# Patient Record
Sex: Male | Born: 1940 | Race: Black or African American | Hispanic: No | Marital: Married | Smoking: Former smoker
Health system: Southern US, Community
[De-identification: ages and names within clinical notes are randomized; demographics above are authoritative.]

## PROBLEM LIST (undated history)

## (undated) DIAGNOSIS — I5022 Chronic systolic (congestive) heart failure: Secondary | ICD-10-CM

## (undated) DIAGNOSIS — M7030 Other bursitis of elbow, unspecified elbow: Secondary | ICD-10-CM

## (undated) DIAGNOSIS — C61 Malignant neoplasm of prostate: Secondary | ICD-10-CM

## (undated) DIAGNOSIS — E669 Obesity, unspecified: Secondary | ICD-10-CM

## (undated) DIAGNOSIS — E119 Type 2 diabetes mellitus without complications: Secondary | ICD-10-CM

## (undated) DIAGNOSIS — I428 Other cardiomyopathies: Secondary | ICD-10-CM

## (undated) DIAGNOSIS — E785 Hyperlipidemia, unspecified: Secondary | ICD-10-CM

## (undated) DIAGNOSIS — I472 Ventricular tachycardia: Secondary | ICD-10-CM

## (undated) DIAGNOSIS — M1990 Unspecified osteoarthritis, unspecified site: Secondary | ICD-10-CM

## (undated) DIAGNOSIS — J984 Other disorders of lung: Secondary | ICD-10-CM

## (undated) DIAGNOSIS — I340 Nonrheumatic mitral (valve) insufficiency: Secondary | ICD-10-CM

## (undated) DIAGNOSIS — Z86718 Personal history of other venous thrombosis and embolism: Secondary | ICD-10-CM

## (undated) DIAGNOSIS — I10 Essential (primary) hypertension: Secondary | ICD-10-CM

## (undated) HISTORY — DX: Malignant neoplasm of prostate: C61

## (undated) HISTORY — DX: Chronic systolic (congestive) heart failure: I50.22

## (undated) HISTORY — DX: Hyperlipidemia, unspecified: E78.5

## (undated) HISTORY — DX: Personal history of other venous thrombosis and embolism: Z86.718

## (undated) HISTORY — PX: TRANSURETHRAL RESECTION OF PROSTATE: SHX73

## (undated) HISTORY — PX: HERNIA REPAIR: SHX51

## (undated) HISTORY — DX: Other cardiomyopathies: I42.8

## (undated) HISTORY — DX: Essential (primary) hypertension: I10

## (undated) HISTORY — DX: Obesity, unspecified: E66.9

## (undated) HISTORY — DX: Nonrheumatic mitral (valve) insufficiency: I34.0

## (undated) HISTORY — DX: Unspecified osteoarthritis, unspecified site: M19.90

## (undated) HISTORY — DX: Other bursitis of elbow, unspecified elbow: M70.30

## (undated) HISTORY — DX: Other disorders of lung: J98.4

---

## 1999-07-19 ENCOUNTER — Encounter: Payer: Self-pay | Admitting: Family Medicine

## 1999-07-19 ENCOUNTER — Encounter: Admission: RE | Admit: 1999-07-19 | Discharge: 1999-07-19 | Payer: Self-pay | Admitting: Family Medicine

## 2006-10-30 ENCOUNTER — Ambulatory Visit: Admission: RE | Admit: 2006-10-30 | Discharge: 2007-01-03 | Payer: Self-pay | Admitting: Radiation Oncology

## 2006-11-18 HISTORY — PX: ROBOT ASSISTED LAPAROSCOPIC RADICAL PROSTATECTOMY: SHX5141

## 2006-12-03 ENCOUNTER — Encounter (INDEPENDENT_AMBULATORY_CARE_PROVIDER_SITE_OTHER): Payer: Self-pay | Admitting: Urology

## 2006-12-03 ENCOUNTER — Inpatient Hospital Stay (HOSPITAL_COMMUNITY): Admission: RE | Admit: 2006-12-03 | Discharge: 2006-12-04 | Payer: Self-pay | Admitting: Urology

## 2007-01-09 ENCOUNTER — Ambulatory Visit: Payer: Self-pay | Admitting: Cardiology

## 2007-01-22 ENCOUNTER — Ambulatory Visit: Payer: Self-pay

## 2007-01-22 ENCOUNTER — Encounter: Payer: Self-pay | Admitting: Cardiology

## 2007-01-25 ENCOUNTER — Ambulatory Visit: Payer: Self-pay | Admitting: Cardiology

## 2007-01-30 ENCOUNTER — Ambulatory Visit: Payer: Self-pay | Admitting: Cardiology

## 2007-01-30 ENCOUNTER — Ambulatory Visit: Payer: Self-pay

## 2007-01-30 LAB — CONVERTED CEMR LAB
Albumin: 3.6 g/dL (ref 3.5–5.2)
GFR calc non Af Amer: 54 mL/min
HDL: 51.7 mg/dL (ref >39.0)
LDL Cholesterol: 83 mg/dL (ref 0–99)
Potassium: 4.1 meq/L (ref 3.5–5.1)
Pro B Natriuretic peptide (BNP): 344 pg/mL — ABNORMAL HIGH (ref 0.0–100.0)
Sodium: 144 meq/L (ref 135–145)
TSH: 1.65 microintl units/mL (ref 0.35–5.50)
Total Bilirubin: 1.9 mg/dL — ABNORMAL HIGH (ref 0.3–1.2)
Triglycerides: 56 mg/dL (ref 0–149)
VLDL: 11 mg/dL (ref 0–40)

## 2007-02-20 ENCOUNTER — Ambulatory Visit: Payer: Self-pay | Admitting: Cardiology

## 2007-03-15 ENCOUNTER — Ambulatory Visit: Payer: Self-pay | Admitting: Cardiology

## 2007-03-15 LAB — CONVERTED CEMR LAB
Chloride: 109 meq/L (ref 96–112)
GFR calc Af Amer: 78 mL/min
GFR calc non Af Amer: 65 mL/min
Sodium: 142 meq/L (ref 135–145)

## 2007-05-29 ENCOUNTER — Ambulatory Visit: Payer: Self-pay | Admitting: Cardiology

## 2007-06-04 ENCOUNTER — Ambulatory Visit: Payer: Self-pay | Admitting: Cardiology

## 2007-06-04 LAB — CONVERTED CEMR LAB
BUN: 26 mg/dL — ABNORMAL HIGH (ref 6–23)
Creatinine, Ser: 1.3 mg/dL (ref 0.4–1.5)
GFR calc Af Amer: 71 mL/min
GFR calc non Af Amer: 59 mL/min
Potassium: 4.5 meq/L (ref 3.5–5.1)

## 2007-09-05 ENCOUNTER — Ambulatory Visit: Payer: Self-pay | Admitting: Cardiology

## 2007-09-05 LAB — CONVERTED CEMR LAB
BUN: 46 mg/dL — ABNORMAL HIGH
CO2: 26 meq/L
Calcium: 9.6 mg/dL
Chloride: 107 meq/L
Creatinine, Ser: 1.6 mg/dL — ABNORMAL HIGH
GFR calc Af Amer: 56 mL/min
GFR calc non Af Amer: 46 mL/min
Glucose, Bld: 117 mg/dL — ABNORMAL HIGH
Potassium: 4.7 meq/L
Sodium: 139 meq/L

## 2008-03-19 ENCOUNTER — Ambulatory Visit: Payer: Self-pay | Admitting: Cardiology

## 2008-03-19 HISTORY — PX: CARDIAC DEFIBRILLATOR PLACEMENT: SHX171

## 2008-03-30 ENCOUNTER — Encounter: Payer: Self-pay | Admitting: Cardiology

## 2008-03-30 ENCOUNTER — Ambulatory Visit: Payer: Self-pay

## 2008-04-10 ENCOUNTER — Ambulatory Visit: Payer: Self-pay | Admitting: Internal Medicine

## 2008-04-15 ENCOUNTER — Ambulatory Visit: Payer: Self-pay | Admitting: Internal Medicine

## 2008-04-15 LAB — CONVERTED CEMR LAB
BUN: 29 mg/dL — ABNORMAL HIGH (ref 6–23)
Chloride: 110 meq/L (ref 96–112)
Eosinophils Relative: 2.7 % (ref 0.0–5.0)
Glucose, Bld: 120 mg/dL — ABNORMAL HIGH (ref 70–99)
INR: 1 (ref 0.8–1.0)
Lymphocytes Relative: 38.9 % (ref 12.0–46.0)
Monocytes Relative: 11.6 % (ref 3.0–12.0)
Neutrophils Relative %: 46.3 % (ref 43.0–77.0)
Platelets: 183 10*3/uL (ref 150–400)
Potassium: 4.4 meq/L (ref 3.5–5.1)
Prothrombin Time: 11.9 s (ref 10.9–13.3)
RDW: 15.3 % — ABNORMAL HIGH (ref 11.5–14.6)
Sodium: 142 meq/L (ref 135–145)
WBC: 4.1 10*3/uL — ABNORMAL LOW (ref 4.5–10.5)

## 2008-04-21 ENCOUNTER — Inpatient Hospital Stay (HOSPITAL_COMMUNITY): Admission: RE | Admit: 2008-04-21 | Discharge: 2008-04-22 | Payer: Self-pay | Admitting: Internal Medicine

## 2008-04-21 ENCOUNTER — Ambulatory Visit: Payer: Self-pay | Admitting: Internal Medicine

## 2008-05-11 ENCOUNTER — Ambulatory Visit: Payer: Self-pay

## 2008-06-19 ENCOUNTER — Ambulatory Visit: Payer: Self-pay | Admitting: Vascular Surgery

## 2008-06-19 ENCOUNTER — Inpatient Hospital Stay (HOSPITAL_COMMUNITY): Admission: EM | Admit: 2008-06-19 | Discharge: 2008-06-22 | Payer: Self-pay | Admitting: Emergency Medicine

## 2008-06-19 ENCOUNTER — Ambulatory Visit: Payer: Self-pay | Admitting: Internal Medicine

## 2008-06-19 ENCOUNTER — Encounter (INDEPENDENT_AMBULATORY_CARE_PROVIDER_SITE_OTHER): Payer: Self-pay | Admitting: Emergency Medicine

## 2008-06-25 ENCOUNTER — Ambulatory Visit: Payer: Self-pay | Admitting: Cardiovascular Disease

## 2008-06-25 LAB — CONVERTED CEMR LAB
INR: 4.4 — ABNORMAL HIGH (ref 0.8–1.0)
Prothrombin Time: 43.7 s — ABNORMAL HIGH (ref 10.9–13.3)

## 2008-06-29 ENCOUNTER — Encounter: Payer: Self-pay | Admitting: Internal Medicine

## 2008-06-29 ENCOUNTER — Ambulatory Visit: Payer: Self-pay

## 2008-06-29 ENCOUNTER — Ambulatory Visit: Payer: Self-pay | Admitting: Cardiology

## 2008-07-03 ENCOUNTER — Ambulatory Visit: Payer: Self-pay | Admitting: Internal Medicine

## 2008-07-06 ENCOUNTER — Ambulatory Visit: Payer: Self-pay | Admitting: Cardiology

## 2008-07-16 ENCOUNTER — Ambulatory Visit: Payer: Self-pay | Admitting: Internal Medicine

## 2008-07-30 ENCOUNTER — Ambulatory Visit: Payer: Self-pay | Admitting: Cardiovascular Disease

## 2008-08-10 ENCOUNTER — Emergency Department (HOSPITAL_COMMUNITY): Admission: EM | Admit: 2008-08-10 | Discharge: 2008-08-10 | Payer: Self-pay | Admitting: Emergency Medicine

## 2008-08-27 ENCOUNTER — Ambulatory Visit: Payer: Self-pay | Admitting: Cardiovascular Disease

## 2008-08-31 ENCOUNTER — Ambulatory Visit: Payer: Self-pay | Admitting: Internal Medicine

## 2008-08-31 DIAGNOSIS — I428 Other cardiomyopathies: Secondary | ICD-10-CM

## 2008-08-31 DIAGNOSIS — Z86718 Personal history of other venous thrombosis and embolism: Secondary | ICD-10-CM | POA: Insufficient documentation

## 2008-08-31 DIAGNOSIS — I10 Essential (primary) hypertension: Secondary | ICD-10-CM

## 2008-08-31 DIAGNOSIS — E785 Hyperlipidemia, unspecified: Secondary | ICD-10-CM

## 2008-08-31 DIAGNOSIS — Z8546 Personal history of malignant neoplasm of prostate: Secondary | ICD-10-CM

## 2008-08-31 DIAGNOSIS — M1990 Unspecified osteoarthritis, unspecified site: Secondary | ICD-10-CM | POA: Insufficient documentation

## 2008-08-31 DIAGNOSIS — I509 Heart failure, unspecified: Secondary | ICD-10-CM | POA: Insufficient documentation

## 2008-09-10 ENCOUNTER — Ambulatory Visit: Payer: Self-pay | Admitting: Cardiology

## 2008-09-24 ENCOUNTER — Ambulatory Visit: Payer: Self-pay | Admitting: Internal Medicine

## 2008-10-01 ENCOUNTER — Ambulatory Visit: Payer: Self-pay | Admitting: Internal Medicine

## 2008-10-02 ENCOUNTER — Telehealth (INDEPENDENT_AMBULATORY_CARE_PROVIDER_SITE_OTHER): Payer: Self-pay | Admitting: *Deleted

## 2008-10-07 DIAGNOSIS — Z95810 Presence of automatic (implantable) cardiac defibrillator: Secondary | ICD-10-CM

## 2008-10-07 DIAGNOSIS — C61 Malignant neoplasm of prostate: Secondary | ICD-10-CM

## 2008-10-07 DIAGNOSIS — E669 Obesity, unspecified: Secondary | ICD-10-CM

## 2008-10-12 ENCOUNTER — Ambulatory Visit: Payer: Self-pay | Admitting: Cardiology

## 2008-10-16 ENCOUNTER — Ambulatory Visit: Payer: Self-pay | Admitting: Internal Medicine

## 2008-11-02 ENCOUNTER — Ambulatory Visit: Payer: Self-pay | Admitting: Internal Medicine

## 2008-11-06 ENCOUNTER — Ambulatory Visit: Payer: Self-pay | Admitting: Internal Medicine

## 2008-11-06 DIAGNOSIS — I472 Ventricular tachycardia: Secondary | ICD-10-CM

## 2008-11-09 ENCOUNTER — Ambulatory Visit: Payer: Self-pay | Admitting: Internal Medicine

## 2008-11-09 LAB — CONVERTED CEMR LAB
ALT: 20 units/L (ref 0–53)
AST: 17 units/L (ref 0–37)
Albumin: 3.6 g/dL (ref 3.5–5.2)
Alkaline Phosphatase: 46 units/L (ref 39–117)
Basophils Relative: 0.4 % (ref 0.0–3.0)
Bilirubin Urine: NEGATIVE
Bilirubin, Direct: 0.1 mg/dL (ref 0.0–0.3)
CO2: 27 meq/L (ref 19–32)
Calcium: 8.9 mg/dL (ref 8.4–10.5)
Chloride: 107 meq/L (ref 96–112)
Creatinine, Ser: 1.4 mg/dL (ref 0.4–1.5)
Creatinine,U: 62.1 mg/dL
Eosinophils Absolute: 0.1 10*3/uL (ref 0.0–0.7)
Eosinophils Relative: 2.2 % (ref 0.0–5.0)
Hemoglobin, Urine: NEGATIVE
Hemoglobin: 12.8 g/dL — ABNORMAL LOW (ref 13.0–17.0)
Hgb A1c MFr Bld: 6.8 % — ABNORMAL HIGH (ref 4.6–6.5)
Ketones, ur: NEGATIVE mg/dL
LDL Cholesterol: 98 mg/dL (ref 0–99)
Lymphocytes Relative: 33.6 % (ref 12.0–46.0)
MCHC: 33.8 g/dL (ref 30.0–36.0)
MCV: 78.2 fL (ref 78.0–100.0)
Microalb Creat Ratio: 1.6 mg/g (ref 0.0–30.0)
Microalb, Ur: 0.1 mg/dL (ref 0.0–1.9)
Neutro Abs: 2.6 10*3/uL (ref 1.4–7.7)
Neutrophils Relative %: 52.3 % (ref 43.0–77.0)
RBC: 4.83 M/uL (ref 4.22–5.81)
Sodium: 141 meq/L (ref 135–145)
Total CHOL/HDL Ratio: 4
Total Protein, Urine: NEGATIVE mg/dL
Total Protein: 6.7 g/dL (ref 6.0–8.3)
Triglycerides: 141 mg/dL (ref 0.0–149.0)
Urine Glucose: NEGATIVE mg/dL
Urobilinogen, UA: 0.2 (ref 0.0–1.0)
WBC: 4.9 10*3/uL (ref 4.5–10.5)

## 2008-11-17 ENCOUNTER — Ambulatory Visit: Payer: Self-pay | Admitting: Cardiology

## 2008-11-17 ENCOUNTER — Encounter: Payer: Self-pay | Admitting: *Deleted

## 2008-11-18 ENCOUNTER — Ambulatory Visit: Payer: Self-pay | Admitting: Internal Medicine

## 2008-11-18 LAB — CONVERTED CEMR LAB
Basophils Absolute: 0.1 10*3/uL (ref 0.0–0.1)
Eosinophils Relative: 2.3 % (ref 0.0–5.0)
HCT: 37.2 % — ABNORMAL LOW (ref 39.0–52.0)
Hemoglobin: 12.1 g/dL — ABNORMAL LOW (ref 13.0–17.0)
Lymphocytes Relative: 29 % (ref 12.0–46.0)
Monocytes Relative: 11.6 % (ref 3.0–12.0)
Neutro Abs: 3.2 10*3/uL (ref 1.4–7.7)
RDW: 17.1 % — ABNORMAL HIGH (ref 11.5–14.6)
Saturation Ratios: 11.3 % — ABNORMAL LOW (ref 20.0–50.0)
Transferrin: 221.3 mg/dL (ref 212.0–360.0)
Vitamin B-12: 468 pg/mL (ref 211–911)
WBC: 5.6 10*3/uL (ref 4.5–10.5)

## 2008-11-23 ENCOUNTER — Encounter: Payer: Self-pay | Admitting: Internal Medicine

## 2008-12-17 ENCOUNTER — Ambulatory Visit: Payer: Self-pay | Admitting: Internal Medicine

## 2008-12-17 DIAGNOSIS — D508 Other iron deficiency anemias: Secondary | ICD-10-CM | POA: Insufficient documentation

## 2008-12-23 ENCOUNTER — Encounter: Payer: Self-pay | Admitting: *Deleted

## 2009-01-26 ENCOUNTER — Telehealth (INDEPENDENT_AMBULATORY_CARE_PROVIDER_SITE_OTHER): Payer: Self-pay | Admitting: *Deleted

## 2009-02-01 ENCOUNTER — Ambulatory Visit: Payer: Self-pay | Admitting: Internal Medicine

## 2009-02-02 ENCOUNTER — Telehealth: Payer: Self-pay | Admitting: Cardiology

## 2009-02-08 ENCOUNTER — Encounter: Payer: Self-pay | Admitting: Internal Medicine

## 2009-03-01 ENCOUNTER — Ambulatory Visit: Payer: Self-pay | Admitting: Internal Medicine

## 2009-03-01 LAB — CONVERTED CEMR LAB
Eosinophils Relative: 3.2 % (ref 0.0–5.0)
HCT: 38.4 % — ABNORMAL LOW (ref 39.0–52.0)
Hemoglobin: 12.6 g/dL — ABNORMAL LOW (ref 13.0–17.0)
Lymphs Abs: 1.5 10*3/uL (ref 0.7–4.0)
MCV: 82.1 fL (ref 78.0–100.0)
Monocytes Absolute: 0.6 10*3/uL (ref 0.1–1.0)
Monocytes Relative: 12.6 % — ABNORMAL HIGH (ref 3.0–12.0)
Neutro Abs: 2.4 10*3/uL (ref 1.4–7.7)
RDW: 17.7 % — ABNORMAL HIGH (ref 11.5–14.6)
WBC: 4.7 10*3/uL (ref 4.5–10.5)

## 2009-03-10 ENCOUNTER — Encounter (INDEPENDENT_AMBULATORY_CARE_PROVIDER_SITE_OTHER): Payer: Self-pay | Admitting: *Deleted

## 2009-05-02 ENCOUNTER — Encounter: Payer: Self-pay | Admitting: Internal Medicine

## 2009-05-03 ENCOUNTER — Ambulatory Visit: Payer: Self-pay | Admitting: Internal Medicine

## 2009-05-28 ENCOUNTER — Ambulatory Visit: Payer: Self-pay | Admitting: Cardiology

## 2009-06-04 ENCOUNTER — Encounter: Payer: Self-pay | Admitting: Internal Medicine

## 2009-06-24 ENCOUNTER — Encounter: Payer: Self-pay | Admitting: Internal Medicine

## 2009-07-05 ENCOUNTER — Ambulatory Visit: Payer: Self-pay | Admitting: Internal Medicine

## 2009-07-05 DIAGNOSIS — M549 Dorsalgia, unspecified: Secondary | ICD-10-CM | POA: Insufficient documentation

## 2009-07-05 LAB — CONVERTED CEMR LAB
ALT: 26 units/L (ref 0–53)
AST: 21 units/L (ref 0–37)
BUN: 20 mg/dL (ref 6–23)
Basophils Relative: 0.8 % (ref 0.0–3.0)
CO2: 24 meq/L (ref 19–32)
Chloride: 110 meq/L (ref 96–112)
Eosinophils Absolute: 0.1 10*3/uL (ref 0.0–0.7)
Eosinophils Relative: 3.5 % (ref 0.0–5.0)
Glucose, Bld: 118 mg/dL — ABNORMAL HIGH (ref 70–99)
Iron: 109 ug/dL (ref 42–165)
Ketones, ur: NEGATIVE mg/dL
Leukocytes, UA: NEGATIVE
Lymphocytes Relative: 38 % (ref 12.0–46.0)
Monocytes Relative: 13.1 % — ABNORMAL HIGH (ref 3.0–12.0)
Neutrophils Relative %: 44.6 % (ref 43.0–77.0)
Nitrite: NEGATIVE
Potassium: 4.3 meq/L (ref 3.5–5.1)
RBC: 5.03 M/uL (ref 4.22–5.81)
Specific Gravity, Urine: 1.02 (ref 1.000–1.030)
TSH: 1.72 microintl units/mL (ref 0.35–5.50)
Total Bilirubin: 1.1 mg/dL (ref 0.3–1.2)
Transferrin: 232.4 mg/dL (ref 212.0–360.0)
Urine Glucose: NEGATIVE mg/dL
Urobilinogen, UA: 0.2 (ref 0.0–1.0)
WBC: 4.2 10*3/uL — ABNORMAL LOW (ref 4.5–10.5)

## 2009-07-07 ENCOUNTER — Encounter: Payer: Self-pay | Admitting: Internal Medicine

## 2009-07-13 LAB — HM DIABETES EYE EXAM: HM Diabetic Eye Exam: NORMAL

## 2009-08-22 ENCOUNTER — Encounter: Payer: Self-pay | Admitting: Internal Medicine

## 2009-08-23 ENCOUNTER — Ambulatory Visit: Payer: Self-pay | Admitting: Internal Medicine

## 2009-08-31 ENCOUNTER — Encounter: Payer: Self-pay | Admitting: Internal Medicine

## 2009-10-05 ENCOUNTER — Ambulatory Visit: Payer: Self-pay | Admitting: Internal Medicine

## 2009-11-01 ENCOUNTER — Ambulatory Visit: Payer: Self-pay | Admitting: Internal Medicine

## 2009-11-18 ENCOUNTER — Ambulatory Visit: Payer: Self-pay | Admitting: Cardiology

## 2010-01-07 ENCOUNTER — Encounter: Payer: Self-pay | Admitting: Internal Medicine

## 2010-02-08 ENCOUNTER — Ambulatory Visit: Payer: Self-pay | Admitting: Internal Medicine

## 2010-02-08 LAB — CONVERTED CEMR LAB
AST: 21 units/L (ref 0–37)
Albumin: 3.9 g/dL (ref 3.5–5.2)
Alkaline Phosphatase: 43 units/L (ref 39–117)
Basophils Relative: 0.5 % (ref 0.0–3.0)
Bilirubin, Direct: 0.1 mg/dL (ref 0.0–0.3)
Calcium: 9 mg/dL (ref 8.4–10.5)
Creatinine, Ser: 1.7 mg/dL — ABNORMAL HIGH (ref 0.4–1.5)
Eosinophils Absolute: 0.1 10*3/uL (ref 0.0–0.7)
Eosinophils Relative: 2.6 % (ref 0.0–5.0)
GFR calc non Af Amer: 50.71 mL/min (ref >60)
HDL: 38.7 mg/dL — ABNORMAL LOW (ref >39.00)
Hemoglobin, Urine: NEGATIVE
Hemoglobin: 12.1 g/dL — ABNORMAL LOW (ref 13.0–17.0)
LDL Cholesterol: 98 mg/dL (ref 0–99)
Lymphocytes Relative: 27.2 % (ref 12.0–46.0)
Microalb, Ur: 0.4 mg/dL (ref 0.0–1.9)
Monocytes Relative: 12.3 % — ABNORMAL HIGH (ref 3.0–12.0)
Neutro Abs: 3.2 10*3/uL (ref 1.4–7.7)
Neutrophils Relative %: 57.4 % (ref 43.0–77.0)
Nitrite: NEGATIVE
RBC: 4.49 M/uL (ref 4.22–5.81)
Sodium: 143 meq/L (ref 135–145)
Specific Gravity, Urine: 1.025 (ref 1.000–1.030)
Total CHOL/HDL Ratio: 4
Total Protein, Urine: NEGATIVE mg/dL
Total Protein: 6.5 g/dL (ref 6.0–8.3)
Triglycerides: 74 mg/dL (ref 0.0–149.0)
Urobilinogen, UA: 0.2 (ref 0.0–1.0)
WBC: 5.7 10*3/uL (ref 4.5–10.5)

## 2010-02-09 ENCOUNTER — Encounter: Payer: Self-pay | Admitting: Internal Medicine

## 2010-02-23 ENCOUNTER — Ambulatory Visit: Payer: Self-pay | Admitting: Internal Medicine

## 2010-02-23 DIAGNOSIS — N182 Chronic kidney disease, stage 2 (mild): Secondary | ICD-10-CM

## 2010-02-23 DIAGNOSIS — E1129 Type 2 diabetes mellitus with other diabetic kidney complication: Secondary | ICD-10-CM

## 2010-04-26 ENCOUNTER — Ambulatory Visit: Payer: Self-pay | Admitting: Internal Medicine

## 2010-04-26 LAB — CONVERTED CEMR LAB
ALT: 23 units/L (ref 0–53)
AST: 21 units/L (ref 0–37)
Alkaline Phosphatase: 44 units/L (ref 39–117)
BUN: 29 mg/dL — ABNORMAL HIGH (ref 6–23)
Bilirubin, Direct: 0.1 mg/dL (ref 0.0–0.3)
Creatinine, Ser: 1.3 mg/dL (ref 0.4–1.5)
Eosinophils Relative: 2.8 % (ref 0.0–5.0)
GFR calc non Af Amer: 69.86 mL/min (ref >60)
Monocytes Relative: 11.6 % (ref 3.0–12.0)
Neutrophils Relative %: 59.5 % (ref 43.0–77.0)
Platelets: 192 10*3/uL (ref 150.0–400.0)
Potassium: 4.5 meq/L (ref 3.5–5.1)
RBC: 4.5 M/uL (ref 4.22–5.81)
Total Bilirubin: 0.7 mg/dL (ref 0.3–1.2)
WBC: 5.8 10*3/uL (ref 4.5–10.5)

## 2010-05-04 ENCOUNTER — Encounter: Payer: Self-pay | Admitting: Internal Medicine

## 2010-05-04 ENCOUNTER — Ambulatory Visit: Payer: Self-pay | Admitting: Internal Medicine

## 2010-05-04 DIAGNOSIS — M25559 Pain in unspecified hip: Secondary | ICD-10-CM | POA: Insufficient documentation

## 2010-05-04 DIAGNOSIS — IMO0002 Reserved for concepts with insufficient information to code with codable children: Secondary | ICD-10-CM | POA: Insufficient documentation

## 2010-05-18 ENCOUNTER — Ambulatory Visit: Payer: Self-pay | Admitting: Internal Medicine

## 2010-06-07 ENCOUNTER — Ambulatory Visit: Payer: Self-pay | Admitting: Cardiology

## 2010-06-07 ENCOUNTER — Encounter: Payer: Self-pay | Admitting: Internal Medicine

## 2010-06-07 ENCOUNTER — Encounter: Payer: Self-pay | Admitting: Cardiology

## 2010-06-29 ENCOUNTER — Telehealth: Payer: Self-pay | Admitting: Cardiology

## 2010-07-09 ENCOUNTER — Encounter: Payer: Self-pay | Admitting: Internal Medicine

## 2010-07-13 ENCOUNTER — Encounter: Payer: Self-pay | Admitting: Internal Medicine

## 2010-07-21 NOTE — Procedures (Signed)
Summary: [facility]   Imported By: Lester Stowell 07/06/2009 08:05:45  _____________________________________________________________________  External Attachment:    Type:   Image     Comment:   External Document

## 2010-07-21 NOTE — Cardiovascular Report (Signed)
Summary: Office Visit   Office Visit   Imported By: Roderic Ovens 06/17/2010 16:17:37  _____________________________________________________________________  External Attachment:    Type:   Image     Comment:   External Document

## 2010-07-21 NOTE — Procedures (Signed)
Summary: [facility]   Allergies: 1)  ! * Tape   ICD Specifications Following MD:  Hillis Range, MD     ICD Vendor:  St Jude     ICD Model Number:  [id number]     ICD Serial Number:  [id number] ICD DOI:  04/21/2008     ICD Implanting MD:  Hillis Range, MD  Lead 1:    Location: RV     DOI: 04/21/2006     Model #: [id number]     Serial #: [id number]     Status: active  Indications::  NICM   ICD Follow Up Remote Check?  No Battery Voltage:  3.20 V     Charge Time:  10.6 seconds     Battery Est. Longevity:  7.3 years Underlying rhythm:  SR ICD Dependent:  No       ICD Device Measurements Right Ventricle:  Amplitude: 12 mV, Impedance: 400 ohms, Threshold: 1.0 V at 0.5 msec  Episodes Coumadin:  Yes Shock:  0     ATP:  0     Nonsustained:  0     Ventricular Pacing:  <1%  Brady Parameters Mode VVI     Lower Rate Limit:  40      Tachy Zones VF:  200     Next Cardiology Appt Due:  08/18/2010 Tech Comments:  No parameter changes.  Device function normal.  No merlin @ this time.  ROV  3 months with Dr. Johney Frame. Altha Harm, LPN  June 07, 2010 11:44 AM

## 2010-07-21 NOTE — Letter (Signed)
Summary: Remote Device Check  [facility], Main Office  [address]   [place], [place] [address]   Phone: [phone]  Fax: [phone]     August 31, 2009 MRN: [id number]   [facility] [address] [place], [place]  [address]   Dear Mr. MAGALLANES,   Your remote transmission was recieved and reviewed by your physician.  All diagnostics were within normal limits for you.    ___X___Your next office visit is scheduled for:   MAY 2011 WITH DR Johney Frame. Please call our office to schedule an appointment.    Sincerely,  Proofreader

## 2010-07-21 NOTE — Assessment & Plan Note (Signed)
Summary: 6 MO F/U ./CY   Visit Type:  6 mo f/u Primary Provider:  Gregor Hams  CC:  edema/ankles once in awhile...denies any other cardiac complaints today.  History of Present Illness: Mr Corron comes in today for his history of primary cardiomyopathy and chronic systolic heart failure.  He's having no symptoms of decompensated failure including orthopnea, PND or edema. He has continued to gain weight which is really a dietary issue. He is up 4 pounds today. His defibrillator has not fired. Functional capacity remains stable.  Current Medications (verified): 1)  Coreg 25 Mg Tabs (Carvedilol) .... Take 1 Tablet By Mouth Two Times A Day 2)  Lasix 20 Mg Tabs (Furosemide) .... Take 1 Tablet By Mouth Once A Day 3)  Digoxin 0.125 Mg Tabs (Digoxin) .... Take 1 Tablet By Mouth Once A Day 4)  Lisinopril 20 Mg Tabs (Lisinopril) .... Take 1 Tablet By Mouth Two Times A Day 5)  Simvastatin 20 Mg Tabs (Simvastatin) .... 1/2 Tab At Bedtime 6)  Aspirin 81 Mg Tbec (Aspirin) .... Take One Tablet By Mouth Daily 7)  Ibuprofen 800 Mg Tabs (Ibuprofen) .... As Needed 8)  Feosol 200 (65 Fe) Mg Tabs (Ferrous Sulfate Dried) .... One By Mouth Two Times A Day With Food 9)  Tradjenta 5 Mg Tabs (Linagliptin) .... One By Mouth Once Daily For Diabetes  Allergies: 1)  ! 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 current DR RF single       chamber implantable cardioverter-defibrillator.   03/21/2008 Hillis Range, MD    s/p hernia repair s/p laparoscopic robotic prostatectomy 6.2008  Family History: Last updated: 10/16/2008 Family History of Prostate CA 1st degree relative  - father Family History of Cardiovascular disorder The patient's father was diagnosed with prostate cancer   in his early 21s.  He died at age 33 and did die of prostate cancer.   The patient's mother died at age 25 due to cardiovascular disease.      Social History: Last updated: 10/07/2008 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  He did smoke for three years off and on, but quit over 30   years ago.  Drinks alcohol only occasionally.      Risk Factors: Alcohol Use: 0 (05/18/2010) Exercise: yes (08/31/2008)  Risk Factors: Smoking Status: quit > 6 months (05/18/2010)  Review of Systems       negative other than history of present illness  Vital Signs:  Patient profile:   70 year old male Height:      69 inches Weight:      224 pounds BMI:     33.20 Pulse rate:   80 / minute Pulse rhythm:   irregular BP sitting:   142 / 80  (left arm) Cuff size:   large  Vitals Entered By: Danielle Rankin, CMA (June 07, 2010 11:32 AM)  Physical Exam  General:  obese.   Head:  normocephalic and atraumatic Eyes:  muddy Neck:  Neck supple, no JVD. No masses, thyromegaly or abnormal cervical nodes. Chest Wall:  no deformities or breast masses noted Lungs:  Clear bilaterally to auscultation and percussion. Heart:  PMI poorly appreciated, normal S1-S2, no gallopcarotids full without bruits Abdomen:  protuberant, good bowel sounds Msk:  decreased ROM.   Pulses:  pulses normal in all 4  extremities Extremities:  No clubbing or cyanosis. Neurologic:  Alert and oriented x 3. Skin:  Intact without lesions or rashes. Psych:  Normal affect.    ICD Specifications Following MD:  Hillis Range, MD     ICD Vendor:  St Jude     ICD Model Number:  [id number]     ICD Serial Number:  [id number] ICD DOI:  04/21/2008     ICD Implanting MD:  Hillis Range, MD  Lead 1:    Location: RV     DOI: 04/21/2006     Model #: [id number]     Serial #: [id number]     Status: active  Indications::  NICM   ICD Follow Up ICD Dependent:  No      Episodes Coumadin:  Yes  Brady Parameters Mode VVI     Lower Rate Limit:  40      Tachy Zones VF:  200     Impression & Recommendations:  Problem # 1:  AUTOMATIC IMPLANTABLE CARDIAC DEFIBRILLATOR SITU (ICD-V45.02) Assessment Unchanged interrogated today and functionally normal  Problem # 2:  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 Take 1 tablet by mouth two times a day    Lisinopril 20 Mg Tabs (Lisinopril) .Marland Kitchen... Take 1 tablet by mouth two times a day    Aspirin 81 Mg Tbec (Aspirin) .Marland Kitchen... Take one tablet by mouth daily  Problem # 3:  OBESITY (ICD-278.00) Assessment: Deteriorated I strongly encouraged him to lose weight as before.  Problem # 4:  HYPERTENSION (ICD-401.9) Assessment: Improved  His updated medication list for this problem includes:    Coreg 25 Mg Tabs (Carvedilol) .Marland Kitchen... Take 1 tablet by mouth two times a day    Lasix 20 Mg Tabs (Furosemide) .Marland Kitchen... Take 1 tablet by mouth once a day    Lisinopril 20 Mg Tabs (Lisinopril) .Marland Kitchen... Take 1 tablet by mouth two times a day    Aspirin 81 Mg Tbec (Aspirin) .Marland Kitchen... Take one tablet by mouth daily  Problem # 5:  CONGESTIVE HEART FAILURE (ICD-428.0) Assessment: Unchanged  His updated medication list for this problem includes:    Coreg 25 Mg Tabs (Carvedilol) .Marland Kitchen... Take 1 tablet by  mouth two times a day    Lasix 20 Mg Tabs (Furosemide) .Marland Kitchen... Take 1 tablet by mouth once a day    Digoxin 0.125 Mg Tabs (Digoxin) .Marland Kitchen... Take 1 tablet by mouth once a day    Lisinopril 20 Mg Tabs (Lisinopril) .Marland Kitchen... Take 1 tablet by mouth two times a day    Aspirin 81 Mg Tbec (Aspirin) .Marland Kitchen... Take one tablet by mouth daily  Orders: EKG w/ Interpretation (93000)  Problem # 6:  OTHER PRIMARY CARDIOMYOPATHIES (ICD-425.4) Assessment: Unchanged  His updated medication list for this problem includes:    Coreg 25 Mg Tabs (Carvedilol) .Marland Kitchen... Take 1 tablet by mouth two times a day    Lasix 20 Mg Tabs (Furosemide) .Marland Kitchen... Take 1 tablet by mouth  once a day    Digoxin 0.125 Mg Tabs (Digoxin) .Marland Kitchen... Take 1 tablet by mouth once a day    Lisinopril 20 Mg Tabs (Lisinopril) .Marland Kitchen... Take 1 tablet by mouth two times a day    Aspirin 81 Mg Tbec (Aspirin) .Marland Kitchen... Take one tablet by mouth daily  Patient Instructions: 1)  Your physician recommends that you schedule a follow-up appointment in: 6 months with Dr. Daleen Squibb  AND  09-12-10 at 10:15 am with Dr. Johney Frame 2)  Your physician recommends that you continue on your current medications as directed. Please refer to the Current Medication list given to you today. 3)  Your physician encouraged you to lose weight for better health.

## 2010-07-21 NOTE — Letter (Signed)
Summary: Generic Letter  [facility]  [address] [place], [place] [address]   Phone: [phone]  Fax: [phone]        04/26/2010  [name] [address] [place], [place]  [address]  To Whom It May Concern:  The above patient is unable to do jury duty due to frequent urination.           Sincerely,   Sanda Linger MD

## 2010-07-21 NOTE — Assessment & Plan Note (Signed)
Summary: 1 mos f/u / #/ cd--rs'd/cd   Vital Signs:  Patient profile:   70 year old male Height:      69 inches Weight:      222 pounds BMI:     32.90 O2 Sat:      98 % on Room air Temp:     120 degrees F oral Pulse rate:   76 / minute Pulse rhythm:   regular Resp:     16 per minute BP sitting:   120 / 80  (left arm) Cuff size:   large  Vitals Entered By: Rock Nephew CMA (October 05, 2009 11:10 AM)  Nutrition Counseling: Patient's BMI is greater than 25 and therefore counseled on weight management options.  O2 Flow:  Room air  Primary Care Provider:  Dr.Tom Yetta Barre   History of Present Illness:  Hypertension Follow-Up      This is a 70 year old man who presents for Hypertension follow-up.  The patient denies lightheadedness, urinary frequency, headaches, and edema.  The patient denies the following associated symptoms: chest pain, chest pressure, exercise intolerance, dyspnea, palpitations, syncope, and leg edema.  Compliance with medications (by patient report) has been near 100%.  The patient reports that dietary compliance has been good.  The patient reports exercising 3-4X per week.    Preventive Screening-Counseling & Management  Alcohol-Tobacco     Alcohol drinks/day: 0     Alcohol Counseling: not indicated; patient does not drink     Smoking Status: never     Year Quit: 1976  Hep-HIV-STD-Contraception     Hepatitis Risk: no risk noted     HIV Risk: no risk noted     STD Risk: no risk noted      Sexual History:  currently monogamous.        Drug Use:  never and no.        Blood Transfusions:  no.    Clinical Review Panels:  Diabetes Management   HgBA1C:  6.9 (07/05/2009)   Creatinine:  1.2 (07/05/2009)  CBC   WBC:  4.2 (07/05/2009)   RBC:  5.03 (07/05/2009)   Hgb:  13.2 (07/05/2009)   Hct:  41.2 (07/05/2009)   Platelets:  162.0 (07/05/2009)   MCV  81.9 (07/05/2009)   MCHC  32.1 (07/05/2009)   RDW  16.2 (07/05/2009)   PMN:  44.6 (07/05/2009)  Lymphs:  38.0 (07/05/2009)   Monos:  13.1 (07/05/2009)   Eosinophils:  3.5 (07/05/2009)   Basophil:  0.8 (07/05/2009)  Complete Metabolic Panel   Glucose:  118 (07/05/2009)   Sodium:  144 (07/05/2009)   Potassium:  4.3 (07/05/2009)   Chloride:  110 (07/05/2009)   CO2:  24 (07/05/2009)   BUN:  20 (07/05/2009)   Creatinine:  1.2 (07/05/2009)   Albumin:  3.8 (07/05/2009)   Total Protein:  7.3 (07/05/2009)   Calcium:  9.0 (07/05/2009)   Total Bili:  1.1 (07/05/2009)   Alk Phos:  45 (07/05/2009)   SGPT (ALT):  26 (07/05/2009)   SGOT (AST):  21 (07/05/2009)   Medications Prior to Update: 1)  Coreg 25 Mg Tabs (Carvedilol) .... Take 1 Tablet By Mouth Two Times A Day 2)  Lasix 20 Mg Tabs (Furosemide) .... Take 1 Tablet By Mouth Once A Day 3)  Digoxin 0.125 Mg Tabs (Digoxin) .... Take 1 Tablet By Mouth Once A Day 4)  Lisinopril 20 Mg Tabs (Lisinopril) .... Take 1 Tablet By Mouth Two Times A Day 5)  Simvastatin 20 Mg Tabs (  Simvastatin) .... 1/2 Tab At Bedtime 6)  Aspirin 81 Mg Tbec (Aspirin) .... Take One Tablet By Mouth Daily 7)  Ibuprofen 800 Mg Tabs (Ibuprofen) .... As Needed 8)  Feosol 200 (65 Fe) Mg Tabs (Ferrous Sulfate Dried) .... One By Mouth Two Times A Day With Food  Current Medications (verified): 1)  Coreg 25 Mg Tabs (Carvedilol) .... Take 1 Tablet By Mouth Two Times A Day 2)  Lasix 20 Mg Tabs (Furosemide) .... Take 1 Tablet By Mouth Once A Day 3)  Digoxin 0.125 Mg Tabs (Digoxin) .... Take 1 Tablet By Mouth Once A Day 4)  Lisinopril 20 Mg Tabs (Lisinopril) .... Take 1 Tablet By Mouth Two Times A Day 5)  Simvastatin 20 Mg Tabs (Simvastatin) .... 1/2 Tab At Bedtime 6)  Aspirin 81 Mg Tbec (Aspirin) .... Take One Tablet By Mouth Daily 7)  Ibuprofen 800 Mg Tabs (Ibuprofen) .... As Needed 8)  Feosol 200 (65 Fe) Mg Tabs (Ferrous Sulfate Dried) .... One By Mouth Two Times A Day With Food  Allergies (verified): 1)  ! * Tape  Past History:  Past Medical History: Reviewed  history from 10/16/2008 and no changes required. 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 Transurethral resection of prostate  Successful implantation of a [employer] current DR RF single       chamber implantable cardioverter-defibrillator.   03/21/2008 Hillis Range, MD    s/p hernia repair s/p laparoscopic robotic prostatectomy 6.2008  Family History: Reviewed history from 10/16/2008 and no changes required. Family History of Prostate CA 1st degree relative  - father Family History of Cardiovascular disorder The patient's father was diagnosed with prostate cancer   in his early 52s.  He died at age 67 and did die of prostate cancer.   The patient's mother died at age 68 due to cardiovascular disease.      Social History: Reviewed history from 10/07/2008 and no changes required. 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  He did smoke for three years off and on, but quit over 30   years ago.  Drinks alcohol only occasionally.    Sexual History:  currently monogamous Drug Use:  never, no Blood Transfusions:  no  Review of Systems       The patient complains of weight gain.  The patient denies anorexia, weight loss, peripheral edema, prolonged cough, abdominal pain, and  depression.   Endo:  Complains of weight change; denies cold intolerance, excessive hunger, excessive thirst, excessive urination, heat intolerance, and polyuria.  Physical Exam  General:  alert, well-developed, well-nourished, well-hydrated, normal appearance, healthy-appearing, cooperative to examination, good hygiene, and overweight-appearing.   Mouth:  Oral mucosa and oropharynx without lesions or exudates.  Teeth in good repair. Neck:  supple, full ROM, no masses, no carotid bruits, no cervical lymphadenopathy, and no neck tenderness.   Lungs:  Normal respiratory effort, chest expands symmetrically. Lungs are clear to auscultation, no crackles or wheezes. Heart:  Normal rate and regular  rhythm. S1 and S2 normal without gallop, murmur, click, rub or other extra sounds. Abdomen:  Bowel sounds positive,abdomen soft and non-tender without masses, organomegaly or hernias noted. Msk:  deformity in his left foot. Pulses:  R and L carotid,radial,femoral,dorsalis pedis and posterior tibial pulses are full and equal bilaterally Extremities:  No clubbing, cyanosis, edema, or deformity noted with normal full range of motion of all joints.   Neurologic:  No cranial nerve deficits noted. Station and gait are normal. Plantar reflexes are down-going bilaterally. DTRs are symmetrical throughout. Sensory, motor and coordinative functions appear intact. Skin:  Intact without suspicious lesions or rashes Psych:  Cognition and judgment appear intact. Alert and cooperative with normal attention span and concentration. No apparent delusions, illusions, hallucinations   Impression & Recommendations:  Problem # 1:  GLUCOSE INTOLERANCE (ICD-271.3) Assessment Unchanged  Problem # 2:  HYPERTENSION (ICD-401.9) Assessment: Improved  His updated medication list for this problem includes:    Coreg 25 Mg Tabs (Carvedilol) .Marland Kitchen... Take 1 tablet by mouth two times a day    Lasix 20 Mg Tabs (Furosemide) .Marland Kitchen... Take 1  tablet by mouth once a day    Lisinopril 20 Mg Tabs (Lisinopril) .Marland Kitchen... Take 1 tablet by mouth two times a day  BP today: 120/80 Prior BP: 118/78 (07/05/2009)  Prior 10 Yr Risk Heart Disease: 7 % (11/18/2008)  Labs Reviewed: K+: 4.3 (07/05/2009) Creat: : 1.2 (07/05/2009)   Chol: 171 (11/09/2008)   HDL: 45.10 (11/09/2008)   LDL: 98 (11/09/2008)   TG: 141.0 (11/09/2008)  Complete Medication List: 1)  Coreg 25 Mg Tabs (Carvedilol) .... Take 1 tablet by mouth two times a day 2)  Lasix 20 Mg Tabs (Furosemide) .... Take 1 tablet by mouth once a day 3)  Digoxin 0.125 Mg Tabs (Digoxin) .... Take 1 tablet by mouth once a day 4)  Lisinopril 20 Mg Tabs (Lisinopril) .... Take 1 tablet by mouth two times a day 5)  Simvastatin 20 Mg Tabs (Simvastatin) .... 1/2 tab at bedtime 6)  Aspirin 81 Mg Tbec (Aspirin) .... Take one tablet by mouth daily 7)  Ibuprofen 800 Mg Tabs (Ibuprofen) .... As needed 8)  Feosol 200 (65 Fe) Mg Tabs (Ferrous sulfate dried) .... One by mouth two times a day with food  Patient Instructions: 1)  Please schedule a follow-up appointment in 4 months. 2)  Check your Blood Pressure regularly. If it is above 130/80: you should make an appointment. 3)  It is important that you exercise regularly at least 20 minutes 5 times a week. If you develop chest pain, have severe difficulty breathing, or feel very tired , stop exercising immediately and seek medical attention. 4)  You need to lose weight. Consider a lower calorie diet and regular exercise.

## 2010-07-21 NOTE — Assessment & Plan Note (Signed)
Summary: HIP PAIN/NWS  #   Vital Signs:  Patient profile:   70 year old male Height:      69 inches Weight:      225 pounds BMI:     33.35 O2 Sat:      98 % on Room air Temp:     98.2 degrees F oral Pulse rate:   91 / minute Pulse rhythm:   regular Resp:     16 per minute BP sitting:   130 / 82  (left arm) Cuff size:   large  Vitals Entered By: Rock Nephew CMA (May 04, 2010 2:54 PM)  Nutrition Counseling: Patient's BMI is greater than 25 and therefore counseled on weight management options.  O2 Flow:  Room air CC: Patient c/o R side hip pain  Does patient need assistance? Functional Status Self care Ambulation Normal   Primary Care Provider:  Dr.Tom Yetta Barre  CC:  Patient c/o R side hip pain.  History of Present Illness: He returns c/o 1 week hx of low back and right hip pain that radiates into his right leg. He gets long preiods of relief with Ibuprofen. He has no hx. of trauma or injury. He has no N/W/T and no B/B incontinence or retention.  Preventive Screening-Counseling & Management  Alcohol-Tobacco     Alcohol drinks/day: 0     Alcohol Counseling: not indicated; patient does not drink     Smoking Status: quit > 6 months     Year Quit: 1976     Tobacco Counseling: to remain off tobacco products  Hep-HIV-STD-Contraception     Hepatitis Risk: no risk noted     HIV Risk: no risk noted     STD Risk: no risk noted      Sexual History:  currently monogamous.        Drug Use:  never and no.        Blood Transfusions:  no.    Clinical Review Panels:  Prevention   Last Colonoscopy:  Hyperplastic Polyp (06/04/2009)   Last PSA:  <0.01 ng/mL (11/09/2008)  Immunizations   Last Tetanus Booster:  Tdap (09/24/2008)   Last Pneumovax:  given (06/20/2007)  Lipid Management   Cholesterol:  151 (02/08/2010)   LDL (bad choesterol):  98 (02/08/2010)   HDL (good cholesterol):  38.70 (02/08/2010)  Diabetes Management   HgBA1C:  6.6 (04/26/2010)   Creatinine:   1.3 (04/26/2010)   Last Dilated Eye Exam:  normal (07/13/2009)   Last Foot Exam:  yes (05/04/2010)   Last Pneumovax:  given (06/20/2007)  CBC   WBC:  5.8 (04/26/2010)   RBC:  4.50 (04/26/2010)   Hgb:  12.5 (04/26/2010)   Hct:  37.6 (04/26/2010)   Platelets:  192.0 (04/26/2010)   MCV  83.5 (04/26/2010)   MCHC  33.3 (04/26/2010)   RDW  15.6 (04/26/2010)   PMN:  59.5 (04/26/2010)   Lymphs:  25.5 (04/26/2010)   Monos:  11.6 (04/26/2010)   Eosinophils:  2.8 (04/26/2010)   Basophil:  0.6 (04/26/2010)  Complete Metabolic Panel   Glucose:  103 (04/26/2010)   Sodium:  143 (04/26/2010)   Potassium:  4.5 (04/26/2010)   Chloride:  108 (04/26/2010)   CO2:  28 (04/26/2010)   BUN:  29 (04/26/2010)   Creatinine:  1.3 (04/26/2010)   Albumin:  3.7 (04/26/2010)   Total Protein:  6.3 (04/26/2010)   Calcium:  9.0 (04/26/2010)   Total Bili:  0.7 (04/26/2010)   Alk Phos:  44 (04/26/2010)   SGPT (  ALT):  23 (04/26/2010)   SGOT (AST):  21 (04/26/2010)   Medications Prior to Update: 1)  Coreg 25 Mg Tabs (Carvedilol) .... Take 1 Tablet By Mouth Two Times A Day 2)  Lasix 20 Mg Tabs (Furosemide) .... Take 1 Tablet By Mouth Once A Day 3)  Digoxin 0.125 Mg Tabs (Digoxin) .... Take 1 Tablet By Mouth Once A Day 4)  Lisinopril 20 Mg Tabs (Lisinopril) .... Take 1 Tablet By Mouth Two Times A Day 5)  Simvastatin 20 Mg Tabs (Simvastatin) .... 1/2 Tab At Bedtime 6)  Aspirin 81 Mg Tbec (Aspirin) .... Take One Tablet By Mouth Daily 7)  Ibuprofen 800 Mg Tabs (Ibuprofen) .... As Needed 8)  Feosol 200 (65 Fe) Mg Tabs (Ferrous Sulfate Dried) .... One By Mouth Two Times A Day With Food 9)  Tradjenta 5 Mg Tabs (Linagliptin) .... One By Mouth Once Daily For Diabetes  Current Medications (verified): 1)  Coreg 25 Mg Tabs (Carvedilol) .... Take 1 Tablet By Mouth Two Times A Day 2)  Lasix 20 Mg Tabs (Furosemide) .... Take 1 Tablet By Mouth Once A Day 3)  Digoxin 0.125 Mg Tabs (Digoxin) .... Take 1 Tablet By Mouth Once  A Day 4)  Lisinopril 20 Mg Tabs (Lisinopril) .... Take 1 Tablet By Mouth Two Times A Day 5)  Simvastatin 20 Mg Tabs (Simvastatin) .... 1/2 Tab At Bedtime 6)  Aspirin 81 Mg Tbec (Aspirin) .... Take One Tablet By Mouth Daily 7)  Ibuprofen 800 Mg Tabs (Ibuprofen) .... As Needed 8)  Feosol 200 (65 Fe) Mg Tabs (Ferrous Sulfate Dried) .... One By Mouth Two Times A Day With Food 9)  Tradjenta 5 Mg Tabs (Linagliptin) .... One By Mouth Once Daily For Diabetes  Allergies (verified): 1)  ! 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 current DR RF single       chamber implantable cardioverter-defibrillator.   03/21/2008 Hillis Range, MD    s/p hernia repair s/p laparoscopic robotic prostatectomy 6.2008  Family History: Last updated: 10/16/2008 Family History of Prostate CA 1st degree relative  - father Family History of Cardiovascular disorder The patient's father was diagnosed with prostate cancer   in his early 31s.  He died at age 24 and did die of prostate cancer.   The patient's mother died at age 42 due to cardiovascular disease.      Social History: Last updated: 10/07/2008 Retired Married Alcohol use-no Drug use-no Regular exercise-yes The patient works  as a Paramedic.  He is currently   married.  He did smoke for three years off and on, but quit over 30   years ago.  Drinks alcohol only occasionally.      Risk Factors: Alcohol Use: 0 (05/04/2010) Exercise: yes (08/31/2008)  Risk Factors: Smoking Status: quit > 6 months (05/04/2010)  Family History: Reviewed history from 10/16/2008 and no changes required. Family History of Prostate CA 1st degree relative  - father Family History of  Cardiovascular disorder The patient's father was diagnosed with prostate cancer   in his early 68s.  He died at age 84 and did die of prostate cancer.   The patient's mother died at age 32 due to cardiovascular disease.      Social History: Reviewed history from 10/07/2008 and no changes required. 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  He did smoke for three years off and on, but quit over 30   years ago.  Drinks alcohol only occasionally.      Review of Systems  The patient denies anorexia, fever, weight loss, chest pain, syncope, dyspnea on exertion, peripheral edema, prolonged cough, headaches, hemoptysis, abdominal pain, hematuria, muscle weakness, suspicious skin lesions, and enlarged lymph nodes.   MS:  Complains of joint pain and low back pain; denies joint redness, joint swelling, loss of strength, cramps, muscle weakness, stiffness, and thoracic pain.  Physical Exam  General:  alert, well-developed, well-nourished, well-hydrated, normal appearance, healthy-appearing, cooperative to examination, good hygiene, and overweight-appearing.   Mouth:  Oral mucosa and oropharynx without lesions or exudates.  Teeth in good repair. Neck:  supple, full ROM, no masses, no carotid bruits, no cervical lymphadenopathy, and no neck tenderness.   Lungs:  Normal respiratory effort, chest expands symmetrically. Lungs are clear to auscultation, no crackles or  wheezes. Heart:  Normal rate and regular rhythm. S1 and S2 normal without gallop, murmur, click, rub or other extra sounds. Abdomen:  Bowel sounds positive,abdomen soft and non-tender without masses, organomegaly or hernias noted. Msk:  normal ROM, no joint tenderness, no joint swelling, no joint warmth, no redness over joints, no joint deformities, no joint instability, no crepitation, and no muscle atrophy.   Pulses:  R and L carotid,radial,femoral,dorsalis pedis and posterior tibial pulses are full and equal bilaterally Extremities:  No clubbing, cyanosis, edema, or deformity noted with normal full range of motion of all joints.   Neurologic:  No cranial nerve deficits noted. Station and gait are normal. Plantar reflexes are down-going bilaterally. DTRs are symmetrical throughout. Sensory, motor and coordinative functions appear intact. Skin:  Intact without suspicious lesions or rashes Cervical Nodes:  no anterior cervical adenopathy and no posterior cervical adenopathy.   Psych:  Cognition and judgment appear intact. Alert and cooperative with normal attention span and concentration. No apparent delusions, illusions, hallucinations  Diabetes Management Exam:    Foot Exam (with socks and/or shoes not present):       Sensory-Pinprick/Light touch:          Left medial foot (L-4): normal          Left dorsal foot (L-5): normal          Left lateral foot (S-1): normal          Right medial foot (L-4): normal          Right dorsal foot (L-5): normal          Right lateral foot (S-1): normal       Sensory-Monofilament:          Left foot: normal          Right foot: normal       Inspection:          Left foot: normal          Right foot: normal       Nails:          Left  foot: normal          Right foot: normal   Detailed Back/Spine Exam  Lumbosacral Exam:  Inspection-deformity:    Normal Palpation-spinal tenderness:  Normal Range of Motion:    Forward Flexion:   85 degrees     Hyperextension:   30 degrees    Right Lateral Bend:   30 degrees    Left Lateral Bend:   30 degrees Squatting:  normal Lying Straight Leg Raise:    Right:  positive    Left:  negative Sitting Straight Leg Raise:    Right:  positive    Left:  negative Reverse Straight Leg Raise:    Right:  negative    Left:  negative Contralateral Straight Leg Raise:    Right:  negative Sciatic Notch:    There is no sciatic notch tenderness. Toe Walking:    Right:  normal    Left:  normal Heel Walking:    Right:  normal    Left:  normal   Impression & Recommendations:  Problem # 1:  LUMBAR RADICULOPATHY (ICD-724.4) Assessment New  His updated medication list for this problem includes:    Aspirin 81 Mg Tbec (Aspirin) .Marland Kitchen... Take one tablet by mouth daily    Ibuprofen 800 Mg Tabs (Ibuprofen) .Marland Kitchen... As needed  Orders: Radiology Referral (Radiology)  Problem # 2:  HIP PAIN, RIGHT (ICD-719.45) Assessment: New xray shows djd His updated medication list for this problem includes:    Aspirin 81 Mg Tbec (Aspirin) .Marland Kitchen... Take one tablet by mouth daily    Ibuprofen 800 Mg Tabs (Ibuprofen) .Marland Kitchen... As needed  Orders: T-Hip Comp Right Min 2 views (73510TC)  Problem # 3:  BACK PAIN (ICD-724.5) Assessment: Deteriorated xray shows djd and spondylosis= will check an MRI His updated medication list for this problem includes:    Aspirin 81 Mg Tbec (Aspirin) .Marland Kitchen... Take one tablet by mouth daily    Ibuprofen 800 Mg Tabs (Ibuprofen) .Marland Kitchen... As needed  Orders: T-Lumbar Spine Complete, 5 Views [phone]) Radiology Referral (Radiology)  Discussed use of moist heat or ice, modified activities, medications, and stretching/strengthening exercises. Back care instructions given. To be seen in 2 weeks if no improvement; sooner if worsening of symptoms.   Complete Medication List: 1)  Coreg 25 Mg Tabs (Carvedilol) .... Take 1 tablet by mouth two times a day 2)  Lasix 20 Mg Tabs (Furosemide) .... Take 1 tablet by  mouth once a day 3)  Digoxin 0.125 Mg Tabs (Digoxin) .... Take 1 tablet by mouth once a day 4)  Lisinopril 20 Mg Tabs (Lisinopril) .... Take 1 tablet by mouth two times a day 5)  Simvastatin 20 Mg Tabs (Simvastatin) .... 1/2 tab at bedtime 6)  Aspirin 81 Mg Tbec (Aspirin) .... Take one tablet by mouth daily 7)  Ibuprofen 800 Mg Tabs (Ibuprofen) .... As needed 8)  Feosol 200 (65 Fe) Mg Tabs (Ferrous sulfate dried) .... One by mouth two times a day with food 9)  Tradjenta 5 Mg Tabs (Linagliptin) .... One by mouth once daily for diabetes  Patient Instructions: 1)  Please schedule a follow-up appointment in 2 weeks. 2)  Take 650-1000mg  of Tylenol every 4-6 hours as needed for relief of pain or comfort of fever AVOID taking more than 4000mg   in a 24 hour period (can cause liver damage in higher doses).   Orders Added: 1)  T-Hip Comp Right Min 2 views [73510TC] 2)  T-Lumbar Spine Complete, 5 Views [71110TC] 3)  Radiology Referral [  Radiology] 4)  Est. Patient Level IV [96045]

## 2010-07-21 NOTE — Cardiovascular Report (Signed)
Summary: Office Visit Remote   Office Visit Remote   Imported By: Roderic Ovens 09/01/2009 11:03:28  _____________________________________________________________________  External Attachment:    Type:   Image     Comment:   External Document

## 2010-07-21 NOTE — Letter (Signed)
Summary: Results Follow-up Letter  [facility]  [address] [place], [place] [address]   Phone: [phone]  Fax: [phone]    07/07/2009  [address] [place], [place]  [address]  Dear Mr. MAYEDA,   The following are the results of your recent test(s):  Test     Result     CBC       normal Iron       normal Liver/kidney   normal Blood sugars   slightly elevated Urine       normal _________________________________________________________  Please call for an appointment in 2-3 months _________________________________________________________ _________________________________________________________ _________________________________________________________  Sincerely,  Sanda Linger MD [facility]

## 2010-07-21 NOTE — Progress Notes (Signed)
Summary: device making noise  Phone Note Call from Patient Call back at Home Phone [phone]   Caller: Patient Reason for Call: Talk to Nurse Summary of Call: pt states his device is going off about now. pt wants to talk to a nurse. Initial call taken by: Roe Coombs,  June 29, 2010 9:51 AM  Follow-up for Phone Call        spoke w/pt---pt unplugged transmitter and plugged back up. per pt working just fine now. Vella Kohler  June 29, 2010 1:09 PM

## 2010-07-21 NOTE — Assessment & Plan Note (Signed)
Summary: 4 MO ROV/NWS  #   Vital Signs:  Patient profile:   70 year old male Height:      69 inches Weight:      223.25 pounds BMI:     33.09 O2 Sat:      93 % on Room air Temp:     97.5 degrees F oral Pulse rate:   71 / minute Pulse rhythm:   regular Resp:     16 per minute BP sitting:   98 / 60  (left arm) Cuff size:   large  Vitals Entered By: Rock Nephew CMA (February 08, 2010 10:25 AM)  Nutrition Counseling: Patient's BMI is greater than 25 and therefore counseled on weight management options.  O2 Flow:  Room air CC: follow-up visit Is Patient Diabetic? No Pain Assessment Patient in pain? no       Does patient need assistance? Functional Status Self care Ambulation Normal   Primary Care Provider:  Dr.Tom Yetta Barre  CC:  follow-up visit.  History of Present Illness:  Follow-Up Visit      This is a 70 year old man who presents for Follow-up visit.  The patient denies chest pain, palpitations, dizziness, syncope, low blood sugar symptoms, high blood sugar symptoms, edema, SOB, DOE, PND, and orthopnea.  Since the last visit the patient notes no new problems or concerns.  The patient reports taking meds as prescribed, monitoring BP, and dietary noncompliance.  When questioned about possible medication side effects, the patient notes none.    Preventive Screening-Counseling & Management  Alcohol-Tobacco     Alcohol drinks/day: 0     Alcohol Counseling: not indicated; patient does not drink     Smoking Status: quit > 6 months     Year Quit: 1976     Tobacco Counseling: to remain off tobacco products  Hep-HIV-STD-Contraception     Hepatitis Risk: no risk noted     HIV Risk: no risk noted     STD Risk: no risk noted      Sexual History:  currently monogamous.        Drug Use:  never and no.        Blood Transfusions:  no.    Allergies: 1)  ! 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 current DR RF single       chamber implantable cardioverter-defibrillator.   03/21/2008 Hillis Range, MD    s/p hernia repair s/p laparoscopic robotic prostatectomy 6.2008  Family History: Last updated: 10/16/2008 Family History of Prostate CA 1st degree relative  - father Family History of Cardiovascular disorder The patient's father was diagnosed with prostate cancer   in his early 69s.  He died at age 69 and did die of prostate cancer.   The patient's mother died at age 69 due to cardiovascular disease.      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  He did smoke for three years off and on, but quit over 30   years ago.  Drinks alcohol only occasionally.      Risk Factors: Alcohol Use: 0 (02/08/2010) Exercise: yes (08/31/2008)  Risk Factors: Smoking Status: quit > 6 months (02/08/2010)  Family History: Reviewed history from 10/16/2008 and no changes required. Family History of Prostate CA 1st degree relative  - father Family History of Cardiovascular disorder The patient's father was diagnosed with prostate cancer   in his early 69s.  He died at age 69 and did die of prostate cancer.    The patient's mother died at age 69 due to cardiovascular disease.      Social History: Reviewed history from 10/07/2008 and no changes required. 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  He did smoke for three years off and on, but quit over 30   years ago.  Drinks alcohol only occasionally.    Smoking Status:  quit > 6 months  Review of Systems       The patient complains of weight gain.  The patient denies anorexia, fever, weight loss, chest pain, syncope, dyspnea on exertion, peripheral edema, prolonged cough, headaches, hemoptysis, abdominal pain, hematuria, suspicious skin lesions, difficulty walking, abnormal bleeding, and enlarged lymph nodes.   Heme:  Denies abnormal bruising, bleeding, enlarge lymph nodes, fevers, pallor, and skin discoloration.  Physical Exam  General:  alert, well-developed, well-nourished, well-hydrated, normal appearance, healthy-appearing, cooperative to examination, good hygiene, and overweight-appearing.   Head:  normocephalic and atraumatic.   Mouth:  Oral mucosa and oropharynx without lesions or exudates.  Teeth in good repair. Neck:  supple, full ROM, no masses, no carotid bruits, no cervical lymphadenopathy, and no neck tenderness.   Lungs:  Normal respiratory effort, chest expands symmetrically. Lungs are clear to auscultation, no crackles or wheezes. Heart:  Normal rate and regular rhythm. S1 and S2 normal without gallop, murmur, click, rub or other extra sounds. Abdomen:  Bowel sounds positive,abdomen soft and non-tender without masses, organomegaly or hernias noted. Msk:  deformity in his left foot. Pulses:  R and L carotid,radial,femoral,dorsalis pedis and posterior tibial pulses are full and equal bilaterally Extremities:  No clubbing, cyanosis, edema, or deformity noted with normal full range of motion of all joints.   Neurologic:  No cranial nerve  deficits noted. Station and gait are normal. Plantar reflexes are down-going bilaterally. DTRs are symmetrical throughout. Sensory, motor and coordinative functions appear intact. Skin:  Intact without suspicious lesions or rashes Cervical Nodes:  no anterior cervical adenopathy and no posterior cervical adenopathy.   Axillary Nodes:  no R axillary adenopathy and no L axillary adenopathy.   Inguinal Nodes:  no R inguinal adenopathy and no L inguinal adenopathy.   Psych:  Cognition and judgment appear intact. Alert and cooperative with normal attention span and concentration. No apparent delusions, illusions, hallucinations  Diabetes Management Exam:    Eye Exam:       Eye Exam done elsewhere          Date: 07/13/2009          Results: normal          [facility] by[facility]   Impression & Recommendations:  Problem # 1:  OTHER SPECIFIED IRON DEFICIENCY ANEMIAS (ICD-280.8)  His updated medication list for this problem includes:    Feosol 200 (  65 Fe) Mg Tabs (Ferrous sulfate dried) ..... One by mouth two times a day with food  Orders: Venipuncture (98119) TLB-Lipid Panel (80061-LIPID) TLB-BMP (Basic Metabolic Panel-BMET) (80048-METABOL) TLB-CBC Platelet - w/Differential (85025-CBCD) TLB-Hepatic/Liver Function Pnl (80076-HEPATIC) TLB-TSH (Thyroid Stimulating Hormone) (84443-TSH) TLB-A1C / Hgb A1C (Glycohemoglobin) (83036-A1C) TLB-Microalbumin/Creat Ratio, Urine (82043-MALB) TLB-Udip w/ Micro (81001-URINE)  Problem # 2:  GLUCOSE INTOLERANCE (ICD-271.3) Assessment: Unchanged  Orders: Venipuncture (14782) TLB-Lipid Panel (80061-LIPID) TLB-BMP (Basic Metabolic Panel-BMET) (80048-METABOL) TLB-CBC Platelet - w/Differential (85025-CBCD) TLB-Hepatic/Liver Function Pnl (80076-HEPATIC) TLB-TSH (Thyroid Stimulating Hormone) (84443-TSH) TLB-A1C / Hgb A1C (Glycohemoglobin) (83036-A1C) TLB-Microalbumin/Creat Ratio, Urine (82043-MALB) TLB-Udip w/ Micro (81001-URINE)  Complete Medication List: 1)   Coreg 25 Mg Tabs (Carvedilol) .... Take 1 tablet by mouth two times a day 2)  Lasix 20 Mg Tabs (Furosemide) .... Take 1 tablet by mouth once a day 3)  Digoxin 0.125 Mg Tabs (Digoxin) .... Take 1 tablet by mouth once a day 4)  Lisinopril 20 Mg Tabs (Lisinopril) .... Take 1 tablet by mouth two times a day 5)  Simvastatin 20 Mg Tabs (Simvastatin) .... 1/2 tab at bedtime 6)  Aspirin 81 Mg Tbec (Aspirin) .... Take one tablet by mouth daily 7)  Ibuprofen 800 Mg Tabs (Ibuprofen) .... As needed 8)  Feosol 200 (65 Fe) Mg Tabs (Ferrous sulfate dried) .... One by mouth two times a day with food 9)  Spironolactone 25 Mg Tabs (Spironolactone) .Marland Kitchen.. 1 once daily  PSA Screening:    PSA: <0.01 ng/mL  (11/09/2008)    Reviewed PSA screening recommendations: patient refuses PSA understanding risk of delayed diagnosis-dr borden does  Immunization & Chemoprophylaxis:    Tetanus vaccine: Tdap  (09/24/2008)    Pneumovax: given  (06/20/2007)  Patient Instructions: 1)  Please schedule a follow-up appointment in 3 months. 2)  It is important that you exercise regularly at least 20 minutes 5 times a week. If you develop chest pain, have severe difficulty breathing, or feel very tired , stop exercising immediately and seek medical attention. 3)  You need to lose weight. Consider a lower calorie diet and regular exercise.  4)  Check your blood sugars regularly. If your readings are usually above 200 or below 70 you should contact our office. 5)  It is important that your Diabetic A1c level is checked every 3 months. 6)  See your eye doctor yearly to check for diabetic eye damage. 7)  Check your feet each night for sore areas, calluses or signs of infection. 8)  Check your Blood Pressure regularly. If it is above 130/80: you should make an appointment.  Preventive Care Screening  Last Pneumovax:    Date:  06/20/2007    Results:  given     Not Administered:    Influenza Vaccine not given due to: declined

## 2010-07-21 NOTE — Letter (Signed)
Summary: Results Follow-up Letter  [facility]  [address] [place], [place] [address]   Phone: [phone]  Fax: [phone]    05/04/2010  [address] [place], [place]  [address]  Dear Mr. TIPPETT,   The following are the results of your recent test(s):  Test       Result     hip and back xrays   arthritis   _________________________________________________________  Please call for an appointment soon _________________________________________________________ _________________________________________________________ _________________________________________________________  Sincerely,  Sanda Linger MD [facility]

## 2010-07-21 NOTE — Letter (Signed)
Summary: Results Follow-up Letter  [facility]  [address] [place], [place] [address]   Phone: [phone]  Fax: [phone]    02/09/2010  [address] [place], [place]  [address]  Dear Mr. SCHNITZLER,   The following are the results of your recent test(s):  Test     Result     Kidney     mild dysfunction, mildly dehydrated Potassium     high CBC       anemia Liver       normal Blood sugars   acceptable  _________________________________________________________  Please call for an appointment in 1-2 weeks, you may have to stop taking Spironolactone  _________________________________________________________ _________________________________________________________ _________________________________________________________  Sincerely,  Sanda Linger MD [facility]

## 2010-07-21 NOTE — Assessment & Plan Note (Signed)
Summary: FU Natale Milch  #--Rm 9   Vital Signs:  Patient profile:   70 year old male Height:      69 inches Weight:      224.05 pounds BMI:     33.21 O2 Sat:      98 % on Room air Temp:     96.7 degrees F oral Pulse rate:   84 / minute Pulse rhythm:   regular Resp:     16 per minute BP sitting:   100 / 70  (left arm) Cuff size:   large  Vitals Entered By: Mervin Kung CMA Duncan Dull) (February 23, 2010 9:51 AM)  Nutrition Counseling: Patient's BMI is greater than 25 and therefore counseled on weight management options.  O2 Flow:  Room air CC: Rm 9  Follow up of labs. Is Patient Diabetic? No   Primary Care Provider:  Gregor Hams  CC:  Rm 9  Follow up of labs..  History of Present Illness:  Follow-Up Visit      This is a 70 year old man who presents for Follow-up visit.  The patient denies chest pain, palpitations, dizziness, syncope, edema, SOB, DOE, PND, and orthopnea.  Since the last visit the patient notes no new problems or concerns.  The patient reports taking meds as prescribed, monitoring BP, and dietary compliance.  When questioned about possible medication side effects, the patient notes none.    Preventive Screening-Counseling & Management  Alcohol-Tobacco     Alcohol drinks/day: 0     Alcohol Counseling: not indicated; patient does not drink     Smoking Status: quit > 6 months     Year Quit: 1976     Tobacco Counseling: to remain off tobacco products  Hep-HIV-STD-Contraception     Hepatitis Risk: no risk noted     HIV Risk: no risk noted     STD Risk: no risk noted      Sexual History:  currently monogamous.        Drug Use:  never and no.        Blood Transfusions:  no.    Clinical Review Panels:  Lipid Management   Cholesterol:  151 (02/08/2010)   LDL (bad choesterol):  98 (02/08/2010)   HDL (good cholesterol):  38.70 (02/08/2010)  Diabetes Management   HgBA1C:  7.3 (02/08/2010)   Creatinine:  1.7 (02/08/2010)   Last Dilated Eye Exam:  normal  (07/13/2009)   Last Foot Exam:  yes (02/23/2010)   Last Pneumovax:  given (06/20/2007)  CBC   WBC:  5.7 (02/08/2010)   RBC:  4.49 (02/08/2010)   Hgb:  12.1 (02/08/2010)   Hct:  36.2 (02/08/2010)   Platelets:  172.0 (02/08/2010)   MCV  80.7 (02/08/2010)   MCHC  33.5 (02/08/2010)   RDW  16.8 (02/08/2010)   PMN:  57.4 (02/08/2010)   Lymphs:  27.2 (02/08/2010)   Monos:  12.3 (02/08/2010)   Eosinophils:  2.6 (02/08/2010)   Basophil:  0.5 (02/08/2010)  Complete Metabolic Panel   Glucose:  116 (02/08/2010)   Sodium:  143 (02/08/2010)   Potassium:  5.4 (02/08/2010)   Chloride:  111 (02/08/2010)   CO2:  25 (02/08/2010)   BUN:  39 (02/08/2010)   Creatinine:  1.7 (02/08/2010)   Albumin:  3.9 (02/08/2010)   Total Protein:  6.5 (02/08/2010)   Calcium:  9.0 (02/08/2010)   Total Bili:  0.5 (02/08/2010)   Alk Phos:  43 (02/08/2010)   SGPT (ALT):  23 (02/08/2010)   SGOT (  AST):  21 (02/08/2010)   Medications Prior to Update: 1)  Coreg 25 Mg Tabs (Carvedilol) .... Take 1 Tablet By Mouth Two Times A Day 2)  Lasix 20 Mg Tabs (Furosemide) .... Take 1 Tablet By Mouth Once A Day 3)  Digoxin 0.125 Mg Tabs (Digoxin) .... Take 1 Tablet By Mouth Once A Day 4)  Lisinopril 20 Mg Tabs (Lisinopril) .... Take 1 Tablet By Mouth Two Times A Day 5)  Simvastatin 20 Mg Tabs (Simvastatin) .... 1/2 Tab At Bedtime 6)  Aspirin 81 Mg Tbec (Aspirin) .... Take One Tablet By Mouth Daily 7)  Ibuprofen 800 Mg Tabs (Ibuprofen) .... As Needed 8)  Feosol 200 (65 Fe) Mg Tabs (Ferrous Sulfate Dried) .... One By Mouth Two Times A Day With Food 9)  Spironolactone 25 Mg Tabs (Spironolactone) .Marland Kitchen.. 1 Once Daily  Current Medications (verified): 1)  Coreg 25 Mg Tabs (Carvedilol) .... Take 1 Tablet By Mouth Two Times A Day 2)  Lasix 20 Mg Tabs (Furosemide) .... Take 1 Tablet By Mouth Once A Day 3)  Digoxin 0.125 Mg Tabs (Digoxin) .... Take 1 Tablet By Mouth Once A Day 4)  Lisinopril 20 Mg Tabs (Lisinopril) .... Take 1 Tablet  By Mouth Two Times A Day 5)  Simvastatin 20 Mg Tabs (Simvastatin) .... 1/2 Tab At Bedtime 6)  Aspirin 81 Mg Tbec (Aspirin) .... Take One Tablet By Mouth Daily 7)  Ibuprofen 800 Mg Tabs (Ibuprofen) .... As Needed 8)  Feosol 200 (65 Fe) Mg Tabs (Ferrous Sulfate Dried) .... One By Mouth Two Times A Day With Food  Allergies: 1)  ! 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 current DR RF single       chamber implantable cardioverter-defibrillator.   03/21/2008 Hillis Range, MD    s/p hernia repair s/p laparoscopic robotic prostatectomy 6.2008  Family History: Last updated: 10/16/2008 Family History of Prostate CA 1st degree relative  - father Family History of Cardiovascular disorder The patient's father was diagnosed with prostate cancer   in his early 40s.  He died at age 59 and did die of prostate cancer.   The patient's mother died at age 52 due to cardiovascular disease.      Social History: Last updated: 10/07/2008 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  He did smoke for three years off and on, but quit over 30   years ago.  Drinks alcohol only occasionally.        Risk Factors: Alcohol Use: 0 (02/23/2010) Exercise: yes (08/31/2008)  Risk Factors: Smoking Status: quit > 6 months (02/23/2010)  Family History: Reviewed history from 10/16/2008 and no changes required. Family History of Prostate CA 1st degree relative  - father Family History of Cardiovascular disorder The patient's father was diagnosed with prostate cancer   in his early 45s.  He died at age 16 and did die of  prostate cancer.   The patient's mother died at age 86 due to cardiovascular disease.      Social History: Reviewed history from 10/07/2008 and no changes required. 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  He did smoke for three years off and on, but quit over 30   years ago.  Drinks alcohol only occasionally.      Review of Systems  The patient denies anorexia, fever, weight loss, weight gain, chest pain, syncope, dyspnea on exertion, peripheral edema, prolonged cough, headaches, hemoptysis, abdominal pain, hematuria, suspicious skin lesions, and enlarged lymph nodes.   Endo:  Denies cold intolerance, excessive hunger, excessive thirst, excessive urination, heat intolerance, polyuria, and weight change.  Physical Exam  General:  alert, well-developed, well-nourished, well-hydrated, normal appearance, healthy-appearing, cooperative to examination, good hygiene, and overweight-appearing.   Head:  normocephalic, atraumatic, no abnormalities observed, and no abnormalities palpated.   Mouth:  Oral mucosa and oropharynx without lesions or exudates.  Teeth in good repair. Neck:  supple, full ROM, no masses, no carotid bruits, no cervical lymphadenopathy, and no neck tenderness.   Lungs:  Normal respiratory effort, chest expands symmetrically. Lungs are clear to auscultation, no crackles or wheezes. Heart:  Normal rate and regular rhythm. S1 and S2 normal without gallop, murmur,  click, rub or other extra sounds. Abdomen:  Bowel sounds positive,abdomen soft and non-tender without masses, organomegaly or hernias noted. Msk:  deformity in his left foot. Pulses:  R and L carotid,radial,femoral,dorsalis pedis and posterior tibial pulses are full and equal bilaterally Extremities:  No clubbing, cyanosis, edema, or deformity noted with normal full range of motion of all joints.   Neurologic:  No cranial nerve deficits noted. Station and gait are normal. Plantar reflexes are down-going bilaterally. DTRs are symmetrical throughout. Sensory, motor and coordinative functions appear intact. Skin:  Intact without suspicious lesions or rashes Psych:  Cognition and judgment appear intact. Alert and cooperative with normal attention span and concentration. No apparent delusions, illusions, hallucinations  Diabetes Management Exam:    Foot Exam (with socks and/or shoes not present):       Sensory-Pinprick/Light touch:          Left medial foot (L-4): normal          Left dorsal foot (L-5): normal          Left lateral foot (S-1): normal          Right medial foot (L-4): normal          Right dorsal foot (L-5): normal          Right lateral foot (S-1): normal       Sensory-Monofilament:          Left foot: normal          Right foot: normal       Inspection:          Left foot: normal          Right foot: normal       Nails:          Left foot: normal          Right foot: normal   Impression & Recommendations:  Problem # 1:  CONGESTIVE HEART FAILURE (ICD-428.0) Assessment Unchanged will stop spironolactone due to high K+/BUN/Creatinine The following medications were removed from the medication list:    Spironolactone 25 Mg Tabs (Spironolactone) .Marland Kitchen... 1 once daily His  updated medication list for this problem includes:    Coreg 25 Mg Tabs (Carvedilol) .Marland Kitchen... Take 1 tablet by mouth two times a day    Lasix 20 Mg Tabs (Furosemide) .Marland Kitchen... Take 1 tablet by mouth once a day     Digoxin 0.125 Mg Tabs (Digoxin) .Marland Kitchen... Take 1 tablet by mouth once a day    Lisinopril 20 Mg Tabs (Lisinopril) .Marland Kitchen... Take 1 tablet by mouth two times a day    Aspirin 81 Mg Tbec (Aspirin) .Marland Kitchen... Take one tablet by mouth daily  Problem # 2:  OTHER SPECIFIED IRON DEFICIENCY ANEMIAS (ICD-280.8) Assessment: Unchanged  His updated medication list for this problem includes:    Feosol 200 (65 Fe) Mg Tabs (Ferrous sulfate dried) ..... One by mouth two times a day with food  Hgb: 12.1 (02/08/2010)   Hct: 36.2 (02/08/2010)   Platelets: 172.0 (02/08/2010) RBC: 4.49 (02/08/2010)   RDW: 16.8 (02/08/2010)   WBC: 5.7 (02/08/2010) MCV: 80.7 (02/08/2010)   MCHC: 33.5 (02/08/2010) Iron: 109 (07/05/2009)   % Sat: 33.5 (07/05/2009) B12: 468 (11/18/2008)   Folate: 8.0 (11/18/2008)   TSH: 1.20 (02/08/2010)  Problem # 3:  HYPERTENSION (ICD-401.9) Assessment: Improved  The following medications were removed from the medication list:    Spironolactone 25 Mg Tabs (Spironolactone) .Marland Kitchen... 1 once daily His updated medication list for this problem includes:    Coreg 25 Mg Tabs (Carvedilol) .Marland Kitchen... Take 1 tablet by mouth two times a day    Lasix 20 Mg Tabs (Furosemide) .Marland Kitchen... Take 1 tablet by mouth once a day    Lisinopril 20 Mg Tabs (Lisinopril) .Marland Kitchen... Take 1 tablet by mouth two times a day  BP today: 100/70 Prior BP: 98/60 (02/08/2010)  Prior 10 Yr Risk Heart Disease: 7 % (11/18/2008)  Labs Reviewed: K+: 5.4 (02/08/2010) Creat: : 1.7 (02/08/2010)   Chol: 151 (02/08/2010)   HDL: 38.70 (02/08/2010)   LDL: 98 (02/08/2010)   TG: 74.0 (02/08/2010)  Problem # 4:  DIABETES-TYPE 2 (ICD-250.00) Assessment: Deteriorated  His updated medication list for this problem includes:    Lisinopril 20 Mg Tabs (Lisinopril) .Marland Kitchen... Take 1 tablet by mouth two times a day    Aspirin 81 Mg Tbec (Aspirin) .Marland Kitchen... Take one tablet by mouth daily    Tradjenta 5 Mg Tabs (Linagliptin) ..... One by mouth once daily for diabetes  Labs  Reviewed: Creat: 1.7 (02/08/2010)     Last Eye Exam: normal (07/13/2009) Reviewed HgBA1c results: 7.3 (02/08/2010)  6.9 (07/05/2009)  Orders: Ophthalmology Referral (Ophthalmology)  Problem # 5:  KIDNEY DISEASE, CHRONIC, STAGE II (ICD-585.2) Assessment: New  Labs Reviewed: BUN: 39 (02/08/2010)   Cr: 1.7 (02/08/2010)    Hgb: 12.1 (02/08/2010)   Hct: 36.2 (02/08/2010)   Ca++: 9.0 (02/08/2010)    TP: 6.5 (02/08/2010)   Alb: 3.9 (02/08/2010)  Complete Medication List: 1)  Coreg 25 Mg Tabs (Carvedilol) .... Take 1 tablet by mouth two times a day 2)  Lasix 20 Mg Tabs (Furosemide) .... Take 1 tablet by mouth once a day 3)  Digoxin 0.125 Mg Tabs (Digoxin) .... Take 1 tablet by mouth once a day 4)  Lisinopril 20 Mg Tabs (Lisinopril) .... Take 1 tablet by mouth two times a day 5)  Simvastatin 20 Mg Tabs (Simvastatin) .... 1/2 tab at bedtime 6)  Aspirin 81 Mg Tbec (Aspirin) .... Take one tablet by mouth daily 7)  Ibuprofen 800 Mg Tabs (Ibuprofen) .... As needed 8)  Feosol 200 (65 Fe) Mg Tabs (Ferrous sulfate dried) .Marland KitchenMarland KitchenMarland Kitchen  One by mouth two times a day with food 9)  Tradjenta 5 Mg Tabs (Linagliptin) .... One by mouth once daily for diabetes  Patient Instructions: 1)  Please schedule a follow-up appointment in 2 months. 2)  It is important that you exercise regularly at least 20 minutes 5 times a week. If you develop chest pain, have severe difficulty breathing, or feel very tired , stop exercising immediately and seek medical attention. 3)  You need to lose weight. Consider a lower calorie diet and regular exercise.  4)  Check your Blood Pressure regularly. If it is above 130/80: you should make an appointment. Prescriptions: TRADJENTA 5 MG TABS (LINAGLIPTIN) One by mouth once daily for diabetes  #30 x 11   Entered and Authorized by:   Etta Grandchild MD   Signed by:   Etta Grandchild MD on 02/23/2010   Method used:   Electronically to        [facility]  [address] [phone]* (retail)        [address]       [place], [place]  [address]       Ph: [phone] or [phone]       Fax: [phone]   RxID:   [id number]   Current Allergies (reviewed today): ! * TAPE

## 2010-07-21 NOTE — Cardiovascular Report (Signed)
Summary: Office Visit   Office Note   Imported By: Roderic Ovens 11/09/2009 15:53:24  _____________________________________________________________________  External Attachment:    Type:   Image     Comment:   External Document

## 2010-07-21 NOTE — Assessment & Plan Note (Signed)
Summary: 6 MONTH F/U ./CY   Visit Type:  rov Primary Provider:  Gregor Hams  CC:  sob when mowing the lawn...no other complaints today.  History of Present Illness: [name] comes in today for his non-ischemic cardiac myopathy.  Unfortunately, he's gained a significant amount of weight. He has a history of glucose intolerance and his last butcher was 118 in January of this year. He says he only eats one full male a day at supper. He did not eat breakfast or lunch. He has a lot more at night. We talked about the negative consequences of this in terms of his metabolism not to mention the risk of developing full-blown diabetes.  He denies any angina, orthopnea, PND or peripheral edema. His defibrillator has not fired. It checked out approximately 2 weeks ago in the device clinic. His last echocardiogram was 25-30% with moderate mitral regurgitation. Echocardiogram was obtained in January of 2010.  Current Medications (verified): 1)  Coreg 25 Mg Tabs (Carvedilol) .... Take 1 Tablet By Mouth Two Times A Day 2)  Lasix 20 Mg Tabs (Furosemide) .... Take 1 Tablet By Mouth Once A Day 3)  Digoxin 0.125 Mg Tabs (Digoxin) .... Take 1 Tablet By Mouth Once A Day 4)  Lisinopril 20 Mg Tabs (Lisinopril) .... Take 1 Tablet By Mouth Two Times A Day 5)  Simvastatin 20 Mg Tabs (Simvastatin) .... 1/2 Tab At Bedtime 6)  Aspirin 81 Mg Tbec (Aspirin) .... Take One Tablet By Mouth Daily 7)  Ibuprofen 800 Mg Tabs (Ibuprofen) .... As Needed 8)  Feosol 200 (65 Fe) Mg Tabs (Ferrous Sulfate Dried) .... One By Mouth Two Times A Day With Food  Allergies: 1)  ! 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 current DR RF single       chamber implantable cardioverter-defibrillator.   03/21/2008 Hillis Range, MD    s/p hernia repair s/p laparoscopic robotic prostatectomy 6.2008  Family History: Last updated: 10/16/2008 Family History of Prostate CA 1st degree relative  - father Family History of Cardiovascular disorder The patient's father was diagnosed with prostate cancer   in his early 31s.  He died at age 56 and did die of prostate cancer.   The patient's mother died at age 21 due to cardiovascular disease.      Social History: Last updated: 10/07/2008 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  He did smoke for three years off and on, but quit over 30   years ago.  Drinks alcohol only occasionally.      Risk Factors: Alcohol Use: 0 (10/05/2009) Exercise: yes (08/31/2008)  Risk Factors: Smoking Status: never (10/05/2009)  Review of Systems       negative other history of present illness  Vital Signs:  Patient profile:   70 year old male Height:      69 inches Weight:  223 pounds BMI:     33.05 Pulse rate:   72 / minute Pulse rhythm:   regular BP sitting:   140 / 80  (left arm) Cuff size:   large  Vitals Entered By: Danielle Rankin, CMA (November 18, 2009 1:50 PM)  Physical Exam  General:  obese.  obese.   Head:  normocephalic and atraumatic Eyes:  PERRLA/EOM intact; conjunctiva and lids normal. Neck:  Neck supple, no JVD. No masses, thyromegaly or abnormal  cervical nodes. Chest Caison Hearn:  no deformities or breast masses noted Lungs:  Clear bilaterally to auscultation and percussion. Heart:  BI poorly appreciated, normal S1-S2, no gallop Abdomen:   difficult to assess with his weight gain., positive bowel Msk:  decreased ROM.  decreased ROM.   Pulses:  pulses normal in all 4 extremities Extremities:  trace left pedal edema and trace right pedal edema.  trace left pedal edema.   Neurologic:  Alert and oriented x 3. Skin:  Intact without lesions or rashes. Psych:  Normal affect.    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 Will check electrolytes and renal function in about 10 days. His updated medication list for this problem includes:    Coreg 25 Mg Tabs (Carvedilol) .Marland Kitchen... Take 1 tablet by mouth two times a day    Lasix 20 Mg Tabs (Furosemide) .Marland Kitchen... Take 1 tablet by mouth once a day    Digoxin 0.125 Mg Tabs (Digoxin) .Marland Kitchen... Take 1 tablet by mouth once a day    Lisinopril 20 Mg Tabs (Lisinopril) .Marland Kitchen... Take 1 tablet by mouth two times a day    Aspirin 81 Mg Tbec (Aspirin) .Marland Kitchen... Take one tablet by mouth daily    Spironolactone 25 Mg Tabs (Spironolactone) .Marland Kitchen... 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 Take 1 tablet by  mouth two times a day    Lisinopril 20 Mg Tabs (Lisinopril) .Marland Kitchen... Take 1 tablet by mouth two times a day    Aspirin 81 Mg Tbec (Aspirin) .Marland Kitchen... Take one tablet by mouth daily  Problem # 3:  AUTOMATIC IMPLANTABLE CARDIAC DEFIBRILLATOR SITU (ICD-V45.02) Assessment: Unchanged  Problem # 4:  OBESITY (ICD-278.00) Assessment: Deteriorated  Problem # 5:  GLUCOSE INTOLERANCE (ICD-271.3) Assessment: Unchanged With this history, he needs to really lose weight which I have emphasized today. His last blood sugar was 118 in January.  Problem # 6:  HYPERTENSION (ICD-401.9) Assessment: Unchanged  His updated medication list for this problem includes:    Coreg 25 Mg Tabs (Carvedilol) .Marland KitchenMarland KitchenMarland KitchenMarland Kitchen  Take 1 tablet by mouth two times a day    Lasix 20 Mg Tabs (Furosemide) .Marland Kitchen... Take 1 tablet by mouth once a day    Lisinopril 20 Mg Tabs (Lisinopril) .Marland Kitchen... Take 1 tablet by mouth two times a day    Aspirin 81 Mg Tbec (Aspirin) .Marland Kitchen... Take one tablet by mouth daily    Spironolactone 25 Mg Tabs (Spironolactone) .Marland Kitchen... 1 once daily  Problem # 7:  CONGESTIVE HEART FAILURE (ICD-428.0) Assessment: Improved  His updated medication list for this problem includes:    Coreg 25 Mg Tabs (Carvedilol) .Marland Kitchen... Take 1 tablet by mouth two times a day    Lasix 20 Mg Tabs (Furosemide) .Marland Kitchen... Take 1 tablet by mouth once a day    Digoxin 0.125 Mg Tabs (Digoxin) .Marland Kitchen... Take 1 tablet by mouth once a day    Lisinopril 20 Mg Tabs (Lisinopril) .Marland Kitchen... Take 1 tablet by mouth two times a day    Aspirin 81 Mg Tbec (Aspirin) .Marland Kitchen... Take one tablet by mouth daily    Spironolactone 25 Mg Tabs (Spironolactone) .Marland Kitchen... 1 once daily  Problem # 8:  HYPERLIPIDEMIA (ICD-272.4) Assessment: Unchanged  His updated medication list for this problem includes:    Simvastatin 20 Mg Tabs (Simvastatin) .Marland Kitchen... 1/2 tab at bedtime  Patient Instructions: 1)  Your physician recommends that you schedule a follow-up appointment in: 6 MONTHS  WITH DR Cindra Austad 2)  Your  physician recommends that you return for lab work [id number] 7-10 DAYS  278.00 401.9 271.3 425.4 3)  Your physician encouraged you to lose weight for better health. 4)  Your physician has recommended you make the following change in your medication:  ADD SPIRONOLACTONE 25 MG 1 once daily Prescriptions: SPIRONOLACTONE 25 MG TABS (SPIRONOLACTONE) 1 once daily  #30 x 11   Entered by:   Scherrie Bateman, LPN   Authorized by:   Gaylord Shih, MD, [facility]   Signed by:   Scherrie Bateman, LPN on 47/82/9562   Method used:   Electronically to        [facility] [address] [id number][facility] (retail)       [address]       [place], [place]  [address]       Ph: [phone] or [phone]       Fax: [phone]   RxID:   [id number]

## 2010-07-21 NOTE — Assessment & Plan Note (Signed)
Summary: [facility]   Visit Type:  Follow-up Primary Provider:  Dr.Tom Yetta Barre   History of Present Illness: The patient presents today for routine electrophysiology followup. He reports doing very well since last being seen in our clinic. The patient denies symptoms of palpitations, chest pain, shortness of breath, orthopnea, PND, lower extremity edema, dizziness, presyncope, syncope, or neurologic sequela. The patient is tolerating medications without difficulties and is otherwise without complaint today.   Current Medications (verified): 1)  Coreg 25 Mg Tabs (Carvedilol) .... Take 1 Tablet By Mouth Two Times A Day 2)  Lasix 20 Mg Tabs (Furosemide) .... Take 1 Tablet By Mouth Once A Day 3)  Digoxin 0.125 Mg Tabs (Digoxin) .... Take 1 Tablet By Mouth Once A Day 4)  Lisinopril 20 Mg Tabs (Lisinopril) .... Take 1 Tablet By Mouth Two Times A Day 5)  Simvastatin 20 Mg Tabs (Simvastatin) .... 1/2 Tab At Bedtime 6)  Aspirin 81 Mg Tbec (Aspirin) .... Take One Tablet By Mouth Daily 7)  Ibuprofen 800 Mg Tabs (Ibuprofen) .... As Needed 8)  Feosol 200 (65 Fe) Mg Tabs (Ferrous Sulfate Dried) .... One By Mouth Two Times A Day With Food  Allergies: 1)  ! * Tape  Past History:  Past Medical History: Reviewed history from 10/16/2008 and no changes required. 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 Transurethral resection of prostate  Successful implantation of a [employer] current DR RF single       chamber implantable cardioverter-defibrillator.   03/21/2008 Hillis Range, MD    s/p hernia repair s/p laparoscopic robotic prostatectomy 6.2008  Social History: Reviewed history from 10/07/2008 and no changes required. 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  He did smoke for three years off and on, but quit over 30   years ago.  Drinks alcohol only occasionally.      Review of Systems       All systems are reviewed and negative except as listed in the HPI.   Vital Signs:  Patient profile:   70 year old male Height:      69 inches Weight:      222 pounds BMI:     32.90 Pulse rate:   78 / minute BP sitting:   130 / 80  (left arm)  Vitals Entered By: Laurance Flatten CMA (Nov 01, 2009 9:47 AM)  Physical Exam  General:  alert, well-developed, well-nourished, well-hydrated, normal appearance, healthy-appearing, cooperative to examination, good hygiene, and overweight-appearing.   Head:  normocephalic and atraumatic.   Eyes:  vision grossly intact and pupils equal.   Mouth:  Oral mucosa and oropharynx without lesions or exudates.  Teeth in good repair. Neck:  supple, full ROM, no masses, no carotid bruits, no cervical lymphadenopathy, and no neck tenderness.   Chest Wall:  ICD pocket is well healed Lungs:  Clear bilaterally to auscultation and percussion. Heart:  RRR, 2/6 SEM LLSB Abdomen:  Bowel sounds positive; abdomen soft and non-tender without  masses, organomegaly, or hernias noted. No hepatosplenomegaly. Msk:  Back normal, normal gait. Muscle strength and tone normal. Pulses:  pulses normal in all 4 extremities Extremities:  No clubbing or cyanosis. Neurologic:  Alert and oriented x 3. Skin:  Intact without lesions or rashes. Cervical Nodes:  no significant  adenopathy Psych:  Normal affect.    ICD Specifications Following MD:  Hillis Range, MD     ICD Vendor:  [employer] Jude     ICD Model Number:  [id number]     ICD Serial Number:  [id number] ICD DOI:  04/21/2008     ICD Implanting MD:  Hillis Range, MD  Lead 1:    Location: RV     DOI: 04/21/2006     Model #: [id number]     Serial #: [id number]     Status: active  Indications::  NICM   ICD Follow Up Battery Voltage:  >3.20 V     Charge Time:  10.6 seconds     Battery Est. Longevity:  7.3-7.5 yrs Underlying rhythm:  sr ICD Dependent:  No       ICD Device Measurements Right Ventricle:  Amplitude: 12.0 mV, Impedance: 410 ohms, Threshold: 1.0 V at 0.5 msec Shock Impedance: 49 ohms   Episodes MS Episodes:  0     Percent Mode Switch:  0     Coumadin:  Yes Shock:  0     ATP:  0     Nonsustained:  0     Atrial Therapies:  0 Ventricular Pacing:  <1%  Brady Parameters Mode VVI     Lower Rate Limit:  40      Tachy Zones VF:  200     Next Remote Date:  02/03/2010     Tech Comments:  Normal device function.  No changes made.  Merlin check 02-03-10.  Vella Kohler  Nov 01, 2009 10:49 AM MD Comments:  normal ICD function  no arrhythmias no changes  Impression & Recommendations:  Problem # 1:  AUTOMATIC IMPLANTABLE CARDIAC DEFIBRILLATOR SITU (ICD-V45.02) Normal ICD function no changes  Problem # 2:  CONGESTIVE HEART FAILURE (ICD-428.0) Chronic ischemic systolic dysfunciton doing well currently optivolemic no changes today  Problem # 3:  HYPERTENSION (ICD-401.9) stable  no changes today  Problem # 4:  HYPERLIPIDEMIA (ICD-272.4) stable  Patient Instructions: 1)  Your physician recommends that you schedule a follow-up appointment in: 12 months with Dr Johney Frame 2)  follow-up with Dr Daleen Squibb in 6 months

## 2010-07-21 NOTE — Letter (Signed)
Summary: [facility]   Imported By: Sherian Rein 01/12/2010 15:00:26  _____________________________________________________________________  External Attachment:    Type:   Image     Comment:   External Document

## 2010-07-21 NOTE — Letter (Signed)
Summary: Lipid Letter  [facility]  [address] [place], [place] [address]   Phone: [phone]  Fax: [phone]    02/09/2010  [name] [address] [place], [place]  [address]  Dear [name]:  We have carefully reviewed your last lipid profile from 02/08/2010 and the results are noted below with a summary of recommendations for lipid management.    Cholesterol:       151     Goal: <200   HDL "good" Cholesterol:   57.84     Goal: >40   LDL "bad" Cholesterol:   98     Goal: <100   Triglycerides:       74.0     Goal: <150        TLC Diet (Therapeutic Lifestyle Change): Saturated Fats & Transfatty acids should be kept < 7% of total calories ***Reduce Saturated Fats Polyunstaurated Fat can be up to 10% of total calories Monounsaturated Fat Fat can be up to 20% of total calories Total Fat should be no greater than 25-35% of total calories Carbohydrates should be 50-60% of total calories Protein should be approximately 15% of total calories Fiber should be at least 20-30 grams a day ***Increased fiber may help lower LDL Total Cholesterol should be < 200mg /day Consider adding plant stanol/sterols to diet (example: Benacol spread) ***A higher intake of unsaturated fat may reduce Triglycerides and Increase HDL    Adjunctive Measures (may lower LIPIDS and reduce risk of Heart Attack) include: Aerobic Exercise (20-30 minutes 3-4 times a week) Limit Alcohol Consumption Weight Reduction Aspirin 75-81 mg a day by mouth (if not allergic or contraindicated) Dietary Fiber 20-30 grams a day by mouth     Current Medications: 1)    Coreg 25 Mg Tabs (Carvedilol) .... Take 1 tablet by mouth two times a day 2)    Lasix 20 Mg Tabs (Furosemide) .... Take 1 tablet by mouth once a day 3)    Digoxin 0.125 Mg Tabs (Digoxin) .... Take 1 tablet by mouth once a day 4)    Lisinopril 20 Mg Tabs (Lisinopril) .... Take 1 tablet by mouth two times a day 5)    Simvastatin 20 Mg Tabs  (Simvastatin) .... 1/2 tab at bedtime 6)    Aspirin 81 Mg Tbec (Aspirin) .... Take one tablet by mouth daily 7)    Ibuprofen 800 Mg Tabs (Ibuprofen) .... As needed 8)    Feosol 200 (65 Fe) Mg Tabs (Ferrous sulfate dried) .... One by mouth two times a day with food 9)    Spironolactone 25 Mg Tabs (Spironolactone) .Marland Kitchen.. 1 once daily  If you have any questions, please call. We appreciate being able to work with you.   Sincerely,    [facility] Gary Grandchild MD

## 2010-07-21 NOTE — Letter (Signed)
Summary: Results Follow-up Letter  [facility]  [address] [place], [place] [address]   Phone: [phone]  Fax: [phone]    05/04/2010  [address] [place], [place]  [address]  Dear Mr. BOLLARD,   The following are the results of your recent test(s):  Test     Result     Pap Smear    Normal_______  Not Normal_____       Comments: _________________________________________________________ Cholesterol LDL(Bad cholesterol):          Your goal is less than:         HDL (Good cholesterol):        Your goal is more than: _________________________________________________________ Other Tests:   _________________________________________________________  Please call for an appointment Or _________________________________________________________ _________________________________________________________ _________________________________________________________  Sincerely,  Sanda Linger MD [facility]

## 2010-07-21 NOTE — Assessment & Plan Note (Signed)
Summary: 2 MO ROV /NWS  #   Vital Signs:  Patient profile:   70 year old male Height:      69 inches Weight:      225 pounds BMI:     33.35 O2 Sat:      96 % on Room air Temp:     97.9 degrees F oral Pulse rate:   77 / minute Pulse rhythm:   regular Resp:     16 per minute BP sitting:   112 / 68  (left arm) Cuff size:   large  Vitals Entered By: Rock Nephew CMA (April 26, 2010 10:31 AM)  Nutrition Counseling: Patient's BMI is greater than 25 and therefore counseled on weight management options.  O2 Flow:  Room air CC: follow-up visit   Primary Care Provider:  Dr.Tom Yetta Barre  CC:  follow-up visit.  History of Present Illness:  Follow-Up Visit      This is a 70 year old man who presents for Follow-up visit.  The patient denies chest pain, palpitations, dizziness, syncope, low blood sugar symptoms, high blood sugar symptoms, edema, SOB, DOE, PND, and orthopnea.  Since the last visit the patient notes no new problems or concerns.  The patient reports taking meds as prescribed, monitoring BP, monitoring blood sugars, and dietary compliance.  When questioned about possible medication side effects, the patient notes none.    Preventive Screening-Counseling & Management  Alcohol-Tobacco     Alcohol drinks/day: 0     Alcohol Counseling: not indicated; patient does not drink     Smoking Status: quit > 6 months     Year Quit: 1976     Tobacco Counseling: to remain off tobacco products  Hep-HIV-STD-Contraception     Hepatitis Risk: no risk noted     HIV Risk: no risk noted     STD Risk: no risk noted      Sexual History:  currently monogamous.        Drug Use:  never and no.        Blood Transfusions:  no.    Clinical Review Panels:  Prevention   Last Colonoscopy:  Hyperplastic Polyp (06/04/2009)   Last PSA:  <0.01 ng/mL (11/09/2008)  Immunizations   Last Tetanus Booster:  Tdap (09/24/2008)   Last Pneumovax:  given (06/20/2007)  Lipid Management   Cholesterol:   151 (02/08/2010)   LDL (bad choesterol):  98 (02/08/2010)   HDL (good cholesterol):  38.70 (02/08/2010)  Diabetes Management   HgBA1C:  7.3 (02/08/2010)   Creatinine:  1.7 (02/08/2010)   Last Dilated Eye Exam:  normal (07/13/2009)   Last Foot Exam:  yes (04/26/2010)   Last Pneumovax:  given (06/20/2007)  CBC   WBC:  5.7 (02/08/2010)   RBC:  4.49 (02/08/2010)   Hgb:  12.1 (02/08/2010)   Hct:  36.2 (02/08/2010)   Platelets:  172.0 (02/08/2010)   MCV  80.7 (02/08/2010)   MCHC  33.5 (02/08/2010)   RDW  16.8 (02/08/2010)   PMN:  57.4 (02/08/2010)   Lymphs:  27.2 (02/08/2010)   Monos:  12.3 (02/08/2010)   Eosinophils:  2.6 (02/08/2010)   Basophil:  0.5 (02/08/2010)  Complete Metabolic Panel   Glucose:  116 (02/08/2010)   Sodium:  143 (02/08/2010)   Potassium:  5.4 (02/08/2010)   Chloride:  111 (02/08/2010)   CO2:  25 (02/08/2010)   BUN:  39 (02/08/2010)   Creatinine:  1.7 (02/08/2010)   Albumin:  3.9 (02/08/2010)   Total Protein:  6.5 (02/08/2010)   Calcium:  9.0 (02/08/2010)   Total Bili:  0.5 (02/08/2010)   Alk Phos:  43 (02/08/2010)   SGPT (ALT):  23 (02/08/2010)   SGOT (AST):  21 (02/08/2010)   Medications Prior to Update: 1)  Coreg 25 Mg Tabs (Carvedilol) .... Take 1 Tablet By Mouth Two Times A Day 2)  Lasix 20 Mg Tabs (Furosemide) .... Take 1 Tablet By Mouth Once A Day 3)  Digoxin 0.125 Mg Tabs (Digoxin) .... Take 1 Tablet By Mouth Once A Day 4)  Lisinopril 20 Mg Tabs (Lisinopril) .... Take 1 Tablet By Mouth Two Times A Day 5)  Simvastatin 20 Mg Tabs (Simvastatin) .... 1/2 Tab At Bedtime 6)  Aspirin 81 Mg Tbec (Aspirin) .... Take One Tablet By Mouth Daily 7)  Ibuprofen 800 Mg Tabs (Ibuprofen) .... As Needed 8)  Feosol 200 (65 Fe) Mg Tabs (Ferrous Sulfate Dried) .... One By Mouth Two Times A Day With Food 9)  Tradjenta 5 Mg Tabs (Linagliptin) .... One By Mouth Once Daily For Diabetes  Current Medications (verified): 1)  Coreg 25 Mg Tabs (Carvedilol) .... Take 1  Tablet By Mouth Two Times A Day 2)  Lasix 20 Mg Tabs (Furosemide) .... Take 1 Tablet By Mouth Once A Day 3)  Digoxin 0.125 Mg Tabs (Digoxin) .... Take 1 Tablet By Mouth Once A Day 4)  Lisinopril 20 Mg Tabs (Lisinopril) .... Take 1 Tablet By Mouth Two Times A Day 5)  Simvastatin 20 Mg Tabs (Simvastatin) .... 1/2 Tab At Bedtime 6)  Aspirin 81 Mg Tbec (Aspirin) .... Take One Tablet By Mouth Daily 7)  Ibuprofen 800 Mg Tabs (Ibuprofen) .... As Needed 8)  Feosol 200 (65 Fe) Mg Tabs (Ferrous Sulfate Dried) .... One By Mouth Two Times A Day With Food 9)  Tradjenta 5 Mg Tabs (Linagliptin) .... One By Mouth Once Daily For Diabetes  Allergies (verified): 1)  ! 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 current DR RF single       chamber implantable cardioverter-defibrillator.   03/21/2008 Hillis Range, MD    s/p hernia repair s/p laparoscopic robotic prostatectomy 6.2008  Family History: Last updated: 10/16/2008 Family History of Prostate CA 1st degree relative  - father Family History of Cardiovascular disorder The patient's father was diagnosed with prostate cancer   in his early 56s.  He died at age 45 and did die of prostate cancer.   The patient's mother died at  age 44 due to cardiovascular disease.      Social History: Last updated: 10/07/2008 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  He did smoke for three years off and on, but quit over 30   years ago.  Drinks alcohol only occasionally.      Risk Factors: Alcohol Use: 0 (04/26/2010) Exercise: yes (08/31/2008)  Risk Factors: Smoking Status: quit > 6 months (04/26/2010)  Family History: Reviewed history from 10/16/2008 and no changes required. Family History of Prostate CA 1st degree relative  - father Family History of Cardiovascular disorder The patient's father was diagnosed with prostate cancer   in his early 40s.  He died at age 53 and did die of prostate cancer.   The patient's mother died at age 16 due to cardiovascular disease.      Social History: Reviewed history from 10/07/2008 and no changes required. 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  He did smoke for three years off and on, but quit over 30   years ago.  Drinks alcohol only occasionally.      Review of Systems  The patient denies anorexia, fever, weight loss, weight gain, chest pain, syncope, dyspnea on exertion, peripheral edema, prolonged cough, headaches, hemoptysis, abdominal pain, hematuria, suspicious skin lesions, transient blindness, enlarged lymph nodes, and angioedema.   GU:  Denies dysuria, hematuria, incontinence, nocturia, urinary frequency, and urinary hesitancy. Endo:  Complains of polyuria; denies cold intolerance, excessive hunger, excessive thirst, excessive urination, heat intolerance, and weight change.  Physical Exam  General:  alert, well-developed, well-nourished, well-hydrated, normal appearance, healthy-appearing, cooperative to examination, good hygiene, and overweight-appearing.   Head:   normocephalic, atraumatic, no abnormalities observed, and no abnormalities palpated.   Mouth:  Oral mucosa and oropharynx without lesions or exudates.  Teeth in good repair. Neck:  supple, full ROM, no masses, no carotid bruits, no cervical lymphadenopathy, and no neck tenderness.   Lungs:  Normal respiratory effort, chest expands symmetrically. Lungs are clear to auscultation, no crackles or wheezes. Heart:  Normal rate and regular rhythm. S1 and S2 normal without gallop, murmur, click, rub or other extra sounds. Abdomen:  Bowel sounds positive,abdomen soft and non-tender without masses, organomegaly or hernias noted. Msk:  deformity in his left foot. Pulses:  R and L carotid,radial,femoral,dorsalis pedis and posterior tibial pulses are full and equal bilaterally Extremities:  No clubbing, cyanosis, edema, or deformity noted with normal full range of motion of all joints.   Neurologic:  No cranial nerve deficits noted. Station and gait are normal. Plantar reflexes are down-going bilaterally. DTRs are symmetrical throughout. Sensory, motor and coordinative functions appear intact. Skin:  Intact without suspicious lesions or rashes Cervical Nodes:  no anterior cervical adenopathy and no posterior cervical adenopathy.   Axillary Nodes:  no R axillary adenopathy and no L axillary adenopathy.   Psych:  Cognition and judgment appear intact. Alert and cooperative with normal attention span and concentration. No apparent delusions, illusions, hallucinations  Diabetes Management Exam:    Foot Exam (with socks and/or shoes not present):       Sensory-Pinprick/Light touch:          Left medial foot (L-4): normal          Left dorsal foot (L-5): normal          Left lateral foot (S-1): normal          Right medial foot (L-4): normal          Right dorsal foot (L-5): normal          Right lateral foot (S-1): normal       Sensory-Monofilament:          Left foot: normal          Right foot: normal        Inspection:  Left foot: normal          Right foot: normal       Nails:          Left foot: normal          Right foot: normal   Impression & Recommendations:  Problem # 1:  KIDNEY DISEASE, CHRONIC, STAGE II (ICD-585.2) Assessment Unchanged  Orders: Venipuncture (53664) TLB-BMP (Basic Metabolic Panel-BMET) (80048-METABOL) TLB-CBC Platelet - w/Differential (85025-CBCD) TLB-Hepatic/Liver Function Pnl (80076-HEPATIC) TLB-A1C / Hgb A1C (Glycohemoglobin) (83036-A1C)  Labs Reviewed: BUN: 39 (02/08/2010)   Cr: 1.7 (02/08/2010)    Hgb: 12.1 (02/08/2010)   Hct: 36.2 (02/08/2010)   Ca++: 9.0 (02/08/2010)    TP: 6.5 (02/08/2010)   Alb: 3.9 (02/08/2010)  Problem # 2:  DIABETES-TYPE 2 (ICD-250.00) Assessment: Unchanged  His updated medication list for this problem includes:    Lisinopril 20 Mg Tabs (Lisinopril) .Marland Kitchen... Take 1 tablet by mouth two times a day    Aspirin 81 Mg Tbec (Aspirin) .Marland Kitchen... Take one tablet by mouth daily    Tradjenta 5 Mg Tabs (Linagliptin) ..... One by mouth once daily for diabetes  Orders: Venipuncture (40347) TLB-BMP (Basic Metabolic Panel-BMET) (80048-METABOL) TLB-CBC Platelet - w/Differential (85025-CBCD) TLB-Hepatic/Liver Function Pnl (80076-HEPATIC) TLB-A1C / Hgb A1C (Glycohemoglobin) (83036-A1C)  Labs Reviewed: Creat: 1.7 (02/08/2010)     Last Eye Exam: normal (07/13/2009) Reviewed HgBA1c results: 7.3 (02/08/2010)  6.9 (07/05/2009)  Complete Medication List: 1)  Coreg 25 Mg Tabs (Carvedilol) .... Take 1 tablet by mouth two times a day 2)  Lasix 20 Mg Tabs (Furosemide) .... Take 1 tablet by mouth once a day 3)  Digoxin 0.125 Mg Tabs (Digoxin) .... Take 1 tablet by mouth once a day 4)  Lisinopril 20 Mg Tabs (Lisinopril) .... Take 1 tablet by mouth two times a day 5)  Simvastatin 20 Mg Tabs (Simvastatin) .... 1/2 tab at bedtime 6)  Aspirin 81 Mg Tbec (Aspirin) .... Take one tablet by mouth daily 7)  Ibuprofen 800 Mg Tabs (Ibuprofen) .... As  needed 8)  Feosol 200 (65 Fe) Mg Tabs (Ferrous sulfate dried) .... One by mouth two times a day with food 9)  Tradjenta 5 Mg Tabs (Linagliptin) .... One by mouth once daily for diabetes  Patient Instructions: 1)  Please schedule a follow-up appointment in 3 months. 2)  It is important that you exercise regularly at least 20 minutes 5 times a week. If you develop chest pain, have severe difficulty breathing, or feel very tired , stop exercising immediately and seek medical attention. 3)  You need to lose weight. Consider a lower calorie diet and regular exercise.  4)  Check your blood sugars regularly. If your readings are usually above 200 or below 70 you should contact our office. 5)  It is important that your Diabetic A1c level is checked every 3 months. 6)  See your eye doctor yearly to check for diabetic eye damage. 7)  Check your feet each night for sore areas, calluses or signs of infection. 8)  Check your Blood Pressure regularly. If it is above 130/80: you should make an appointment. Prescriptions: TRADJENTA 5 MG TABS (LINAGLIPTIN) One by mouth once daily for diabetes  #70 x 0   Entered and Authorized by:   Etta Grandchild MD   Signed by:   Etta Grandchild MD on 04/26/2010   Method used:   Samples Given   RxID:   [id number]    Orders Added: 1)  Venipuncture [36415] 2)  TLB-BMP (  Basic Metabolic Panel-BMET) [80048-METABOL] 3)  TLB-CBC Platelet - w/Differential [85025-CBCD] 4)  TLB-Hepatic/Liver Function Pnl [80076-HEPATIC] 5)  TLB-A1C / Hgb A1C (Glycohemoglobin) [83036-A1C] 6)  Est. Patient Level III [21308]

## 2010-07-21 NOTE — Letter (Signed)
Summary: [facility] results/[facility]  [facility] results/[facility]   Imported By: Lester [place] 07/06/2009 08:20:47  _____________________________________________________________________  External Attachment:    Type:   Image     Comment:   External Document

## 2010-07-21 NOTE — Assessment & Plan Note (Signed)
Summary: 2 wk f/u #/cd   Vital Signs:  Patient profile:   70 year old male Height:      69 inches Weight:      225 pounds BMI:     33.35 O2 Sat:      98 % on Room air Temp:     98.2 degrees F oral Pulse rate:   83 / minute Pulse rhythm:   regular Resp:     16 per minute BP sitting:   138 / 70  (left arm) Cuff size:   large  Vitals Entered By: Rock Nephew CMA (May 18, 2010 10:56 AM)  Nutrition Counseling: Patient's BMI is greater than 25 and therefore counseled on weight management options.  O2 Flow:  Room air  Primary Care Provider:  Dr.Tom Yetta Barre   History of Present Illness:  Follow-Up Visit      This is a 70 year old man who presents for Follow-up visit.  The patient denies chest pain, palpitations, dizziness, syncope, low blood sugar symptoms, high blood sugar symptoms, edema, SOB, DOE, PND, and orthopnea.  Since the last visit the patient notes no new problems or concerns.  The patient reports taking meds as prescribed, monitoring BP, monitoring blood sugars, and dietary noncompliance.  When questioned about possible medication side effects, the patient notes none.    Preventive Screening-Counseling & Management  Alcohol-Tobacco     Alcohol drinks/day: 0     Alcohol Counseling: not indicated; patient does not drink     Smoking Status: quit > 6 months     Year Quit: 1976     Tobacco Counseling: to remain off tobacco products  Hep-HIV-STD-Contraception     Hepatitis Risk: no risk noted     HIV Risk: no risk noted     STD Risk: no risk noted  Clinical Review Panels:  Prevention   Last Colonoscopy:  Hyperplastic Polyp (06/04/2009)   Last PSA:  <0.01 ng/mL (11/09/2008)  Immunizations   Last Tetanus Booster:  Tdap (09/24/2008)   Last Pneumovax:  given (06/20/2007)  Lipid Management   Cholesterol:  151 (02/08/2010)   LDL (bad choesterol):  98 (02/08/2010)   HDL (good cholesterol):  38.70 (02/08/2010)  Diabetes Management   HgBA1C:  6.6 (04/26/2010)  Creatinine:  1.3 (04/26/2010)   Last Dilated Eye Exam:  normal (07/13/2009)   Last Foot Exam:  yes (05/18/2010)   Last Pneumovax:  given (06/20/2007)  CBC   WBC:  5.8 (04/26/2010)   RBC:  4.50 (04/26/2010)   Hgb:  12.5 (04/26/2010)   Hct:  37.6 (04/26/2010)   Platelets:  192.0 (04/26/2010)   MCV  83.5 (04/26/2010)   MCHC  33.3 (04/26/2010)   RDW  15.6 (04/26/2010)   PMN:  59.5 (04/26/2010)   Lymphs:  25.5 (04/26/2010)   Monos:  11.6 (04/26/2010)   Eosinophils:  2.8 (04/26/2010)   Basophil:  0.6 (04/26/2010)  Complete Metabolic Panel   Glucose:  103 (04/26/2010)   Sodium:  143 (04/26/2010)   Potassium:  4.5 (04/26/2010)   Chloride:  108 (04/26/2010)   CO2:  28 (04/26/2010)   BUN:  29 (04/26/2010)   Creatinine:  1.3 (04/26/2010)   Albumin:  3.7 (04/26/2010)   Total Protein:  6.3 (04/26/2010)   Calcium:  9.0 (04/26/2010)   Total Bili:  0.7 (04/26/2010)   Alk Phos:  44 (04/26/2010)   SGPT (ALT):  23 (04/26/2010)   SGOT (AST):  21 (04/26/2010)   Medications Prior to Update: 1)  Coreg 25 Mg Tabs (Carvedilol) .Marland KitchenMarland KitchenMarland Kitchen  Take 1 Tablet By Mouth Two Times A Day 2)  Lasix 20 Mg Tabs (Furosemide) .... Take 1 Tablet By Mouth Once A Day 3)  Digoxin 0.125 Mg Tabs (Digoxin) .... Take 1 Tablet By Mouth Once A Day 4)  Lisinopril 20 Mg Tabs (Lisinopril) .... Take 1 Tablet By Mouth Two Times A Day 5)  Simvastatin 20 Mg Tabs (Simvastatin) .... 1/2 Tab At Bedtime 6)  Aspirin 81 Mg Tbec (Aspirin) .... Take One Tablet By Mouth Daily 7)  Ibuprofen 800 Mg Tabs (Ibuprofen) .... As Needed 8)  Feosol 200 (65 Fe) Mg Tabs (Ferrous Sulfate Dried) .... One By Mouth Two Times A Day With Food 9)  Tradjenta 5 Mg Tabs (Linagliptin) .... One By Mouth Once Daily For Diabetes  Current Medications (verified): 1)  Coreg 25 Mg Tabs (Carvedilol) .... Take 1 Tablet By Mouth Two Times A Day 2)  Lasix 20 Mg Tabs (Furosemide) .... Take 1 Tablet By Mouth Once A Day 3)  Digoxin 0.125 Mg Tabs (Digoxin) .... Take 1 Tablet  By Mouth Once A Day 4)  Lisinopril 20 Mg Tabs (Lisinopril) .... Take 1 Tablet By Mouth Two Times A Day 5)  Simvastatin 20 Mg Tabs (Simvastatin) .... 1/2 Tab At Bedtime 6)  Aspirin 81 Mg Tbec (Aspirin) .... Take One Tablet By Mouth Daily 7)  Ibuprofen 800 Mg Tabs (Ibuprofen) .... As Needed 8)  Feosol 200 (65 Fe) Mg Tabs (Ferrous Sulfate Dried) .... One By Mouth Two Times A Day With Food 9)  Tradjenta 5 Mg Tabs (Linagliptin) .... One By Mouth Once Daily For Diabetes  Allergies (verified): 1)  ! 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 current DR RF single       chamber implantable cardioverter-defibrillator.   03/21/2008 Hillis Range, MD    s/p hernia repair s/p laparoscopic robotic prostatectomy 6.2008  Family History: Last updated: 10/16/2008 Family History of Prostate CA 1st degree relative  - father Family History of Cardiovascular disorder The patient's father was diagnosed with prostate cancer   in his early 39s.  He died at age 17 and did die of prostate cancer.   The patient's mother died at age 80 due to cardiovascular disease.      Social History: Last updated: 10/07/2008 Retired Married Alcohol use-no Drug use-no Regular exercise-yes The  patient works as a Paramedic.  He is currently   married.  He did smoke for three years off and on, but quit over 30   years ago.  Drinks alcohol only occasionally.      Risk Factors: Alcohol Use: 0 (05/18/2010) Exercise: yes (08/31/2008)  Risk Factors: Smoking Status: quit > 6 months (05/18/2010)  Family History: Reviewed history from 10/16/2008 and no changes required. Family History of Prostate CA 1st degree relative  - father Family History of Cardiovascular disorder The patient's father was diagnosed with prostate cancer   in his early 34s.  He died at age 13 and did die  of prostate cancer.   The patient's mother died at age 36 due to cardiovascular disease.      Social History: Reviewed history from 10/07/2008 and no changes required. 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  He did smoke for three years off and on, but quit over 30   years ago.  Drinks alcohol only occasionally.      Review of Systems       The patient complains of weight gain.  The patient denies weight loss, chest pain, syncope, dyspnea on exertion, peripheral edema, prolonged cough, headaches, hemoptysis, abdominal pain, suspicious skin lesions, difficulty walking, and depression.   MS:  Denies joint pain, joint redness, joint swelling, loss of strength, low back pain, muscle aches, and muscle weakness. Neuro:  Denies disturbances in coordination, falling down, numbness, tingling, and weakness. Endo:  Denies cold intolerance, excessive hunger, excessive thirst, excessive urination, heat intolerance, and polyuria. Heme:  Denies abnormal bruising, bleeding, enlarge lymph nodes, fevers, pallor, and skin discoloration.  Physical Exam  General:  alert, well-developed, well-nourished, well-hydrated, normal appearance, healthy-appearing, cooperative to examination, good hygiene, and overweight-appearing.   Head:   normocephalic, atraumatic, no abnormalities observed, and no abnormalities palpated.   Mouth:  Oral mucosa and oropharynx without lesions or exudates.  Teeth in good repair. Neck:  supple, full ROM, no masses, no carotid bruits, no cervical lymphadenopathy, and no neck tenderness.   Lungs:  Normal respiratory effort, chest expands symmetrically. Lungs are clear to auscultation, no crackles or wheezes. Heart:  Normal rate and regular rhythm. S1 and S2 normal without gallop, murmur, click, rub or other extra sounds. Abdomen:  Bowel sounds positive,abdomen soft and non-tender without masses, organomegaly or hernias noted. Msk:  normal ROM, no joint tenderness, no joint swelling, no joint warmth, no redness over joints, no joint deformities, no joint instability, no crepitation, and no muscle atrophy.   Pulses:  R and L carotid,radial,femoral,dorsalis pedis and posterior tibial pulses are full and equal bilaterally Extremities:  No clubbing, cyanosis, edema, or deformity noted with normal full range of motion of all joints.   Neurologic:  No cranial nerve deficits noted. Station and gait are normal. Plantar reflexes are down-going bilaterally. DTRs are symmetrical throughout. Sensory, motor and coordinative functions appear intact. Skin:  Intact without suspicious lesions or rashes Cervical Nodes:  no anterior cervical adenopathy and no posterior cervical adenopathy.   Inguinal Nodes:  no R inguinal adenopathy and no L inguinal adenopathy.   Psych:  Cognition and judgment appear intact. Alert and cooperative with normal attention span and concentration. No apparent delusions, illusions, hallucinations  Diabetes Management Exam:    Foot Exam (with socks and/or shoes not present):       Sensory-Pinprick/Light touch:          Left medial foot (L-4): normal          Left dorsal foot (L-5): normal          Left lateral foot (S-1): normal          Right medial foot (L-4): normal          Right dorsal  foot (L-5): normal          Right lateral foot (S-1): normal       Sensory-Monofilament:          Left foot: normal          Right foot: normal  Inspection:          Left foot: normal          Right foot: normal       Nails:          Left foot: normal          Right foot: normal   Impression & Recommendations:  Problem # 1:  HIP PAIN, RIGHT (ICD-719.45) Assessment Improved  His updated medication list for this problem includes:    Aspirin 81 Mg Tbec (Aspirin) .Marland Kitchen... Take one tablet by mouth daily    Ibuprofen 800 Mg Tabs (Ibuprofen) .Marland Kitchen... As needed  Problem # 2:  LUMBAR RADICULOPATHY (ICD-724.4) Assessment: Improved  His updated medication list for this problem includes:    Aspirin 81 Mg Tbec (Aspirin) .Marland Kitchen... Take one tablet by mouth daily    Ibuprofen 800 Mg Tabs (Ibuprofen) .Marland Kitchen... As needed  Problem # 3:  DIABETES-TYPE 2 (ICD-250.00) Assessment: Improved  His updated medication list for this problem includes:    Lisinopril 20 Mg Tabs (Lisinopril) .Marland Kitchen... Take 1 tablet by mouth two times a day    Aspirin 81 Mg Tbec (Aspirin) .Marland Kitchen... Take one tablet by mouth daily    Tradjenta 5 Mg Tabs (Linagliptin) ..... One by mouth once daily for diabetes  Labs Reviewed: Creat: 1.3 (04/26/2010)     Last Eye Exam: normal (07/13/2009) Reviewed HgBA1c results: 6.6 (04/26/2010)  7.3 (02/08/2010)  Problem # 4:  OTHER SPECIFIED IRON DEFICIENCY ANEMIAS (ICD-280.8) Assessment: Unchanged  His updated medication list for this problem includes:    Feosol 200 (65 Fe) Mg Tabs (Ferrous sulfate dried) ..... One by mouth two times a day with food  Hgb: 12.5 (04/26/2010)   Hct: 37.6 (04/26/2010)   Platelets: 192.0 (04/26/2010) RBC: 4.50 (04/26/2010)   RDW: 15.6 (04/26/2010)   WBC: 5.8 (04/26/2010) MCV: 83.5 (04/26/2010)   MCHC: 33.3 (04/26/2010) Iron: 109 (07/05/2009)   % Sat: 33.5 (07/05/2009) B12: 468 (11/18/2008)   Folate: 8.0 (11/18/2008)   TSH: 1.20 (02/08/2010)  Problem # 5:   HYPERTENSION (ICD-401.9) Assessment: Unchanged  His updated medication list for this problem includes:    Coreg 25 Mg Tabs (Carvedilol) .Marland Kitchen... Take 1 tablet by mouth two times a day    Lasix 20 Mg Tabs (Furosemide) .Marland Kitchen... Take 1 tablet by mouth once a day    Lisinopril 20 Mg Tabs (Lisinopril) .Marland Kitchen... Take 1 tablet by mouth two times a day  BP today: 138/70 Prior BP: 130/82 (05/04/2010)  Prior 10 Yr Risk Heart Disease: 7 % (11/18/2008)  Labs Reviewed: K+: 4.5 (04/26/2010) Creat: : 1.3 (04/26/2010)   Chol: 151 (02/08/2010)   HDL: 38.70 (02/08/2010)   LDL: 98 (02/08/2010)   TG: 74.0 (02/08/2010)  Complete Medication List: 1)  Coreg 25 Mg Tabs (Carvedilol) .... Take 1 tablet by mouth two times a day 2)  Lasix 20 Mg Tabs (Furosemide) .... Take 1 tablet by mouth once a day 3)  Digoxin 0.125 Mg Tabs (Digoxin) .... Take 1 tablet by mouth once a day 4)  Lisinopril 20 Mg Tabs (Lisinopril) .... Take 1 tablet by mouth two times a day 5)  Simvastatin 20 Mg Tabs (Simvastatin) .... 1/2 tab at bedtime 6)  Aspirin 81 Mg Tbec (Aspirin) .... Take one tablet by mouth daily 7)  Ibuprofen 800 Mg Tabs (Ibuprofen) .... As needed 8)  Feosol 200 (65 Fe) Mg Tabs (Ferrous sulfate dried) .... One by mouth two times a day with food 9)  Tradjenta 5 Mg Tabs (Linagliptin) .... One  by mouth once daily for diabetes  Patient Instructions: 1)  Please schedule a follow-up appointment in 3 months. 2)  It is important that you exercise regularly at least 20 minutes 5 times a week. If you develop chest pain, have severe difficulty breathing, or feel very tired , stop exercising immediately and seek medical attention. 3)  You need to lose weight. Consider a lower calorie diet and regular exercise.  4)  Check your blood sugars regularly. If your readings are usually above 200 or below 70 you should contact our office. 5)  It is important that your Diabetic A1c level is checked every 3 months. 6)  See your eye doctor yearly to  check for diabetic eye damage. 7)  Check your feet each night for sore areas, calluses or signs of infection. 8)  Check your Blood Pressure regularly. If it is above 130/80: you should make an appointment.   Orders Added: 1)  Est. Patient Level IV [66063]

## 2010-07-21 NOTE — Assessment & Plan Note (Signed)
Summary: 4 MTH FU #  STC     Vital Signs:  Patient profile:   70 year old male Height:      69 inches Weight:      228 pounds BMI:     33.79 O2 Sat:      98 % on Room air Temp:     97.1 degrees F oral Pulse rate:   77 / minute Pulse rhythm:   regular Resp:     16 per minute BP sitting:   118 / 78  (left arm) Cuff size:   large  Vitals Entered By: Rock Nephew CMA (July 05, 2009 11:17 AM)  Nutrition Counseling: Patient's BMI is greater than 25 and therefore counseled on weight management options.  O2 Flow:  Room air CC: follow-up visit, Preventive Care, Back Pain, Hypertension Management Is Patient Diabetic? No Pain Assessment Patient in pain? no        Primary Care Provider:  Gregor Hams  CC:  follow-up visit, Preventive Care, Back Pain, and Hypertension Management.  History of Present Illness: He returns for f/up and complains of right low back and hip pain for 2 months.  Back Pain History:      The patient's back pain started approximately 04/26/2009.  The pain is located in the lower back region and does radiate below the knees.  He states this is not work related.  On a scale of 1-10, he describes the pain as a 3.  He states that he has no prior history of back pain.  The patient has not had any recent physical therapy for his back pain.  The following makes the back pain better: rest.  The following makes the back pain worse: bending.        Description of injury in patient's own words:  none.    Critical Exclusionary Diagnosis Criteria (CEDC) for Back Pain:      The patient denies a history of previous trauma.  He has no prior history of spinal surgery.  There are no symptoms to suggest infection, cauda equina, or psychosocial factors for back pain.  Cancer risk factors include age >50 yrs with new back pain.    Hypertension History:      He denies headache, chest pain, palpitations, dyspnea with exertion, orthopnea, peripheral edema, neurologic problems,  syncope, and side effects from treatment.  He notes no problems with any antihypertensive medication side effects.        Positive major cardiovascular risk factors include male age 69 years old or older, hyperlipidemia, and hypertension.  Negative major cardiovascular risk factors include no history of diabetes, negative family history for ischemic heart disease, and non-tobacco-user status.        Positive history for target organ damage include cardiac end organ damage (either CHF or LVH).  Further assessment for target organ damage reveals no history of ASHD, stroke/TIA, peripheral vascular disease, renal insufficiency, or hypertensive retinopathy.      Current Medications (verified): 1)  Coreg 25 Mg Tabs (Carvedilol) .... Take 1 Tablet By Mouth Two Times A Day 2)  Lasix 20 Mg Tabs (Furosemide) .... Take 1 Tablet By Mouth Once A Day 3)  Digoxin 0.125 Mg Tabs (Digoxin) .... Take 1 Tablet By Mouth Once A Day 4)  Lisinopril 20 Mg Tabs (Lisinopril) .... Take 1 Tablet By Mouth Two Times A Day 5)  Simvastatin 20 Mg Tabs (Simvastatin) .... 1/2 Tab At Bedtime 6)  Aspirin 81 Mg Tbec (Aspirin) .... Take One Tablet By  Mouth Daily 7)  Ibuprofen 800 Mg Tabs (Ibuprofen) .... As Needed 8)  Feosol 200 (65 Fe) Mg Tabs (Ferrous Sulfate Dried) .... One By Mouth Two Times A Day With Food  Allergies (verified): 1)  ! * Tape  Past History:  Past Medical History: Reviewed history from 10/16/2008 and no changes required. 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 Transurethral resection of prostate  Successful implantation of a [employer] current DR RF single       chamber implantable cardioverter-defibrillator.   03/21/2008 Hillis Range, MD    s/p hernia repair s/p laparoscopic robotic prostatectomy 6.2008  Family History: Reviewed history from 10/16/2008 and no changes required. Family History of Prostate CA 1st degree relative  - father Family History of Cardiovascular disorder The patient's father was diagnosed with prostate cancer   in his early 48s.  He died at age 23 and did die of prostate cancer.   The patient's mother died at age 59 due to cardiovascular disease.      Social History: Reviewed history from 10/07/2008 and no changes required. 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  He did smoke for three years off and on, but quit over 30   years ago.  Drinks alcohol only occasionally.      Review of Systems       The patient complains of weight gain and severe indigestion/heartburn.  The patient denies anorexia, fever, weight loss, chest pain, prolonged cough, hemoptysis, abdominal pain, melena, hematochezia, hematuria, difficulty walking, depression, enlarged lymph nodes, and angioedema.   Endo:  Denies cold intolerance, excessive hunger, excessive thirst, excessive urination, polyuria, and weight change. Heme:  Denies abnormal bruising, bleeding, enlarge lymph nodes, fevers, pallor, and skin discoloration.  Physical Exam  General:  alert, well-developed, well-nourished, well-hydrated, normal appearance, healthy-appearing, cooperative to examination, good hygiene, and overweight-appearing.   Head:  normocephalic and atraumatic.   Eyes:  vision grossly intact and pupils  equal.   Mouth:  Oral mucosa and oropharynx without lesions or exudates.  Teeth in good repair. Neck:  supple, full ROM, no masses, no carotid bruits, no cervical lymphadenopathy, and no neck tenderness.   Lungs:  Normal respiratory effort, chest expands symmetrically. Lungs are clear to auscultation, no crackles or wheezes. Heart:  Normal rate and regular rhythm. S1 and S2 normal without gallop, murmur, click, rub or other extra sounds. Abdomen:  Bowel sounds positive,abdomen soft and non-tender without masses, organomegaly or hernias noted. Msk:  normal ROM, no joint tenderness, no joint swelling, no joint warmth, no redness over joints, no joint deformities, no joint instability, no crepitation, and no muscle atrophy.   Pulses:  R and L carotid,radial,femoral,dorsalis pedis and posterior tibial pulses are full and equal bilaterally Extremities:  No clubbing, cyanosis, edema, or deformity noted with normal full range of motion of all joints.   Neurologic:  No cranial nerve deficits noted. Station and gait are normal. Plantar reflexes are down-going bilaterally. DTRs are symmetrical throughout. Sensory,  motor and coordinative functions appear intact. Skin:  Intact without suspicious lesions or rashes Cervical Nodes:  no anterior cervical adenopathy and no posterior cervical adenopathy.   Axillary Nodes:  no R axillary adenopathy and no L axillary adenopathy.   Psych:  Cognition and judgment appear intact. Alert and cooperative with normal attention span and concentration. No apparent delusions, illusions, hallucinations  Low Back Pain Physical Exam:    Inspection-deformity:     No    Palpation-spinal tenderness:   No    Motor Exam/Strength:         Left Ankle Dorsiflexion (L5,L4):     normal       Left Great Toe Dorsiflexion (L5,L4):     normal       Left Heel Walk (L5,some L4):     normal       Left Single Squat & Rise-Quads (L4):   normal       Left Toe Walk-calf (S1):       normal        Right Ankle Dorsiflexion (L5,L4):     normal       Right Great Toe Dorsiflexion (L5,L4):       normal       Right Heel Walk (L5,some L4):     normal       Right Single Squat & Rise Quads (L4):   normal       Right Toe Walk-calf (S1):       normal    Sensory Exam/Pinprick:        Left Medial Foot (L4):   normal       Left Dorsal Foot (L5):   normal       Left Lateral Foot (S1):   normal       Right Medial Foot (L4):   normal       Right Dorsal Foot (L5):   normal       Right Lateral Foot (S1):   normal    Reflexes:        Left Knee Jerk (L4):     decreased       Left Ankle Reflex (S1):   decreased       Right Knee Jerk:     decreased       Right Ankle Reflex (S1):   decreased    Straight Leg Raise (SLR):       Left Straight Leg Raise (SLR):   negative       Right Straight Leg Raise (SLR):   negative   Impression & Recommendations:  Problem # 1:  BACK PAIN (ICD-724.5)  His updated medication list for this problem includes:    Aspirin 81 Mg Tbec (Aspirin) .Marland Kitchen... Take one tablet by mouth daily    Ibuprofen 800 Mg Tabs (Ibuprofen) .Marland Kitchen... 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 One by mouth two times a day with food  Orders: Venipuncture (16109) TLB-IBC Pnl (Iron/FE;Transferrin) (83550-IBC) TLB-CBC Platelet - w/Differential (85025-CBCD) TLB-BMP (Basic Metabolic Panel-BMET) (80048-METABOL) TLB-Hepatic/Liver Function Pnl (80076-HEPATIC) TLB-TSH (Thyroid Stimulating Hormone) (84443-TSH) TLB-A1C / Hgb A1C (Glycohemoglobin) (83036-A1C) TLB-Udip w/ Micro (81001-URINE)  Problem # 3:  GLUCOSE INTOLERANCE (ICD-271.3) Assessment: Unchanged  Problem # 4:  OSTEOARTHRITIS (ICD-715.90) Assessment: Deteriorated  His  updated medication list for this problem includes:    Aspirin 81 Mg Tbec (Aspirin) .Marland Kitchen... Take one tablet by mouth daily    Ibuprofen 800 Mg Tabs (Ibuprofen) .Marland Kitchen... As needed  Orders: T-Hip Comp Right Min 2 views (73510TC) T-Lumbar Spine Complete, 5 Views (71110TC)  Problem # 5:  HYPERTENSION (ICD-401.9) Assessment: Improved  His updated medication list for this problem includes:    Coreg 25 Mg Tabs (Carvedilol) .Marland Kitchen... Take 1 tablet by mouth two times a day    Lasix 20 Mg Tabs (Furosemide) .Marland Kitchen... Take 1 tablet by mouth once a day    Lisinopril 20 Mg Tabs (Lisinopril) .Marland Kitchen... Take 1 tablet by mouth two times a day  Orders: Venipuncture (16109) TLB-IBC Pnl (Iron/FE;Transferrin) (83550-IBC) TLB-CBC Platelet - w/Differential (85025-CBCD) TLB-BMP (Basic Metabolic Panel-BMET) (80048-METABOL) TLB-Hepatic/Liver Function Pnl (80076-HEPATIC) TLB-TSH (Thyroid Stimulating Hormone) (84443-TSH) TLB-A1C / Hgb A1C (Glycohemoglobin) (83036-A1C) TLB-Udip w/ Micro (81001-URINE)  BP today: 118/78 Prior BP: 132/80 (05/28/2009)  Prior 10 Yr Risk Heart Disease: 7 % (11/18/2008)  Labs Reviewed: K+: 4.5 (11/09/2008) Creat: : 1.4 (11/09/2008)   Chol: 171 (11/09/2008)   HDL: 45.10 (11/09/2008)   LDL: 98 (11/09/2008)   TG: 141.0 (11/09/2008)  Complete Medication List: 1)  Coreg 25 Mg Tabs (Carvedilol) .... Take 1 tablet by mouth two times a day 2)  Lasix 20 Mg Tabs (Furosemide) .... Take 1 tablet by mouth once a day 3)  Digoxin 0.125 Mg Tabs (Digoxin) .... Take 1 tablet by mouth once a day 4)  Lisinopril 20 Mg Tabs (Lisinopril) .... Take 1 tablet by mouth two times a day 5)  Simvastatin 20 Mg Tabs (Simvastatin) .... 1/2 tab at bedtime 6)  Aspirin 81 Mg Tbec (Aspirin) .... Take one tablet by mouth daily 7)  Ibuprofen 800 Mg Tabs (Ibuprofen) .... As needed 8)  Feosol 200 (65 Fe) Mg Tabs (Ferrous sulfate dried) .... One by mouth two times a day with food  Hypertension Assessment/Plan:      The patient's  hypertensive risk group is category C: Target organ damage and/or diabetes.  His calculated 10 year risk of coronary heart disease is 7 %.  Today's blood pressure is 118/78.  His blood pressure goal is < 140/90.  Colorectal Screening:  Current Recommendations:    Colonoscopy recommended: patient defers today but will consider in the future  Colonoscopy Results:    Date of Exam: 06/04/2009    Results: Hyperplastic Polyp  PSA Screening:    PSA: <0.01 ng/mL  (11/09/2008)  Immunization & Chemoprophylaxis:    Tetanus vaccine: Tdap  (09/24/2008)  Patient Instructions: 1)  Please schedule a follow-up appointment in 1 month. 2)  It is important that you exercise regularly at least 20 minutes 5 times a week. If you develop chest pain, have severe difficulty breathing, or feel very tired , stop exercising immediately and seek medical attention. 3)  You need to lose weight. Consider a lower calorie diet and regular exercise.  4)  Check your Blood Pressure regularly. If it is above: you should make an appointment. 5)  Take 650-1000mg  of Tylenol every 4-6 hours as needed for relief of pain or comfort of fever AVOID taking more than 4000mg   in a 24 hour period (can cause liver damage in higher doses). 6)  Most patients (90%) with low back pain will improve with time (2-6 weeks). Keep active but avoid activities that are painful. Apply moist heat and/or ice to lower back several times a day.

## 2010-07-27 ENCOUNTER — Encounter: Payer: Self-pay | Admitting: Internal Medicine

## 2010-07-27 ENCOUNTER — Ambulatory Visit (INDEPENDENT_AMBULATORY_CARE_PROVIDER_SITE_OTHER): Payer: Medicare Other | Admitting: Internal Medicine

## 2010-07-27 ENCOUNTER — Other Ambulatory Visit: Payer: Medicare Other

## 2010-07-27 ENCOUNTER — Other Ambulatory Visit: Payer: Self-pay | Admitting: Internal Medicine

## 2010-07-27 ENCOUNTER — Encounter (INDEPENDENT_AMBULATORY_CARE_PROVIDER_SITE_OTHER): Payer: Self-pay | Admitting: *Deleted

## 2010-07-27 DIAGNOSIS — I10 Essential (primary) hypertension: Secondary | ICD-10-CM

## 2010-07-27 DIAGNOSIS — I509 Heart failure, unspecified: Secondary | ICD-10-CM

## 2010-07-27 DIAGNOSIS — N182 Chronic kidney disease, stage 2 (mild): Secondary | ICD-10-CM

## 2010-07-27 DIAGNOSIS — E785 Hyperlipidemia, unspecified: Secondary | ICD-10-CM

## 2010-07-27 DIAGNOSIS — E119 Type 2 diabetes mellitus without complications: Secondary | ICD-10-CM

## 2010-07-27 DIAGNOSIS — D508 Other iron deficiency anemias: Secondary | ICD-10-CM

## 2010-07-27 LAB — BASIC METABOLIC PANEL
Chloride: 109 mEq/L (ref 96–112)
Creatinine, Ser: 1.2 mg/dL (ref 0.4–1.5)
Potassium: 4.4 mEq/L (ref 3.5–5.1)
Sodium: 143 mEq/L (ref 135–145)

## 2010-07-27 LAB — HEPATIC FUNCTION PANEL
ALT: 26 U/L (ref 0–53)
Alkaline Phosphatase: 45 U/L (ref 39–117)
Bilirubin, Direct: 0.1 mg/dL (ref 0.0–0.3)
Total Protein: 6.6 g/dL (ref 6.0–8.3)

## 2010-07-27 LAB — CBC WITH DIFFERENTIAL/PLATELET
Basophils Relative: 0.7 % (ref 0.0–3.0)
Eosinophils Relative: 2.9 % (ref 0.0–5.0)
HCT: 39 % (ref 39.0–52.0)
MCV: 79.9 fl (ref 78.0–100.0)
Monocytes Absolute: 0.7 10*3/uL (ref 0.1–1.0)
Neutrophils Relative %: 57.2 % (ref 43.0–77.0)
RBC: 4.87 Mil/uL (ref 4.22–5.81)
WBC: 5.8 10*3/uL (ref 4.5–10.5)

## 2010-07-27 LAB — IBC PANEL
Saturation Ratios: 14.5 % — ABNORMAL LOW (ref 20.0–50.0)
Transferrin: 221.1 mg/dL (ref 212.0–360.0)

## 2010-07-27 LAB — HM DIABETES FOOT EXAM

## 2010-07-27 NOTE — Letter (Signed)
Summary: [facility]  [facility]   Imported By: Lester C[place] 07/21/2010 08:00:11  _____________________________________________________________________  External Attachment:    Type:   Image     Comment:   External Document

## 2010-08-03 ENCOUNTER — Encounter: Payer: Self-pay | Admitting: Internal Medicine

## 2010-08-04 NOTE — Assessment & Plan Note (Signed)
Summary: 3 MOTH FU-LB   Vital Signs:  Patient profile:   70 year old male Height:      69 inches Weight:      222 pounds BMI:     32.90 O2 Sat:      97 % on Room air Temp:     98.1 degrees F oral Pulse rate:   81 / minute Pulse rhythm:   regular Resp:     16 per minute BP sitting:   114 / 70  (left arm) Cuff size:   large  Vitals Entered By: Rock Nephew CMA (July 27, 2010 9:52 AM)  Nutrition Counseling: Patient's BMI is greater than 25 and therefore counseled on weight management options.  O2 Flow:  Room air CC: follow-up visit  Does patient need assistance? Functional Status Self care Ambulation Normal   Primary Care Provider:  Dr.Tom Yetta Barre  CC:  follow-up visit.  History of Present Illness:  Follow-Up Visit      This is a 70 year old man who presents for Follow-up visit.  The patient denies chest pain, palpitations, dizziness, syncope, low blood sugar symptoms, high blood sugar symptoms, edema, SOB, DOE, PND, and orthopnea.  Since the last visit the patient notes no new problems or concerns.  The patient reports taking meds as prescribed, monitoring BP, monitoring blood sugars, and dietary noncompliance.  When questioned about possible medication side effects, the patient notes none.    Preventive Screening-Counseling & Management  Alcohol-Tobacco     Alcohol drinks/day: 0     Alcohol Counseling: not indicated; patient does not drink     Smoking Status: quit > 6 months     Year Quit: 1976     Tobacco Counseling: to remain off tobacco products  Hep-HIV-STD-Contraception     Hepatitis Risk: no risk noted     HIV Risk: no risk noted     STD Risk: no risk noted      Sexual History:  currently monogamous.        Drug Use:  never and no.        Blood Transfusions:  no.    Clinical Review Panels:  Prevention   Last Colonoscopy:  Hyperplastic Polyp (06/04/2009)   Last PSA:  <0.01 ng/mL (11/09/2008)  Immunizations   Last Tetanus Booster:  Tdap  (09/24/2008)   Last Pneumovax:  given (06/20/2007)  Lipid Management   Cholesterol:  151 (02/08/2010)   LDL (bad choesterol):  98 (02/08/2010)   HDL (good cholesterol):  38.70 (02/08/2010)  Diabetes Management   HgBA1C:  6.6 (04/26/2010)   Creatinine:  1.3 (04/26/2010)   Last Dilated Eye Exam:  normal (07/13/2009)   Last Foot Exam:  yes (07/27/2010)   Last Pneumovax:  given (06/20/2007)  CBC   WBC:  5.8 (04/26/2010)   RBC:  4.50 (04/26/2010)   Hgb:  12.5 (04/26/2010)   Hct:  37.6 (04/26/2010)   Platelets:  192.0 (04/26/2010)   MCV  83.5 (04/26/2010)   MCHC  33.3 (04/26/2010)   RDW  15.6 (04/26/2010)   PMN:  59.5 (04/26/2010)   Lymphs:  25.5 (04/26/2010)   Monos:  11.6 (04/26/2010)   Eosinophils:  2.8 (04/26/2010)   Basophil:  0.6 (04/26/2010)  Complete Metabolic Panel   Glucose:  103 (04/26/2010)   Sodium:  143 (04/26/2010)   Potassium:  4.5 (04/26/2010)   Chloride:  108 (04/26/2010)   CO2:  28 (04/26/2010)   BUN:  29 (04/26/2010)   Creatinine:  1.3 (04/26/2010)   Albumin:  3.7 (04/26/2010)   Total Protein:  6.3 (04/26/2010)   Calcium:  9.0 (04/26/2010)   Total Bili:  0.7 (04/26/2010)   Alk Phos:  44 (04/26/2010)   SGPT (ALT):  23 (04/26/2010)   SGOT (AST):  21 (04/26/2010)   Medications Prior to Update: 1)  Coreg 25 Mg Tabs (Carvedilol) .... Take 1 Tablet By Mouth Two Times A Day 2)  Lasix 20 Mg Tabs (Furosemide) .... Take 1 Tablet By Mouth Once A Day 3)  Digoxin 0.125 Mg Tabs (Digoxin) .... Take 1 Tablet By Mouth Once A Day 4)  Lisinopril 20 Mg Tabs (Lisinopril) .... Take 1 Tablet By Mouth Two Times A Day 5)  Simvastatin 20 Mg Tabs (Simvastatin) .... 1/2 Tab At Bedtime 6)  Aspirin 81 Mg Tbec (Aspirin) .... Take One Tablet By Mouth Daily 7)  Ibuprofen 800 Mg Tabs (Ibuprofen) .... As Needed 8)  Feosol 200 (65 Fe) Mg Tabs (Ferrous Sulfate Dried) .... One By Mouth Two Times A Day With Food 9)  Tradjenta 5 Mg Tabs (Linagliptin) .... One By Mouth Once Daily For  Diabetes  Current Medications (verified): 1)  Coreg 25 Mg Tabs (Carvedilol) .... Take 1 Tablet By Mouth Two Times A Day 2)  Lasix 20 Mg Tabs (Furosemide) .... Take 1 Tablet By Mouth Once A Day 3)  Digoxin 0.125 Mg Tabs (Digoxin) .... Take 1 Tablet By Mouth Once A Day 4)  Lisinopril 20 Mg Tabs (Lisinopril) .... Take 1 Tablet By Mouth Two Times A Day 5)  Simvastatin 20 Mg Tabs (Simvastatin) .... 1/2 Tab At Bedtime 6)  Aspirin 81 Mg Tbec (Aspirin) .... Take One Tablet By Mouth Daily 7)  Ibuprofen 800 Mg Tabs (Ibuprofen) .... As Needed 8)  Feosol 200 (65 Fe) Mg Tabs (Ferrous Sulfate Dried) .... One By Mouth Two Times A Day With Food 9)  Tradjenta 5 Mg Tabs (Linagliptin) .... One By Mouth Once Daily For Diabetes  Allergies (verified): 1)  ! 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 current DR RF single       chamber implantable cardioverter-defibrillator.   03/21/2008 Hillis Range, MD    s/p hernia repair s/p laparoscopic robotic prostatectomy 6.2008  Family History: Last updated: 10/16/2008 Family History of Prostate CA 1st degree relative  - father Family History of Cardiovascular disorder The patient's father was diagnosed with prostate cancer   in his  early 57s.  He died at age 20 and did die of prostate cancer.   The patient's mother died at age 88 due to cardiovascular disease.      Social History: Last updated: 10/07/2008 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  He did smoke for three years off and on, but quit over 30   years ago.  Drinks alcohol only occasionally.      Risk Factors: Alcohol Use: 0 (07/27/2010) Exercise: yes (08/31/2008)  Risk Factors:  Smoking Status: quit > 6 months (07/27/2010)  Family History: Reviewed history from 10/16/2008 and no changes required. Family History of Prostate CA 1st degree relative  - father Family History of Cardiovascular disorder The patient's father was diagnosed with prostate cancer   in his early 39s.  He died at age 54 and did die of prostate cancer.   The patient's mother died at age 35 due to cardiovascular disease.      Social History: Reviewed history from 10/07/2008 and no changes required. 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  He did smoke for three years off and on, but quit over 30   years ago.  Drinks alcohol only occasionally.      Review of Systems       The patient complains of weight gain.  The patient denies anorexia, fever, weight loss, chest pain, syncope, dyspnea on exertion, peripheral edema, prolonged cough, headaches, hemoptysis, abdominal pain, hematuria, muscle weakness, suspicious skin lesions, difficulty walking, depression, and unusual weight change.   CV:  Denies chest pain or discomfort, fainting, fatigue, leg cramps with exertion, lightheadness, near fainting, palpitations, shortness of breath with exertion, swelling of feet, and weight gain. Endo:  Complains of weight change; denies cold intolerance, excessive hunger, excessive thirst, excessive urination,  heat intolerance, and polyuria. Heme:  Denies abnormal bruising, bleeding, enlarge lymph nodes, fevers, pallor, and skin discoloration.  Physical Exam  General:  alert, well-developed, well-nourished, well-hydrated, normal appearance, healthy-appearing, cooperative to examination, good hygiene, and overweight-appearing.   Head:  normocephalic, atraumatic, no abnormalities observed, and no abnormalities palpated.   Mouth:  Oral mucosa and oropharynx without lesions or exudates.  Teeth in good repair. Neck:  supple, full ROM, no masses, no carotid bruits, no cervical lymphadenopathy, and no neck tenderness.   Lungs:  Normal respiratory effort, chest expands symmetrically. Lungs are clear to auscultation, no crackles or wheezes. Heart:  Normal rate and regular rhythm. S1 and S2 normal without gallop, murmur, click, rub or other extra sounds. Abdomen:  Bowel sounds positive,abdomen soft and non-tender without masses, organomegaly or hernias noted. Msk:  normal ROM, no joint tenderness, no joint swelling, no joint warmth, no redness over joints, no joint deformities, no joint instability, no crepitation, and no muscle atrophy.   Pulses:  R and L carotid,radial,femoral,dorsalis pedis and posterior tibial pulses are full and equal bilaterally Extremities:  No clubbing, cyanosis, edema, or deformity noted with normal full range of motion of all joints.   Neurologic:  No cranial nerve deficits noted. Station and gait are normal. Plantar reflexes are down-going bilaterally. DTRs are symmetrical throughout. Sensory, motor and coordinative functions appear intact. Skin:  Intact without suspicious lesions or rashes Cervical Nodes:  no anterior cervical adenopathy and no posterior cervical adenopathy.   Psych:  Cognition and judgment appear intact. Alert and cooperative with normal attention span and concentration. No apparent delusions, illusions, hallucinations  Diabetes Management Exam:    Foot Exam (with  socks and/or shoes not present):       Sensory-Pinprick/Light touch:          Left medial foot (L-4): normal          Left dorsal foot (L-5): normal          Left lateral foot (S-1): normal          Right medial foot (L-4): normal          Right  dorsal foot (L-5): normal          Right lateral foot (S-1): normal       Sensory-Monofilament:          Left foot: normal          Right foot: normal       Inspection:          Left foot: normal          Right foot: normal       Nails:          Left foot: normal          Right foot: normal   Impression & Recommendations:  Problem # 1:  KIDNEY DISEASE, CHRONIC, STAGE II (ICD-585.2) Assessment Unchanged  Orders: Venipuncture (04540) TLB-B12 + Folate Pnl (98119_14782-N56/OZH) TLB-IBC Pnl (Iron/FE;Transferrin) (83550-IBC) TLB-BMP (Basic Metabolic Panel-BMET) (80048-METABOL) TLB-CBC Platelet - w/Differential (85025-CBCD) TLB-Hepatic/Liver Function Pnl (80076-HEPATIC) TLB-A1C / Hgb A1C (Glycohemoglobin) (83036-A1C)  Labs Reviewed: BUN: 29 (04/26/2010)   Cr: 1.3 (04/26/2010)    Hgb: 12.5 (04/26/2010)   Hct: 37.6 (04/26/2010)   Ca++: 9.0 (04/26/2010)    TP: 6.3 (04/26/2010)   Alb: 3.7 (04/26/2010)  Problem # 2:  DIABETES-TYPE 2 (ICD-250.00) Assessment: Improved  His updated medication list for this problem includes:    Lisinopril 20 Mg Tabs (Lisinopril) .Marland Kitchen... Take 1 tablet by mouth two times a day    Aspirin 81 Mg Tbec (Aspirin) .Marland Kitchen... Take one tablet by mouth daily    Tradjenta 5 Mg Tabs (Linagliptin) ..... One by mouth once daily for diabetes  Orders: Venipuncture (08657) TLB-B12 + Folate Pnl (84696_29528-U13/KGM) TLB-IBC Pnl (Iron/FE;Transferrin) (83550-IBC) TLB-BMP (Basic Metabolic Panel-BMET) (80048-METABOL) TLB-CBC Platelet - w/Differential (85025-CBCD) TLB-Hepatic/Liver Function Pnl (80076-HEPATIC) TLB-A1C / Hgb A1C (Glycohemoglobin) (83036-A1C)  Labs Reviewed: Creat: 1.3 (04/26/2010)     Last Eye Exam: normal  (07/13/2009) Reviewed HgBA1c results: 6.6 (04/26/2010)  7.3 (02/08/2010)  Problem # 3:  OTHER SPECIFIED IRON DEFICIENCY ANEMIAS (ICD-280.8) Assessment: Unchanged  His updated medication list for this problem includes:    Feosol 200 (65 Fe) Mg Tabs (Ferrous sulfate dried) ..... One by mouth two times a day with food  Orders: Venipuncture (01027) TLB-B12 + Folate Pnl (25366_44034-V42/VZD) TLB-IBC Pnl (Iron/FE;Transferrin) (83550-IBC) TLB-BMP (Basic Metabolic Panel-BMET) (80048-METABOL) TLB-CBC Platelet - w/Differential (85025-CBCD) TLB-Hepatic/Liver Function Pnl (80076-HEPATIC) TLB-A1C / Hgb A1C (Glycohemoglobin) (83036-A1C)  Hgb: 12.5 (04/26/2010)   Hct: 37.6 (04/26/2010)   Platelets: 192.0 (04/26/2010) RBC: 4.50 (04/26/2010)   RDW: 15.6 (04/26/2010)   WBC: 5.8 (04/26/2010) MCV: 83.5 (04/26/2010)   MCHC: 33.3 (04/26/2010) Iron: 109 (07/05/2009)   % Sat: 33.5 (07/05/2009) B12: 468 (11/18/2008)   Folate: 8.0 (11/18/2008)   TSH: 1.20 (02/08/2010)  Problem # 4:  CONGESTIVE HEART FAILURE (ICD-428.0) Assessment: Unchanged  His updated medication list for this problem includes:    Coreg 25 Mg Tabs (Carvedilol) .Marland Kitchen... Take 1 tablet by mouth two times a day    Lasix 20 Mg Tabs (Furosemide) .Marland Kitchen... Take 1 tablet by mouth once a day    Digoxin 0.125 Mg Tabs (Digoxin) .Marland Kitchen... Take 1 tablet by mouth once a day    Lisinopril 20 Mg Tabs (Lisinopril) .Marland Kitchen... Take 1 tablet by mouth two times a day    Aspirin 81 Mg Tbec (Aspirin) .Marland Kitchen... Take one tablet by mouth daily  Echocardiogram:  SUMMARY   -  Difficult apical windows. OVerall LVEF is approximately 25 to 30%         with diffuse hypokinesis.   -  There was mild  mitral valvular regurgitation. The volume of         mitral regurgitation by proximal isovelocity surface area was         15 cc.     ---------------------------------------------------------------    Prepared and Electronically Authenticated by    Olga Millers M.D.   Amended  03-Jul-2008 10:20:04  (06/29/2008)  Complete Medication List: 1)  Coreg 25 Mg Tabs (Carvedilol) .... Take 1 tablet by mouth two times a day 2)  Lasix 20 Mg Tabs (Furosemide) .... Take 1 tablet by mouth once a day 3)  Digoxin 0.125 Mg Tabs (Digoxin) .... Take 1 tablet by mouth once a day 4)  Lisinopril 20 Mg Tabs (Lisinopril) .... Take 1 tablet by mouth two times a day 5)  Simvastatin 20 Mg Tabs (Simvastatin) .... 1/2 tab at bedtime 6)  Aspirin 81 Mg Tbec (Aspirin) .... Take one tablet by mouth daily 7)  Ibuprofen 800 Mg Tabs (Ibuprofen) .... As needed 8)  Feosol 200 (65 Fe) Mg Tabs (Ferrous sulfate dried) .... One by mouth two times a day with food 9)  Tradjenta 5 Mg Tabs (Linagliptin) .... One by mouth once daily for diabetes  Patient Instructions: 1)  Please schedule a follow-up appointment in 4 months. 2)  It is important that you exercise regularly at least 20 minutes 5 times a week. If you develop chest pain, have severe difficulty breathing, or feel very tired , stop exercising immediately and seek medical attention. 3)  You need to lose weight. Consider a lower calorie diet and regular exercise.  4)  Check your blood sugars regularly. If your readings are usually above 200 or below 70 you should contact our office. 5)  It is important that your Diabetic A1c level is checked every 3 months. 6)  See your eye doctor yearly to check for diabetic eye damage. 7)  Check your feet each night for sore areas, calluses or signs of infection. 8)  Check your Blood Pressure regularly. If it is above 130/80: you should make an appointment.   Orders Added: 1)  Venipuncture [36415] 2)  TLB-B12 + Folate Pnl [82746_82607-B12/FOL] 3)  TLB-IBC Pnl (Iron/FE;Transferrin) [83550-IBC] 4)  TLB-BMP (Basic Metabolic Panel-BMET) [80048-METABOL] 5)  TLB-CBC Platelet - w/Differential [85025-CBCD] 6)  TLB-Hepatic/Liver Function Pnl [80076-HEPATIC] 7)  TLB-A1C / Hgb A1C (Glycohemoglobin) [83036-A1C] 8)  Est.  Patient Level IV [19147]

## 2010-08-10 NOTE — Letter (Signed)
Summary: Physician's Statement for Diabetes/Det of [facility]  Physician's Statement for Diabetes/Det of [facility]   Imported By: Sherian Rein 08/05/2010 09:46:35  _____________________________________________________________________  External Attachment:    Type:   Image     Comment:   External Document

## 2010-08-29 ENCOUNTER — Encounter: Payer: Self-pay | Admitting: Internal Medicine

## 2010-09-12 ENCOUNTER — Ambulatory Visit (INDEPENDENT_AMBULATORY_CARE_PROVIDER_SITE_OTHER): Payer: Medicare Other | Admitting: Internal Medicine

## 2010-09-12 ENCOUNTER — Encounter: Payer: Self-pay | Admitting: Internal Medicine

## 2010-09-12 DIAGNOSIS — Z95810 Presence of automatic (implantable) cardiac defibrillator: Secondary | ICD-10-CM

## 2010-09-12 DIAGNOSIS — I10 Essential (primary) hypertension: Secondary | ICD-10-CM

## 2010-09-12 DIAGNOSIS — I509 Heart failure, unspecified: Secondary | ICD-10-CM

## 2010-09-12 DIAGNOSIS — I428 Other cardiomyopathies: Secondary | ICD-10-CM

## 2010-09-12 DIAGNOSIS — E785 Hyperlipidemia, unspecified: Secondary | ICD-10-CM

## 2010-09-12 NOTE — Assessment & Plan Note (Signed)
He has stable chronic systolic dysfunction Salt restriction advised No changes today ICD function is normal (See paceart)

## 2010-09-12 NOTE — Assessment & Plan Note (Signed)
Stable No changes 

## 2010-09-12 NOTE — Assessment & Plan Note (Signed)
Above goal Salt restriction is advised He feels BP is controlled at home.  He is encouraged to journal BP and bring to next visit with Dr Daleen Squibb

## 2010-09-12 NOTE — Assessment & Plan Note (Signed)
Normal function No changes

## 2010-09-12 NOTE — Progress Notes (Signed)
The patient presents today for routine electrophysiology followup.  Since last being seen in our cliniche reports doing very well.  Today, he denies symptoms of palpitations, chest pain, shortness of breath, orthopnea, PND,  dizziness, presyncope, syncope, or neurologic sequela. He has stable mild edema. The patient feels that he is tolerating medications without difficulties and is otherwise without complaint today.   Past Medical History  Diagnosis Date  . Cardiac defibrillator in place   . Obesity   . Prostate cancer   . Glucose intolerance (impaired glucose tolerance)   . Bursitis of elbow     left  . Osteoarthritis   . HTN (hypertension)   . HLD (hyperlipidemia)   . History of DVT (deep vein thrombosis)   . CHF (congestive heart failure)   . Other primary cardiomyopathies    Past Surgical History  Procedure Date  . Transurethral resection of prostate   . Cardiac defibrillator placement 10/09  . Hernia repair   . Robot assisted laparoscopic radical prostatectomy 6/08    Current outpatient prescriptions:aspirin 81 MG tablet, Take 81 mg by mouth daily.  , Disp: , Rfl: ;  carvedilol (COREG) 25 MG tablet, Take 25 mg by mouth 2 (two) times daily with a meal.  , Disp: , Rfl: ;  digoxin (LANOXIN) 0.125 MG tablet, Take 125 mcg by mouth daily.  , Disp: , Rfl: ;  Ferrous Sulfate Dried (FEOSOL) 200 (65 FE) MG TABS, Take by mouth 2 (two) times daily with a meal.  , Disp: , Rfl:  furosemide (LASIX) 20 MG tablet, Take 20 mg by mouth daily.  , Disp: , Rfl: ;  ibuprofen (ADVIL,MOTRIN) 800 MG tablet, Take 800 mg by mouth every 8 (eight) hours as needed.  , Disp: , Rfl: ;  Linagliptin (TRADJENTA) 5 MG TABS, Take 5 mg by mouth daily.  , Disp: , Rfl: ;  lisinopril (PRINIVIL,ZESTRIL) 20 MG tablet, Take 20 mg by mouth 2 (two) times daily.  , Disp: , Rfl:  simvastatin (ZOCOR) 20 MG tablet, Take 10 mg by mouth at bedtime.  , Disp: , Rfl:   No Known Allergies  History   Social History  . Marital Status:  Married    Spouse Name: N/A    Number of Children: N/A  . Years of Education: N/A   Occupational History  . Paramedic    Social History Main Topics  . Smoking status: Former Games developer  . Smokeless tobacco: Not on file   Comment: quit over 30 years ago   . Alcohol Use: Yes     occasionally  . Drug Use: No  . Sexually Active: Not on file   Other Topics Concern  . Not on file   Social History Narrative  . No narrative on file    Family History  Problem Relation Age of Onset  . Cancer Father     prostate  . Heart disease Mother   . Asthma Father     ROS-  All systems are reviewed and are negative except as outlined in the HPI above   Physical Exam: Filed Vitals:   09/12/10 1110 09/12/10 1126  BP: 150/70 132/78  Pulse: 84   Resp: 18   Height: 5\' 9"  (1.753 m)   Weight: 221 lb (100.245 kg)     GEN- The patient is well appearing, alert and oriented x 3 today.   Head- normocephalic, atraumatic Eyes-  Sclera clear, conjunctiva pink Ears- hearing intact Oropharynx- clear Neck- supple, no JVP Lymph- no  cervical lymphadenopathy Lungs- Clear to ausculation bilaterally, normal work of breathing Chest- ICD pocket is well healed Heart- Regular rate and rhythm, no murmurs, rubs or gallops, PMI not laterally displaced GI- soft, NT, ND, + BS Extremities- no clubbing, cyanosis, + trace edema MS- no significant deformity or atrophy Skin- no rash or lesion Psych- euthymic mood, full affect Neuro- strength and sensation are intact

## 2010-10-03 LAB — DIGOXIN LEVEL: Digoxin Level: 0.5 ng/mL — ABNORMAL LOW (ref 0.8–2.0)

## 2010-10-03 LAB — CBC
HCT: 29.5 % — ABNORMAL LOW (ref 39.0–52.0)
HCT: 30.1 % — ABNORMAL LOW (ref 39.0–52.0)
HCT: 31 % — ABNORMAL LOW (ref 39.0–52.0)
HCT: 31.8 % — ABNORMAL LOW (ref 39.0–52.0)
MCHC: 32.9 g/dL (ref 30.0–36.0)
MCV: 81 fL (ref 78.0–100.0)
MCV: 81.3 fL (ref 78.0–100.0)
MCV: 82 fL (ref 78.0–100.0)
Platelets: 127 10*3/uL — ABNORMAL LOW (ref 150–400)
Platelets: 131 10*3/uL — ABNORMAL LOW (ref 150–400)
Platelets: 135 10*3/uL — ABNORMAL LOW (ref 150–400)
Platelets: 137 10*3/uL — ABNORMAL LOW (ref 150–400)
RBC: 3.82 MIL/uL — ABNORMAL LOW (ref 4.22–5.81)
RBC: 3.95 MIL/uL — ABNORMAL LOW (ref 4.22–5.81)
RDW: 15.7 % — ABNORMAL HIGH (ref 11.5–15.5)
WBC: 6.1 10*3/uL (ref 4.0–10.5)
WBC: 6.2 10*3/uL (ref 4.0–10.5)
WBC: 6.4 10*3/uL (ref 4.0–10.5)

## 2010-10-03 LAB — BASIC METABOLIC PANEL
BUN: 23 mg/dL (ref 6–23)
BUN: 23 mg/dL (ref 6–23)
BUN: 26 mg/dL — ABNORMAL HIGH (ref 6–23)
BUN: 46 mg/dL — ABNORMAL HIGH (ref 6–23)
CO2: 22 mEq/L (ref 19–32)
Chloride: 113 mEq/L — ABNORMAL HIGH (ref 96–112)
Chloride: 114 mEq/L — ABNORMAL HIGH (ref 96–112)
Chloride: 115 mEq/L — ABNORMAL HIGH (ref 96–112)
Creatinine, Ser: 1.77 mg/dL — ABNORMAL HIGH (ref 0.4–1.5)
GFR calc Af Amer: 47 mL/min — ABNORMAL LOW (ref >60)
GFR calc non Af Amer: 39 mL/min — ABNORMAL LOW (ref >60)
Glucose, Bld: 102 mg/dL — ABNORMAL HIGH (ref 70–99)
Glucose, Bld: 98 mg/dL (ref 70–99)
Potassium: 4.4 mEq/L (ref 3.5–5.1)
Potassium: 4.7 mEq/L (ref 3.5–5.1)
Potassium: 4.9 mEq/L (ref 3.5–5.1)
Potassium: 4.9 mEq/L (ref 3.5–5.1)

## 2010-10-03 LAB — PROTIME-INR: INR: 1.2 (ref 0.00–1.49)

## 2010-10-03 LAB — HEPARIN LEVEL (UNFRACTIONATED)
Heparin Unfractionated: 0.54 IU/mL (ref 0.30–0.70)
Heparin Unfractionated: 0.93 IU/mL — ABNORMAL HIGH (ref 0.30–0.70)
Heparin Unfractionated: 1.05 IU/mL — ABNORMAL HIGH (ref 0.30–0.70)

## 2010-10-03 LAB — TROPONIN I: Troponin I: 0.03 ng/mL (ref 0.00–0.06)

## 2010-11-01 NOTE — Op Note (Signed)
NAMEADRIEL, KESSEN                  ACCOUNT NO.:  [id number]   MEDICAL RECORD NO.:  [id number]          PATIENT TYPE:  INP   LOCATION:  3731                         FACILITY:  [facility]   PHYSICIAN:  Hillis Range, MD       DATE OF BIRTH:  1941-01-31   DATE OF PROCEDURE:  03/21/2008  DATE OF DISCHARGE:                               OPERATIVE REPORT   SURGEON:  Hillis Range, MD   PREPROCEDURE DIAGNOSES:  1. Nonischemic cardiomyopathy (ejection fraction 25-30%).  2. New York Heart Association class II heart failure.  3. Hypertension.   POSTPROCEDURE DIAGNOSES:  1. Nonischemic cardiomyopathy (ejection fraction 25-30%).  2. New York Heart Association class II heart failure.  3. Hypertension.   PROCEDURES:  1. Implantable cardioverter-defibrillator implantation.  2. Left upper extremity venography.  3. Defibrillation threshold testing.  4. Programmed extrastimulus testing from the right ventricular apex.   DESCRIPTION OF PROCEDURE:  Informed written consent was obtained and the  patient was brought to the electrophysiology lab in the fasting state.  He was adequately sedated with intravenous Versed and fentanyl as  outlined in the nursing report.  The patient's left chest was prepped  and draped in the usual sterile fashion by the EP lab staff.  The skin  overlying the left deltopectoral region was infiltrated with local  lidocaine for anesthetic.  A 5-cm incision was then made over the left  deltopectoral region.  A subcutaneous defibrillator pocket was fashioned  using a combination of sharp and blunt dissection.  Electrocautery was  used to assure hemostasis.  A cephalic vein could not be visualized.  A  venogram of the left upper extremity was therefore performed using hand  injection of nonionic contrast.  This demonstrated a patent left  axillary and left subclavian venous system.  Using a modified  percutaneous Seldinger technique, the left axillary vein was accessed  and  through this vein, a [employer]. [employer] Durata, model 7121 - 65  (serial #[id number]) right ventricular defibrillator lead was advanced  into the right ventricular apex position under fluoroscopic  visualization.  The lead was actively fixed to the myocardium in this  location.  The lead was then secured to the pectoralis fascia using #2  silk suture over the suture sleeve.  Initial lead measurements revealed  an R-wave of 13.9 mV with an impedance of 1031 ohms and a threshold of  0.6 volts at 0.5 milliseconds.  The defibrillator pocket was then  irrigated with copious gentamicin solution and hemostasis was again  assured.  The lead was then connected to a [employer]. Jude Medical current DR  RF, model 1207 - 36 (serial [id number]) single chamber defibrillator.  The  defibrillator was placed into the left subcutaneous pocket.  Initial  lead measurements through the device revealed an R-wave of 12 mV with an  impedance of 690 ohms and a threshold of 0.5 volts at 0.5 milliseconds.  The pocket was then closed in 2 layers with 2.0 Vicryl suture for the  subcutaneous and subcuticular layers.  Ventricular fibrillation was then  induced with a T  shock.  Adequate sensing of ventricular fibrillation  was observed with a sensitivity programmed at 1 mV.  The patient was  successfully defibrillated with a single 15-joules shock with an  impedance of 45 ohms and a charge time of 6 seconds.  Programmed  extrastimulus testing was then performed through the device from the  right ventricular apex down to a cycle length of 500/260/250/200  milliseconds with no inducible ventricular tachycardia or ventricular  fibrillation.  Steri-Strips and a sterile bandage were then applied.  The procedure was then considered completed.  There were no early  apparent complications.   CONCLUSIONS:  1. Successful implantation of a [employer]. [employer] Medical current DR RF single      chamber implantable cardioverter-defibrillator.  2.  Defibrillation threshold testing less than or equal to 15 joules.  3. No inducible ventricular tachycardia or ventricular fibrillation      with programmed extrastimulus testing.  4. No early apparent complications.      Hillis Range, MD  Electronically Signed     JA/MEDQ  D:  04/21/2008  T:  04/22/2008  Job:  [id number]

## 2010-11-01 NOTE — Letter (Signed)
April 10, 2008    [name]. Gary Squibb, MD, [facility]  [address]  [place], [place].   RE:  [name], Gary  MRN:  [id number]  /  DOB:  [birth year]   Dear [name]:   It was my pleasure to see [name] in EP consultation today regarding  risk stratification for sudden cardiac death.  As you recall, he is a  very pleasant 70 year old gentleman with a nonischemic cardiomyopathy,  New York Heart Association class II heart failure symptoms chronically,  with an ejection fraction of 25-30%.  He reports initially being  diagnosed with his cardiomyopathy in the mid 1990s.  A left heart  catheterization at that time revealed no significant coronary artery  disease.  He subsequently been treated with optimal medical therapy  without improvement in his global left ventricular function.  He reports  doing rather well despite his cardiomyopathy.  He is active around his  yard, cutting the grass, and working on cars.  He does note dyspnea and  fatigue with moderate activities.  He denies orthopnea, PND,  palpitations, presyncope, or syncope.  He is otherwise without complaint  today.   PAST MEDICAL HISTORY:  1. Nonischemic cardiomyopathy.  2. New York Heart Association class II heart failure symptoms      chronically.  3. Hypertension.  4. Obesity.  5. Hyperlipidemia.  6. History of prostate cancer, status post robotic prostatectomy.  7. History of bilateral lower extremity wounds with shrapnel from      [place].   Transthoracic echocardiogram on March 30, 2008, revealed a left  ventricular ejection fraction 25-30% with severe global hypokinesis.  The left ventricular end-diastolic dimension is 64 mm.  The left atrial  size is 45 mm.  Diastolic dysfunction is also noted with moderate mitral  regurgitation.   MEDICATIONS:  1. Carvedilol 25 mg b.i.d.  2. Aspirin 81 mg daily.  3. Multivitamin daily.  4. Simvastatin 10 mg q.h.s.  5. Lasix 20 mg daily.  6. Lisinopril 20 mg b.i.d.  7.  Spirolactone 25 mg daily.  8. Digoxin 0.125 mg daily.   ALLERGIES:  The patient reports skin blistering with TAPE, but denies  other allergies.   SOCIAL HISTORY:  The patient lives in [place].  He is retired from  the post office.  He was combat in [place].  He drinks one beer per week  and denies tobacco or drug use.   FAMILY HISTORY:  The patient's father died of prostate cancer at age 75.  The patient's mother died of myocardial infarction at age 14.  He denies  any familial arrhythmias or cardiomyopathies.   REVIEW OF SYSTEMS:  All systems are reviewed and negative except as  outlined in the HPI above.   PHYSICAL EXAMINATION:  VITALS:  Blood pressure 122/80, heart rate 73,  respirations 18, weight 222 pounds.  GENERAL:  The patient is a well-appearing male in no acute distress.  He  is alert and oriented x3.  HEENT:  Normocephalic, atraumatic.  Sclerae clear.  Conjunctivae pink.  Oropharynx clear.  NECK:  Supple.  No JVD, lymphadenopathy, or bruits.  LUNGS:  Clear to auscultation bilaterally.  HEART:  Regular rate and rhythm.  No murmurs, rubs, or gallops.  GI:  Soft, nontender, nondistended.  Positive bowel sounds.  EXTREMITIES:  No clubbing, cyanosis, or edema.  NEUROLOGIC:  Strength and sensation are intact.  PSYCH:  Euthymic mood, full affect.  SKIN:  No ecchymosis or lacerations.  MUSCULOSKELETAL:  The patient has  chronic deformities of his right lower  leg, status post complex tib-fib fracture in [place].  He also has  chronic deformities to his left foot.   EKG, normal sinus rhythm at 73 beats per minute with nonspecific ST/T-  wave changes.  The QRS duration measures 88 milliseconds.  The QT  interval was 416 milliseconds.   IMPRESSION:  Mr. Ahmere is a very pleasant 70 year old gentleman with a  nonischemic cardiomyopathy, New York Heart Association class II heart  failure, and an ejection fraction of 25-30%, who presents today for  further risk stratification  of sudden cardiac death.  Tom, I agree with  you that he meets __________ criteria for an implantable cardioverter-  defibrillator given his increased risk for sudden cardiac death.   PLAN:  Therapeutic strategies for primary prevention of sudden cardiac  death including ICD implantation were discussed in detail with the  patient today.  Risks, benefits, and alternatives to ICD implantation  were also discussed in detail today including bleeding, infection,  pneumothorax and pericardial effusion with tamponade, and renal failure.  The patient understands these risks and wishes to proceed.  We will  therefore schedule him for ICD implantation at the next available time.     Sincerely,      Hillis Range, MD  Electronically Signed    JA/MedQ  DD: 04/10/2008  DT: 04/11/2008  Job #: [id number]

## 2010-11-01 NOTE — Discharge Summary (Signed)
[name], [name]                  ACCOUNT NO.:  [id number]   MEDICAL RECORD NO.:  [id number]          PATIENT TYPE:  INP   LOCATION:  3731                         FACILITY:  [facility]   PHYSICIAN:  Hillis Range, MD       DATE OF BIRTH:  1940-10-11   DATE OF ADMISSION:  04/21/2008  DATE OF DISCHARGE:                               DISCHARGE SUMMARY   This patient has no known drug allergies.   PRIMARY CARE PHYSICIAN:  Quita Skye. Kindl, MD   FINAL DIAGNOSES:  1. Discharging day #1, status post implant of St. Jude CURRENT VR RF      single-chamber cardioverter defibrillator.  Defibrillator threshold      is less than or equal to 15 joules.  2. Nonischemic cardiomyopathy with no improvement in ejection fraction      from catheterization in 1996.   SECONDARY DIAGNOSES:  1. Nonischemic cardiomyopathy, ejection fraction noted on      catheterization in 1996 to be 25%-30%.  2. Recent 2-D echocardiogram on March 30, 2008, ejection fraction      25%-30%.  3. New York Heart Association class II chronic systolic congestive      heart failure.  4. Hypertension.  5. Obesity.  6. Dyslipidemia.  7. History of prostate cancer, status post robotic prostatectomy.  8. Bilateral lower extremity wounds from shrapnel in [place].    Dictation ended at this point.      Maple Mirza, [place]      Hillis Range, MD  Electronically Signed    GM/MEDQ  D:  04/22/2008  T:  04/23/2008  Job:  [id number]   cc:   Quita Skye. Artis Flock, M.D.  Thomas C. Wall, MD, [facility]

## 2010-11-01 NOTE — Assessment & Plan Note (Signed)
[facility]                            CARDIOLOGY OFFICE NOTE   KENDEN, BRANDT                         MRN:          [id number]  DATE:03/15/2007                            DOB:          1941-05-08    Mr. Depaulo comes in today for close followup of his nonischemic  cardiomyopathy and chronic systolic heart failure.   He says he is feeling better and better.  He is back at work.  His  weight has dropped an additional 2 pounds.  He denies any orthopnea,  PND.  He has had very little peripheral edema.   MEDICATIONS:  1. Coreg 25 b.i.d.  2. Multivitamin daily.  3. Aspirin 81 mg daily.  4. Simvastatin 10 mg nightly.  5. Furosemide 20 mg a day.  6. Digoxin 0.125 q. a.m.  7. Lisinopril 20 mg p.o. b.i.d.   PHYSICAL EXAM:  Blood pressure is 118/79, pulse running about 80 and  regular, last time it was 60 and regular.  His weight is 210.  Respiratory rate is 18.  In no acute distress.  SKIN:  Warm and dry.  HEENT:  Unchanged.  Carotids are full.  There is no JVD.  LUNGS:  Clear.  HEART:  No S3 gallop.  ABDOMEN:  Protuberant.  Good bowel sounds.  EXTREMITIES:  Only trace edema in the right lower extremity.  Pulses are  present.   I have had a nice chat with Mr. Castagna.  We will check a CHEM-7 today.  His creatinine and potassium are normal.  We will add spironolactone 25  mg a day.  I will plan on seeing him back in 3 months.  Once we add  spironolactone, he will need as CHEM-7 again in a couple of weeks.     Thomas C. Daleen Squibb, MD, [facility]  Electronically Signed    TCW/MedQ  DD: 03/15/2007  DT: 03/15/2007  Job #: [id number]   cc:   Quita Skye. Artis Flock, M.D.

## 2010-11-01 NOTE — Assessment & Plan Note (Signed)
[facility]                            CARDIOLOGY OFFICE NOTE   GRAFTON, WARZECHA                         MRN:          [id number]  DATE:01/09/2007                            DOB:          [birth year]    CARDIOLOGY CONSULTATION   I was asked by Quita Skye. Kindl, M.D. to reevaluate and re-consult on  [name] for increasing shortness of breath.   HISTORY OF PRESENT ILLNESS:  [name] is a 70 year old black gentleman  whom I had seen 5 or 6 years ago.  We are unable to retrieve his chart  this morning from the [place] office, and he is certainly not on the  electronic record.   Unfortunately, he also does not bring any of the dosages of his  medications.  He does know the list in general, however.   Over the past few weeks, he has noted increased dyspnea on exertion,  particularly while trying to wash his car.  He has also had generalized  fatigue.  He denies any orthopnea, PND, or increased peripheral edema.  He has had no chest discomfort and no anginal symptoms.  He denies any  syncope or pre-syncope.  He has had no palpitations.   PAST MEDICAL HISTORY:  Apparently, he has a history of a global  hypokinetic ventricle, which is nonischemic, per Dr. Artis Flock.   CURRENT MEDICATIONS:  Coreg hydrochlorothiazide.  Lipitor.  Multivitamin.  Aspirin.  Lisinopril.  He does not know any of the  dosages.   He is allergic to STERILE TAPE.   He does not smoke.  He quit in 1977.  He does not use any recreational  drugs.  He does not drink any alcohol.  He drinks very little caffeine.  He enjoys walking.   SURGICAL HISTORY:  He has had a hernia repair in 1980.  He just had  robotic surgery for prostate cancer with a laparoscopic radical  prostatectomy December 03, 2006.   His family history is really noncontributory.  His mother did die at 28  of congestive heart failure.  Etiology unknown.   SOCIAL HISTORY:  He still works as a Doctor, hospital.  He is going to  retire in 3 months.  He is married and has 3 children.  He is a [place]  Veteran and was injured severely with shrapnel.  Despite having major  limitations, he still works, which I greatly admire.   REVIEW OF SYSTEMS:  Other than the HPI, is really negative.  All other  systems are reviewed and are negative.   EXAM:  Blood pressure is 125/85, pulse 88 and regular, weight is 210.  He is 5 feet 9 inches.  He is in no acute distress.  Very pleasant.  Alert and oriented x3.  HEENT:  Normocephalic, atraumatic.  PERRLA.  Extraocular movements are  intact.  Arcus senilis.  Sclerae muddy.  Facial symmetry is normal.  Dentition is satisfactory.  NECK:  Supple.  Carotid upstrokes are equal bilaterally without bruits.  There is no JVD.  Thyroid is not enlarged.  Trachea is midline.  LUNGS:  Clear to auscultation.  HEART:  Reveals a poorly-appreciated PMI.  He has normal S1, S2 without  gallop.  A soft systolic murmur along the left sternal border.  ABDOMEN:  Soft with good bowel sounds.  He has a bandage suprapubically,  which is clean and not draining.  He has no hepatomegaly.  There is no  midline bruit.  EXTREMITIES:  No edema.  Pulses were intact.  He has got some injury to  his left foot from the [place] injury.  NEUROLOGIC:  Intact.  SKIN:  Unremarkable.   ASSESSMENT AND PLAN:  Probable nonischemic cardiomyopathy.  His  functional classification has declined.  We have not really objectively  assessed him in about 5 to 6 years.   He is on an excellent medical program as best I can tell.  However, I do  not know the dosages, so I cannot make any adjustments.  He is not  decompensating today and I think we can wait until he brings these back  in or calls them in for adjustments.  In addition, we will obtain a 2D  echocardiogram to assess his left ventricular function, any question of  pulmonary hypertension, and I will then see him back in close followup  to make any other changes or  recommendations.     Thomas C. Daleen Squibb, MD, [facility]  Electronically Signed    TCW/MedQ  DD: 01/09/2007  DT: 01/09/2007  Job #: [id number]   cc:   Quita Skye. Artis Flock, M.D.

## 2010-11-01 NOTE — Assessment & Plan Note (Signed)
[facility]                            CARDIOLOGY OFFICE NOTE   CASY, BRUNETTO                         MRN:          [id number]  DATE:09/05/2007                            DOB:          [birth year]    HISTORY:  Mr. Clemon returns today for further management of the following  issues:  1. Nonischemic cardiomyopathy.  2. Chronic left ventricular systolic dysfunction.  3. Currently class II congestive heart failure.  4. Obesity.   He has gained about 10 pounds over the winter, but it has all been body  fat.  He says he has got to get it off!  He is now retired and has a lot  more leisure.  He denies any orthopnea, PND or peripheral edema.  His  dyspnea on exertion is stable.   MEDICATIONS:  1. Carvedilol 25 mg p.o. b.i.d.  2. Multivitamin daily.  3. Aspirin 81 mg daily.  4. Simvastatin 10 mg q.h.s.  5. Furosemide 20 mg q.a.m.  6. Digoxin 0.125 mg a day.  7. Lisinopril 20 mg p.o. b.i.d.  8. Spirolactone 25 mg a day.  His potassium was stable the last time      we checked it.   PHYSICAL EXAMINATION:  VITAL SIGNS:  Blood pressure 111/58, pulse 67 and  regular.  His weight is 217, up 9.  HEENT:  Unchanged.  NECK:  Shows no obvious JVD.  Carotids are full without bruits.  Thyroid  is not enlarged.  LUNGS:  Clear.  HEART:  Reveals a displaced PMI.  He has normal S1-S2 without gallop.  ABDOMEN:  Protuberant, good bowel sounds.  No obvious midline or flank  bruit.  EXTREMITIES:  Reveal trace edema.  Pulses are intact.  NEURO:  Intact.  SKIN:  Unremarkable.   DISCUSSION:  I had a long talk with Mr. Pridgeon today.  I have asked him to  try to drop about 10 pounds.  We renewed his medications.  We will check  a BMP to follow up on his potassium and kidney function.  I will plan on  seeing him back in 6 months.     Thomas C. Daleen Squibb, MD, [facility]  Electronically Signed    TCW/MedQ  DD: 09/05/2007  DT: 09/05/2007  Job #: [id number]

## 2010-11-01 NOTE — Discharge Summary (Signed)
[name], [name]                  ACCOUNT NO.:  [id number]   MEDICAL RECORD NO.:  [id number]          PATIENT TYPE:  INP   LOCATION:  1444                         FACILITY:  [facility]   PHYSICIAN:  Heloise Purpura, MD      DATE OF BIRTH:  [birth year]   DATE OF ADMISSION:  12/03/2006  DATE OF DISCHARGE:  12/04/2006                               DISCHARGE SUMMARY   ADMISSION DIAGNOSIS:  Prostate cancer.   DISCHARGE DIAGNOSIS:  Prostate cancer.   PROCEDURE:  Robotic assisted laparoscopic radical prostatectomy.   HISTORY OF PRESENT ILLNESS:  [name] is a 70 year old gentleman with  clinically localized prostate cancer.  After undergoing a discussion  regarding his treatment options, he elected to proceed with surgical  therapy and the above listed procedure.   For full details of his History and Physical, please see admission note.   HOSPITAL COURSE:  The patient was taken to the operating room on December 03, 2006 and underwent the above procedure which he tolerated well  without complications.  Postoperatively he was able to be transferred to  a regular hospital room following recovery from anesthesia.  He was able  to begin ambulating the night of surgery and was monitored carefully.  On the morning of postoperative day number 1, he was noted to have low  urine output.  His electrolytes and renal function were checked, and a  serum creatinine was found to be 1.5.  He was given an intravenous fluid  bolus which he responded to with increased urine output over the course  of the morning and averaged approximately 50 mL/hour of urine output.  He continued to have minimal output from his pelvic drain which was  therefore removed.  His hemoglobin remained stable.  He began a clear  liquid diet on the morning of postoperative day number 1 which he  tolerated well and was able to be transitioned to oral pain medication.  He was therefore discharged home in excellent condition on postoperative  day number 1.   DISPOSITION:  Home.   DISCHARGE MEDICATIONS:  The patient was instructed to resume his regular  home medications except any aspirin, nonsteroidal anti-inflammatory  drugs or herbal supplements.  He was given a prescription to take  Vicodin as needed for pain and told to use Colace as a stool softener.  He was also given a prescription to begin Cipro 1 day prior to his  return visit for Foley catheter removal.   FOLLOW UP:  He will follow up in 1 week for removal of Foley catheter  and to discuss his surgical pathology in detail.          ______________________________  Heloise Purpura, MD  Electronically Signed    LB/MEDQ  D:  12/04/2006  T:  12/05/2006  Job:  [id number]

## 2010-11-01 NOTE — Assessment & Plan Note (Signed)
[facility]                         ELECTROPHYSIOLOGY OFFICE NOTE   MCCARTNEY, CHUBA                         MRN:          [id number]  DATE:07/03/2008                            DOB:          [birth year]    INTRODUCTION:  [name] is a pleasant 70 year old gentleman with a  nonischemic cardiomyopathy (ejection fraction 25-30%), New York Heart  Association class II heart failure symptoms chronically status post  implantation of a [employer] current DR RF single-chamber ICD on  March 21, 2008, who presents today for followup.   PROBLEM LIST:  1. Nonischemic cardiomyopathy (ejection fraction 25-30%).  2. New York Heart Association class II heart failure.  3. Hypertension.  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  5. Hyperlipidemia.  6. History of prostate cancer status post robotic prostatectomy.  7. History of bilateral lower extremity wound status post shrapnel      from [place].   CURRENT MEDICATIONS:  1. Coreg 25 mg b.i.d.  2. Multivitamin daily.  3. Aspirin 81 mg daily.  4. Lasix 20 mg daily.  5. Digoxin 0.25 mg daily.  6. Lisinopril 20 mg b.i.d.  7. Coumadin.  8. Simvastatin 10 mg at bedtime.   Transthoracic echocardiogram from June 29, 2008, reveals a left  ventricular ejection fraction of 25-30% with a left ventricular end-  diastolic dimension of 58 mm.  The interventricular septum measures 11  mm.  Mild mitral regurgitation and trivial tricuspid regurgitation are  noted.  There is no significant mechanical entrapment of the tricuspid  valve biV defibrillator lead.   INTERVAL HISTORY:  [name] presents today for electrophysiology  followup.  He was recently admitted to [facility] with left  hand swelling on June 19, 2008.  A venogram was performed by Dr.  Graciela Husbands, which revealed occlusion of the left subclavian vein.  He  therefore was placed on  Coumadin for 3 months.  He has done well since  that time.  He has had no difficulties with bleeding on Coumadin.  He  also reports a resolution of left arm and hand swelling.  During his  hospital stay, a chest x-ray was performed on June 19, 2008, which  revealed forward migration of the right ventricular defibrillator lead  with a prominent loop of the lead within the right atrium.  I,  therefore, had [name] come back to our clinic for a repeat  echocardiogram which was performed on June 29, 2008, as described  above which revealed no significant valvular entrapment.  No significant  valvular entrapment.   The patient reports doing very well recently.  He denies chest pain,  shortness of breath, orthopnea, PND, palpitations, or other concerns.   PHYSICAL EXAMINATION:  VITAL SIGNS:  Blood pressure 102/70, heart rate  84, respirations 18, weight 220 pounds.  GENERAL:  The patient is a well-appearing male in no acute distress.  He  is alert and oriented x3.  HEENT:  Normocephalic, atraumatic.  Sclerae clear.  Conjunctivae pink.  Oropharynx clear.  NECK:  Supple.  No JVD, lymphadenopathy, thyromegaly, or bruits.  The  patient's pacemaker site is well healed without any evidence of  infection or hematoma.  LUNGS:  Clear to auscultation bilaterally.  HEART:  Regular rate and rhythm.  No murmurs, rubs, or gallops.  GI:  Soft, nontender, nondistended, positive bowel sounds.  EXTREMITIES:  No clubbing, cyanosis, or edema.  NEUROLOGIC:  Nonfocal.  SKIN:  No ecchymosis or lacerations.  MUSCULOSKELETAL:  No deformity or atrophy.   DEVICE INTERROGATION:  The patient's single-chamber defibrillator was  interrogated today and found to be functioning appropriately and the VVI-  pacing mode with a lower rate limit of 40 beats per minute.  The battery  status is good with a voltage of the greater than 3.2 volts and a charge  time of 9.4 seconds.  The right ventricular lead R-wave measures  12 mV  with an impedance of 430 ohms and a threshold of 0.75 volts at 0.5  milliseconds.  No ventricular arrhythmias have been detected and no  device therapies were delivered.  The right ventricular pacing output  was decreased to 0.5 volts at 0.4 milliseconds.  The patient is noted to  be paced in the right ventricle less than 1% at that time.   IMPRESSION:  [name] is a very pleasant 70 year old gentleman with a  nonischemic cardiomyopathy status post recent implantation of a single-  chamber ICD by me on March 21, 2008.  He has subsequently been found to  have occlusion of the left subclavian vein and therefore has been  initiated on Coumadin for 3 months.  A recent x-ray reveals forward  migration of the right ventricular defibrillator lead; however, an  echocardiogram demonstrates no mechanical entrapment of the valve and no  significant valvular regurgitation.  I therefore think that we should  continue to observe the patient for any further migration of his lead.  As there is no valvular involvement at this time, I do not think that we  should reposition the lead as this would carry an increased risk for  infection.  We will have the patient return to clinic in 2 months.  At  that time if his lead remains stable, then I do not think that there  would be further concern.  The patient's right ventricular pacing output  was decreased as described above.  No other changes were made today.   PLAN:  1. No medication changes today.  2. The patient will follow up in my office in 2 months.  We will      discontinue Coumadin if he is      doing well at that time.  We will also obtain a chest x-ray at that      time to evaluate for stable right ventricular lead position.     Hillis Range, MD  Electronically Signed    JA/MedQ  DD: 07/03/2008  DT: 07/04/2008  Job #: [id number]   cc:   Thomas C. Wall, MD, [facility]

## 2010-11-01 NOTE — H&P (Signed)
NAMEJARRAD, [name]                  ACCOUNT NO.:  [id number]   MEDICAL RECORD NO.:  [id number]          PATIENT TYPE:  INP   LOCATION:  NA                           FACILITY:  [facility]   PHYSICIAN:  Heloise Purpura, MD      DATE OF BIRTH:  February 05, 1941   DATE OF ADMISSION:  12/03/2006  DATE OF DISCHARGE:                              HISTORY & PHYSICAL   CHIEF COMPLAINT:  Prostate cancer.   HISTORY OF PRESENT ILLNESS:  The patient is a 70 year old gentleman with  clinical stage T2-8 prostate cancer with a prostatic specific antigen of  5.3 with a Gleason score of 3+3=6; 10-15% of the left-sided biopsy  specimens were involved.  After discussing management options for  clinically localized prostate cancer, the patient elected to proceed  with surgical therapy in a robotic radical prostatectomy.   PAST MEDICAL HISTORY:  1. Hypercholesterolemia.  2. Hypertension.  3. Arthritis.   PAST SURGICAL HISTORY:  1. Left foot surgery.  2. Right leg surgery.  3. The patient had an upper midline scar that was supposedly due to an      exploratory laparotomy due to a traumatic injury in [place].  4. Right inguinal hernia repair.  5. Removal of foreign body from left leg.   MEDICATIONS:  1. Carvedilol.  2. Hydrochlorothiazide.  3. Lisinopril.  4. Simvastatin.   ALLERGIES:  No known drug allergies.   FAMILY HISTORY:  The patient's father was diagnosed with prostate cancer  in his early 59s.  He died at age 51 and did die of prostate cancer.  The patient's mother died at age 33 due to cardiovascular disease.   SOCIAL HISTORY:  The patient works as a Paramedic.  He is currently  married.  He did smoke for three years off and on, but quit over 30  years ago.  Drinks alcohol only occasionally.   REVIEW OF SYSTEMS:  Pertinent positives include a recent history of  fatigue, shortness of breath, and headaches.  All other systems are  reviewed and are otherwise negative.   PHYSICAL EXAMINATION:   GENERAL APPEARANCE:  Alert and oriented, in no  acute distress.  CARDIOVASCULAR:  Regular rate and rhythm without obvious murmurs.  LUNGS:  Clear bilaterally.  ABDOMEN:  The patient has a well-healed upper midline scar extending to  a point approximately two-thirds of the way from his xiphoid process to  the umbilicus.  His abdomen is otherwise soft, nontender, nondistended  without abdominal masses.  RECTAL:  On digital rectal exam, the prostate does demonstrate some  induration along the left lateral aspects.  No distinct nodularity is  noted.   IMPRESSION:  Low risk clinically localized prostate cancer.   PLAN:  The patient will undergo robotic assisted laparoscopic radical  prostatectomy and then be admitted to the hospital for routine  postoperative care.           ______________________________  Heloise Purpura, MD  Electronically Signed     LB/MEDQ  D:  12/03/2006  T:  12/03/2006  Job:  [id number]

## 2010-11-01 NOTE — H&P (Signed)
[name], [name]                  ACCOUNT NO.:  [id number]   MEDICAL RECORD NO.:  [id number]          PATIENT TYPE:  INP   LOCATION:  2013                         FACILITY:  [facility]   PHYSICIAN:  Vania Rea, M.D. DATE OF BIRTH:  Jan 23, 1941   DATE OF ADMISSION:  06/18/2008  DATE OF DISCHARGE:                              HISTORY & PHYSICAL   PRIMARY CARE PHYSICIAN:  Quita Skye. Artis Flock, M.D.   CARDIOLOGIST:  Jesse Sans. Daleen Squibb, MD, FACC, Hillis Range, M.D.   UROLOGIST:  Heloise Purpura, M.D.   CHIEF COMPLAINT:  Sudden onset of pain and swelling in the left upper  extremity since this afternoon.   HISTORY OF PRESENT ILLNESS:  This is a 70 year old African American  gentleman with a history of hypertension, nonischemic cardiomyopathy  with an ejection fraction of 25% and remote history of prostate cancer,  status post prostatectomy, who had an AICD implanted on April 21, 2008  and has been doing very well since then until development of the above  symptoms.  Patient took some aspirin but noticed that his left arm was  feeling tighter and tighter as it became more swollen.  Eventually came  to the emergency room, where he was evaluated by the emergency room  physician, diagnosed with a deep venous thrombosis in the absence of  Dopplers because it is a holiday weekend.  The hospitalist service was  called to assist with management.  Other than this, patient denies any  problem.  He is having no chest pain or difficulty breathing.  He is  having no orthopnea.   PAST MEDICAL HISTORY:  1. [place] Class II heart failure, ejection fraction 20-25%.  2. Hypertension.  3. CA of the prostate.  4. Status post prostatectomy in June, 2008.  5. Hyperlipidemia.  6. Obesity.  7. Bilateral lower extremity wounds from shrapnel in [place].   MEDICATIONS:  1. Coreg 25 mg twice daily.  2. Aspirin 81 mg daily.  3. Simvastatin 10 mg at bedtime.  4. Lasix 20 mg daily.  5. Digoxin 0.125 mg daily.  6.  Lisinopril 20 mg twice daily.  7. Spironolactone 25 mg daily.   ALLERGIES:  No known drug allergies.  He is allergic to STERILE TAPE  when applied to his skin.   SOCIAL HISTORY:  Denies tobacco abuse.  Has not drunk a beer in over 6  months but drinks very occasionally.  No illicit drug use.  He is a  veteran of the [place] War.  He did have Agent Orange exposure.   FAMILY HISTORY:  Significant for prostate cancer in his father,  myocardial infarction in his mother, and no history of venous thrombus  embolism in his family.   REVIEW OF SYSTEMS:  Other than noted above, a 10-point review of systems  is unremarkable.   PHYSICAL EXAMINATION:  An extremely pleasant elderly American gentleman  reclining in the stretcher in no acute distress.  VITALS:  Temperature 98.1, pulse 84, respirations 18, blood pressure  142/90.  He is saturating at 99% on room air.  Pupils are round and equal.  Mucous membranes are pink and anicteric.  No cervical lymphadenopathy.  No thyromegaly.  CHEST:  Clear to auscultation bilaterally.  CARDIOVASCULAR:  Regular rhythm.  No murmur heard.  ABDOMEN:  Distended by fat.  Soft and nontender.  He has a large pannus.  EXTREMITIES:  His lower extremities are thin and irregular in deformity  of the legs due to the shrapnel injury.  His pulses are equal.  The left  upper extremity is somewhat more swollen than the right but is not  grossly swollen.  CENTRAL NERVOUS SYSTEM:  Cranial nerves II-XII are grossly intact.  He  has no lateralizing signs.   LABS:  CBC is unremarkable.  White count is 7.1.  His hemoglobin is  11.4.  His platelets are 157.  His serum chemistry is significant for a  potassium of 5.7 and a creatinine of 1.97 with a BUN of 49.  His  creatinine in November was 1.2.  His PT is 13.6, INR 1, PTT 22.  Significantly, his D-dimer is less than 0.22, which is undetectable.   ASSESSMENT/PLAN:  1. Acute pain and swelling in the left upper extremity,  likely deep      venous thrombosis.  Surprisingly, his D-dimer is negative.  In any      event, we will agree with the emergency room physician and      anticoagulate him until Doppler studies available.  2. He appears to be dehydrated/acute renal failure with hypokalemia.      We will hold his lisinopril, spirolactone and Lasix for the time      being and hydrate him, giving him 1 liter of fluids slowly.  He      will probably need a cardiologist to readjust his medications.  He      does have an anemia which appears to be chronic.  3. In view of his renal insufficiency, we will check his digoxin      level.  4. History of prostate cancer surgery with persistent low PSA levels;      will consider lifelong anticogulation if DVT confirmed.  Other      plans are per orders.      Vania Rea, M.D.  Electronically Signed     LC/MEDQ  D:  06/19/2008  T:  06/19/2008  Job:  [id number]   cc:   Hillis Range, MD  Jesse Sans. Daleen Squibb, MD, [facility]  Heloise Purpura, MD

## 2010-11-01 NOTE — Assessment & Plan Note (Signed)
[facility]                            CARDIOLOGY OFFICE NOTE   JAESHAUN, RIVA                         MRN:          [id number]  DATE:02/20/2007                            DOB:          [birth year]    Mr. Doyon returns today for further management of his nonischemic  cardiomyopathy and chronic systolic heart failure.  Since our changes on  January 25, 2007, his dyspnea on exertion and energy level is improved  about 30'%.  We changed his HCTZ to furosemide 20 mg a day, added  digoxin 0.125 mg a day, and increased his lisinopril to 20 b.i.d.  He  remains on Coreg 25 b.i.d.   We obtained a Myoview which showed fixed defects and the inferior,  lateral. Distal, anterior, and apical walls.  There was significant  decrease in motion in these areas.  He has an EF of 28%.  There was no  evidence of ischemia.  These findings have been seen in the past and  seemed stable.   He denies any orthopnea, PND, or peripheral edema.   PHYSICAL EXAMINATION:  His blood pressure today is 128/72, his pulse 60  and regular, his weight is 212 which is stable.  He is in no acute distress.  NECK:  Shows no JVD.  Carotids are full.  LUNGS:  Clear to auscultation.  HEART:  Reveals no S3 gallop.  ABDOMEN:  Abdominal exam is soft.  EXTREMITIES:  Reveal no edema.  Pulses are intact.   LABORATORY DATA:  Blood work last visit showed creatinine of 1.4,  potassium of 4.1, normal LFTs except an elevated total bilirubin of 1.9,  and his cholesterol profile was remarkably good.  He also had a BNP that  was elevated at 344, TSH of 1.65.   I think Mr. Wingrove has shown good response to our initial changes.  I have  made no further changes today.  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  I really admire him.   We will have him return in four weeks.  If his chem-7 is stable at that  time, we may consider adding low dose Spironolactone  at 12.5-25 mg a  day.     Thomas C. Daleen Squibb, MD, [facility]  Electronically Signed    TCW/MedQ  DD: 02/20/2007  DT: 02/20/2007  Job #: [id number]   cc:   Quita Skye. Artis Flock, M.D.

## 2010-11-01 NOTE — Discharge Summary (Signed)
[name]                  ACCOUNT NO.:  [id number]   MEDICAL RECORD NO.:  [id number]          PATIENT TYPE:  INP   LOCATION:  2013                         FACILITY:  [facility]   PHYSICIAN:  Duke Salvia, MD, FACCDATE OF BIRTH:  1941/04/29   DATE OF ADMISSION:  06/18/2008  DATE OF DISCHARGE:  06/22/2008                               DISCHARGE SUMMARY   FINAL DIAGNOSES:  1. Left upper extremity deep vein thrombosis.  2. Venogram, June 22, 2008, showing a occlusion of the left      subclavian vein with good flow by way of collaterals.  The patient      has been introduced to Coumadin therapy and will be on Coumadin for      3 months.  The patient discharges on Lovenox 150 mg subcutaneous      injection daily.  3. There is question on chest x-ray of distal migration of the      cardioverted defibrillator lead, which was placed in October 2009.      There is a possible interference with the tricuspid valve and there      is risk for increased tricuspid regurgitation.  The recommendation      is for an outpatient 2-D echocardiogram to assess the valve      function.   SECONDARY DIAGNOSES:  1. Nonischemic cardiomyopathy, ejection fraction 25%.  2. New York Heart Association classification II chronic systolic      congestive heart failure.  3. Cardioverted defibrillator implanted, October 2009.  4. History of prostate cancer.  5. Hypertension.  6. Dyslipidemia.  7. Obesity.   Coumadin is started in this patient at this admission.   DISCHARGE MEDICATIONS:  He discharged on the following medications.  1. Enteric-coated aspirin 81 mg daily.  2. Coreg 25 mg twice daily.  3. Simvastatin 10 mg daily at bedtime.  4. Lasix 20 mg daily.  5. Digoxin 0.125 mg daily.  6. Lisinopril 20 mg twice daily.  7. Spironolactone 25 mg daily.  8. Lovenox 150 mg daily subcutaneous injection.  9. Coumadin 5 mg daily as directed by the Coumadin Clinic.   FOLLOWUP:  He is to follow up at  [facility], [address] [place].  1. Coumadin Clinic, Thursday June 25, 2008, at 9:30.  2. Echocardiogram, Monday, June 29, 2008 at 1.  3. To see Dr. Johney [name], Friday July 03, 2008 at 9:45 to discuss      results of echocardiogram.  4. To see Dr. Johney [name], July 28, 2008, at 9:10.  This is a 79-month      followup from his ICD implantation.  Once again 3 months of      Coumadin therapy.   LABORATORY DATA:  Laboratory studies on June 22, 2008, hemoglobin 10,  hematocrit 31, white cells 6.4, platelets are 135.  Sodium 143,  potassium 4.4, chloride 114, bicarbonate 23, BUN is 23, creatinine is  1.49, and glucose 97, pro-time 19.9, INR is 1.6.      Maple Mirza, [place]      Duke Salvia, MD, [facility]  Electronically Signed  GM/MEDQ  D:  06/22/2008  T:  06/23/2008  Job:  [id number]

## 2010-11-01 NOTE — Assessment & Plan Note (Signed)
[facility]                            CARDIOLOGY OFFICE NOTE   [name], [name]                         MRN:          [id number]  DATE:03/19/2008                            DOB:          05-Nov-1940    [name] returns today for further management of the following issues:  1. Nonischemic cardiomyopathy.  2. Class II chronic systolic congestive heart failure.  3. Obesity.   He is doing remarkably well.  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   Unfortunately, he has not lost any weight.  He still is at 218.  He  denies any lower extremity edema, orthopnea, PND, presyncope, syncope,  tachy palpitations, or chest pain.   His meds are unchanged since his last visit.  Please refer to the  maintenance medication list.   PHYSICAL EXAMINATION:  VITAL SIGNS:  His blood pressure today is 102/66,  his is pulse 64 and regular, weigh is 218.  HEENT:  Normal.  NECK:  Supple.  Carotid upstrokes were equal bilaterally without bruits.  No obvious JVD.  Thyroid is not enlarged.  Trachea is midline.  LUNGS:  Clear to auscultation and percussion.  HEART:  Nondisplaced PMI.  Normal S1 and S2.  No gallop.  ABDOMEN:  Protuberant, good bowel sounds.  No midline bruit.  EXTREMITIES:  No cyanosis, clubbing, or edema.  Pulses are present.  NEURO:  Intact.   [name] is doing well.  We will make no changes in his medical program  today.  I have encouraged him to try to lose about 10 pounds.  In  addition, we will obtain a 2-D echocardiogram to follow up on his left  ventricular function and severe mitral regurgitation.     Thomas C. Daleen Squibb, MD, [facility]  Electronically Signed    TCW/MedQ  DD: 03/19/2008  DT: 03/19/2008  Job #: [id number]

## 2010-11-01 NOTE — Op Note (Signed)
[name], [name]                  ACCOUNT NO.:  [id number]   MEDICAL RECORD NO.:  [id number]          PATIENT TYPE:  INP   LOCATION:  2013                         FACILITY:  [facility]   PHYSICIAN:  Duke Salvia, MD, FACCDATE OF BIRTH:  Aug 28, 1940   DATE OF PROCEDURE:  06/22/2008  DATE OF DISCHARGE:  06/22/2008                               OPERATIVE REPORT   PREOPERATIVE DIAGNOSIS:  Acutely swollen left arm.   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   PROCEDURE:  Contrast venogram.   The patient was brought to the Electrophysiology Laboratory.  A 20 mL of  contrast was injected with a 30 mL flush which demonstrated that the  left subclavian vein received blood up to the axillary portion.  There  was then collateralization that coursed across the subclavian venous  occlusion which was appeared to be total and then reconstituted flow in  the innominate vein.   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   RECOMMENDATIONS:  Coumadin x 3 months.      Duke Salvia, MD, [facility]  Electronically Signed     SCK/MEDQ  D:  06/22/2008  T:  06/23/2008  Job:  [id number]

## 2010-11-01 NOTE — Discharge Summary (Signed)
[name], [name]                  ACCOUNT NO.:  [id number]   MEDICAL RECORD NO.:  [id number]          PATIENT TYPE:  INP   LOCATION:  3731                         FACILITY:  [facility]   PHYSICIAN:  Hillis Range, MD       DATE OF BIRTH:  1940/09/26   DATE OF ADMISSION:  04/21/2008  DATE OF DISCHARGE:  04/22/2008                               DISCHARGE SUMMARY   ALLERGIES:  This patient has no known drug allergies.   FINAL DIAGNOSES:  Discharging day 1, status post implant of a St. Jude  current VR RF single chamber cardioverter defibrillator with  defibrillator threshold study was less than or equal to 15 joules, Dr.  Hillis Range.   SECONDARY DIAGNOSES:  1. Nonischemic cardiomyopathy with no improvement in ejection fraction      since 1996 on maximum medical therapy.  2. Ejection fraction, left heart catheterization in 1996, 25-30%.  3. Echocardiogram, March 30, 2008, ejection fraction of 25-30%.  4. New York Heart Association class II chronic systolic congestive      heart failure.  5. Hypertension.  6. Obesity.  7. Dyslipidemia.  8. History of prostate cancer.  9. Bilateral lower extremity wounds from shrapnel in [place].   PROCEDURE:  On April 21, 2008, implant of the St. Jude current single  chamber cardioverter defibrillator, Dr. Hillis Range.  The patient had  no postprocedural complications.  The device was interrogated.  All  values are within normal limits.  The x-ray was examined, the lead is in  appropriate position.  There is no pneumothorax.  Instructions of  mobility and incision care been described to the patient.   BRIEF HISTORY:  [name] is a 70 year old male.  He has history of  nonischemic cardiomyopathy dating back to a catheterization in 1996.  At  that time, ejection fraction was 25-30%.  A recent echocardiogram shows  no improvement in his left ventricular function and the 2-D  echocardiogram was done, March 30, 2008, ejection fraction 25-30%.  He  does note dyspnea and fatigue with moderate activities.   [name] presents for further risk stratification and sudden cardiac  death.  He meets criteria for implantable cardioverter-defibrillator.  The risks and benefits have been described to the patient, he wishes to  proceed.   HOSPITAL COURSE:  The patient presents electively, April 21, 2008.  He  underwent implantation of the single chamber cardioverter-defibrillator,  same day as appropriate for discharge postprocedure day #1.   MEDICATIONS AT DISCHARGE:  1. Coreg 25 mg twice daily.  2. Enteric-coated aspirin 81 mg daily.  3. Simvastatin 20 mg one-half tablet daily at bedtime.  4. Furosemide 20 mg daily.  5. Digoxin 0.125 mg daily.  6. Lisinopril 20 mg twice daily.  7. Spironolactone 25 mg daily.   He follows up at [facility] on [address], [address] [facility]  Clinic on Monday, May 11, 2008 at 9 o'clock.  He sees Dr. Johney Frame,  July 28, 2008, at 9:10 a.m.   LABORATORY STUDIES PERTINENT TO THIS ADMISSION:  Drawn on April 15, 2008;  white cells of 5.1, hemoglobin 12.1, hematocrit 36.4, and  platelets of 183.  Protime 11.9, INR 1.0, sodium 142, potassium 4.4,  chloride 110, carbonate 27, glucose 120, BUN is 29, and creatinine 1.2.      Maple Mirza, [place]      Hillis Range, MD  Electronically Signed    GM/MEDQ  D:  04/22/2008  T:  04/23/2008  Job:  [id number]   cc:   Quita Skye. Artis Flock, M.D.  Thomas C. Daleen Squibb, MD, [facility]  Hillis Range, MD

## 2010-11-01 NOTE — Assessment & Plan Note (Signed)
[facility]                            CARDIOLOGY OFFICE NOTE   TWAN, HARKIN                         MRN:          [id number]  DATE:05/29/2007                            DOB:          10/14/1940    [name] returns today for further management of the following issues:  1. Nonischemic cardiomyopathy.  2. Chronic left ventricular systolic failure.   He continues to feel better and better.  He is retiring at the end of  this month, which he is looking forward to.  He has dropped an  additional 2 pounds.  He denies orthopnea, PND, and has had very little  edema.   His medicines are unchanged.  His last chem. 7 was stable.  We are going  to add spironolactone 25 mg a day with followup chemistries.   EXAMINATION:  He is in no acute distress.  His blood pressure is 122/76.  Pulse 86 and regular.  His weight is 208,  down 2.  HEENT:  No change.  Carotid upstrokes are equal bilaterally without bruits.  No JVD.  Thyroid is not enlarged.  Trachea is midline.  LUNGS:  Clear.  HEART:  Reveals a nondisplaced PMI.  There is no S3 gallop.  ABDOMINAL EXAM:  Soft.  EXTREMITIES:  Reveal no edema.  Pulses are intact.  NEUROLOGIC EXAM:  Intact.   ASSESSMENT AND PLAN:  Mr. Rommel is doing well.  We added spironolactone  25 mg a day.  We will follow up a chem. 7 in about a week to 10 days.  I  will see him back again in 3 months.     Thomas C. Daleen Squibb, MD, [facility]  Electronically Signed    TCW/MedQ  DD: 05/29/2007  DT: 05/29/2007  Job #: [id number]   cc:   Quita Skye. Artis Flock, M.D.

## 2010-11-01 NOTE — Op Note (Signed)
[name], [name]                  ACCOUNT NO.:  [id number]   MEDICAL RECORD NO.:  [id number]          PATIENT TYPE:  INP   LOCATION:  1444                         FACILITY:  [facility]   PHYSICIAN:  Heloise Purpura, MD      DATE OF BIRTH:  04/21/1941   DATE OF PROCEDURE:  12/03/2006  DATE OF DISCHARGE:                               OPERATIVE REPORT   PREOPERATIVE DIAGNOSIS:  Clinically localized adenocarcinoma of the  prostate.   POSTOPERATIVE DIAGNOSIS:  Clinically localized adenocarcinoma of the  prostate.   PROCEDURE:  Robotic-assisted laparoscopic radical prostatectomy (right  nerve sparing).   SURGEON:  Heloise Purpura, MD   ASSISTANT:  Terie Purser, MD   ANESTHESIA:  General.   COMPLICATIONS:  None.   ESTIMATED BLOOD LOSS:  100 mL.   INTRAVENOUS FLUIDS:  1400 mL of lactated Ringer's.   SPECIMENS:  1. Prostate.  2. Seminal vesicles.   DISPOSITION OF SPECIMEN:  To pathology.   INDICATIONS:  [name] is a 70 year old general with clinical stage T2A  prostate cancer with a PSA of 5.1 and Gleason score 3 +3 equals 6.  Pretreatment IIEF score was 4 with an AUA symptom score of 1.  After  discussing management options for clinically localized prostate cancer,  the patient elected to undergo the above procedure.  Potential risks,  complications, and alternative options were discussed with the patient;  and informed consent was obtained.   DESCRIPTION OF PROCEDURE:  The patient was taken to the operating room  and a general anesthetic was administered.  He was given preoperative  antibiotics, placed in the dorsal lithotomy position; and prepped and  draped in the usual sterile fashion.  Next a preoperative time-out was  performed.   A Foley catheter was inserted into the bladder.  The patient was noted  to have an upper midline incision from a previous exploratory  laparotomy.  A camera port incision was then made using an open Hassan  technique.  The rectus fascia was  divided and the peritoneum was  identified.  However, there was concern of intra-abdominal adhesions.  Therefore, a Veress needle was placed into the suprapubic region and the  abdomen was insufflated.  On reinspection of the original [facility] port  site, the intraperitoneal cavity was able to be identified and a 12 mm  port was then placed.  A 0-degree lens was used to inspect the abdomen;  and there were noted to be some left-sided adhesions between the bowel  and abdominal wall.  However, the right side was free of adhesions.  An  8-mm port was placed in the right lateral abdominal wall approximately  10 cm lateral to the camera port.  A 5-mm port was placed between the  camera port and the right robotic port.  An additional 12-mm port was  placed in the far right lateral abdominal wall for laparoscopic  assistance.   The patient's previously mentioned adhesions in the left side of the  abdomen were then carefully taken down with laparoscopic scissors.  An 8-  mm port was placed 10-cm lateral to  the camera port on this side; and  then an additional 8-mm port was placed in the far left lateral  abdominal wall for the fourth arm.  Inspection of the superior abdominal  cavity revealed adhesions superiorly in the midline.  However, the  camera port was far away from any adhesions; and it was felt that the  initial concern for adhesions was likely due to the patient's very thick  abdominal wall.  The surgical cart was then docked.  With the aid of  cautery scissors, the bladder was reflected posteriorly allowing entry  into the space of Retzius and identification of the endopelvic fascia  and prostate.  The endopelvic fascia was incised from the apex back to  the base of the prostate bilaterally; and the underlying levator muscle  fibers were swept off the prostate.  This isolated the dorsal venous  complex which was then stapled and divided with a 45 mm flex-ETS  stapler.   The bladder  neck was divided anteriorly thereby exposing the Foley  catheter.  The catheter balloon was deflated; and the catheter was  brought into the operative field; and used to retract the prostate  anteriorly.  The posterior bladder neck was then divided and dissection  continued posteriorly between the prostate and bladder; until the vasa  deferentia and seminal vesicles were identified.  The vasa deferentia  were isolated, divided, and lifted anteriorly.  The seminal vesicles  were then dissected down to their tips with care to control the seminal  vesicle arterial blood supply.   The seminal vesicles lifted anteriorly and the space between  Denonvilliers fascia and the anterior rectum was bluntly developed  thereby isolating the vascular pedicles of the prostate.  On the right  side, the lateral prostatic fascia was incised, allowing the  neurovascular bundle to be swept laterally and posteriorly off the  prostate.  The vascular pedicle of the prostate was then ligated with  Hem-o-lok clips and divided with sharp cold scissor dissection above the  levels of the neurovascular bundles.  The neurovascular bundles were  then swept off the apex of the prostate and urethra.  On the left side,  the vascular pedicle of prostate was, again, ligated with Hem-o-lok  clips in a wide-nonnerve sparing fashion.  The urethra was then sharply  divided allowing the prostate specimen to be disarticulated.  The pelvis  was copiously irrigated; and hemostasis was ensured.   Attention then turned to the urethral anastomosis.  A 2-0 Vicryl slip-  knot was placed between  Denonvilliers fascia, the bladder neck, and  posterior urethra to reapproximate these structures.  A double-armed 3-0  Monocryl suture was then used to perform a 360-degree running tension-  free anastomosis between the bladder neck and urethra.  A new 20-French  Coude catheter was inserted into the bladder and irrigated.  There no blood clots  within the bladder; and the anastomosis appeared to be  watertight.  A #19 Blake drain was then brought through the left robotic  port and appropriately positioned in the pelvis.  It was secured to the  skin with a nylon suture.  The surgical cart was undocked.   Attention turned the right lateral 12-mm port site which was closed with  a #0 Vicryl suture placed with the aid of a suture passer device.  The  prostate specimen was then removed intact within the Endopouch retrieval  bag via the periumbilical port site; which was closed with a running #0  Vicryl fascial  suture.  All remaining ports were removed under direct  vision; and the pneumoperitoneum was let down.  Then 1/4% Marcaine was  injected into all incision  sites which were reapproximated at the skin level with staples.  Sterile  dressings were applied.  The patient appeared to tolerate the procedure  well and without complications.  He was able to be extubated, and  transferred to the recovery unit in satisfactory condition.           ______________________________  Heloise Purpura, MD  Electronically Signed     LB/MEDQ  D:  12/03/2006  T:  12/03/2006  Job:  [id number]

## 2010-11-01 NOTE — Assessment & Plan Note (Signed)
[facility]                            CARDIOLOGY OFFICE NOTE   [name], [name]                         MRN:          [id number]  DATE:01/25/2007                            DOB:          08/14/1940    [name] returns today for further management of presuming a  nonischemic cardiomyopathy and chronic systolic heart failure.   Please see my note from July [name], 2008.   A 2D echo actually showed no significant decline in global left  ventricular systolic function.  His EF is still 25%.  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  Gary also has developed a mildly [name] right ventricle,  markedly increased pulmonary artery systolic pressures consistent with  pulmonary hypertension, and moderately [name] right atrium.  I suspect  that Gary has slowly developed progressive MR and now is having  biventricular failure symptoms with pulmonary hypertension.   I explained this to him today with a heart model   For starters, we will make some changes in his medications.  I am going  to change his HCTZ to Furosemide 20 mg p.o. q.a.m., add digoxin 0.125 mg  a day, reinforce taking aspirin every day, and increase his Lisinopril  to 20 mg p.o. b.i.d. from 20 mg a day.  Gary will remain on Coreg 25  b.i.d.   We will arrange for him to have a Chem-7, a BNP, and a TSH when Gary  returns for an adenosine Myoview.  Gary had a negative Myoview or  Cardiolite in the past.   I will then see him back in the office for close followup.  It may take  several months to titrate and adjust his medications.  If his renal  function is normal, we will probably add spironolactone at his next  visit.     Thomas C. Daleen Squibb, MD, [facility]  Electronically Signed    TCW/MedQ  DD: 01/25/2007  DT: 01/25/2007  Job #: [id number]   cc:   Quita Skye. Artis Flock, M.D.

## 2010-11-01 NOTE — Discharge Summary (Signed)
NAMEJOSHOA, SHAWLER                  ACCOUNT NO.:  [id number]   MEDICAL RECORD NO.:  [id number]          PATIENT TYPE:  INP   LOCATION:  2013                         FACILITY:  [facility]   PHYSICIAN:  Lucita Ferrara, MD         DATE OF BIRTH:  Sep 20, 1940   DATE OF ADMISSION:  06/19/2008  DATE OF DISCHARGE:  06/22/2008                               DISCHARGE SUMMARY   DISCHARGE DIAGNOSES:  1. Left upper extremity deep vein thrombosis.  2. Renal insufficiency.  3. Nonischemic cardiomyopathy.  4. New York Heart Association classification II chronic systolic heart      failure.  5. History of prostate cancer.  6. Hypertension.  7. Dyslipidemia.   CONSULTANTS:  Duke Salvia, MD, [facility] from [facility] EP.   PROCEDURES:  1. The patient had a digital chest x-ray on June 19, 2008 which      showed AICD in a different configuration than on previous study  2. The patient had another chest x-ray on June 20, 2008 which showed      similar appearance of the AICD.  There was borderline cardiomegaly.  3. The patient had a venogram which was done on June 19, 2008 which      showed left upper extremity venous duplex consistent with deep vein      thrombosis.   BRIEF HISTORY OF PRESENT ILLNESS:  Mr. Kaegan Hettich is a 70 year old male  who presented with pain and swelling in the left upper extremity since 1  day prior to admission.  He recently had had an ICD implanted on  April 21, 2008 and had been doing well since that time until he  experienced left upper extremity tightness and swelling.  It was  evaluated by the emergency room physician and diagnosed with deep vein  thrombosis clinically, although there was no Doppler at that time.  He  also appeared to be somewhat dehydrated.  He was admitted to the medical  telemetry floor and cardiology was consulted, given his recent AICD  placement.  He remained hemodynamically stable.  The patient was  empirically started on heparin and  Coumadin.  The patient was unable to  receive IV contrast secondary to his renal function.   HOSPITAL COURSE:  Left upper extremity edema.  There was no evidence of  deep vein thrombosis per ultrasonography and Dopplers.  Full  anticoagulation was discontinued.  It was thought thus to be secondary  to AICD placement.  Cardiology was consulted.  The patient finally  underwent a venogram which was completed June 22, 2008.  The venogram  was consistent with occlusion of the left Emmet.  Recommendations per  cardiology to continue with low molecular weight heparin and Coumadin x3  months.  Pharmacy was consulted with health management of his INRs.  The  patient remained hemodynamically stable.   DISCHARGE MEDICATIONS:  1. Aspirin 81 mg p.o. daily.  2. Coreg 25 mg p.o. b.i.d.  3. Digoxin 0.125 mg p.o. daily.  4. Lovenox to be given 1 mg/kg b.i.d.  5. Coumadin 5 mg p.o. daily.  Note - the patient needs to check INRs      every 3 days per Coumadin clinic follow up and per coags.  The      patient follow advised follow up with Dr. Graciela Husbands.  Goal INR between      2 and 3.  Currently, there is no active contraindication to      Coumadin therapy, and per cardiology the benefits of Coumadin      therapy outweigh the risks.   DISCHARGE PHYSICAL EXAMINATION:  GENERAL:  The patient is in no acute  distress.  VITAL SIGNS:  Blood pressure is 104/66, temperature 98.1, pulse 74,  respirations 95% on room air.  HEENT:  Normocephalic, atraumatic.  Sclerae anicteric.  NECK:  Supple.  No JVD, no carotid bruits.  Pupils equal, round and  reactive to light and accommodation.  Extraocular muscles intact.  CARDIOVASCULAR:  S1 and S2.  Regular rate and rhythm.  No murmurs, rubs,  or gallops.  LUNGS:  Clear to auscultation bilaterally.  No rhonchi, rales or  wheezes.  ABDOMEN:  Soft, nontender, nondistended with positive bowel sounds.   LABORATORY DATA:  Basic metabolic panel shows a BUN of 23, creatinine   1.49, INR is 1.6.  CBC normal.  Hemoglobin 10, hematocrit 31.  BMET  within normal limits.      Lucita Ferrara, MD  Electronically Signed     RR/MEDQ  D:  06/22/2008  T:  06/22/2008  Job:  [id number]

## 2010-11-01 NOTE — Consult Note (Signed)
NAMEDONNA, [name]                  ACCOUNT NO.:  [id number]   MEDICAL RECORD NO.:  [id number]          PATIENT TYPE:  INP   LOCATION:  2013                         FACILITY:  [facility]   PHYSICIAN:  Duke Salvia, MD, FACCDATE OF BIRTH:  1940/12/07   DATE OF CONSULTATION:  06/20/2008  DATE OF DISCHARGE:                                 CONSULTATION   We were asked [place] to see [name] in consultation because of a  swollen left arm following ICD implantation 2 months ago.   [name] is a 70 year old African American gentleman with a history of  nonischemic cardiomyopathy, ejection fraction of 25%, class II  congestive heart failure, and a narrow QRS 1:1 ICD implantation in  November which was uncomplicated.   He presented to the hospital on New Year's Eve with apparently acute  pain and swelling of his left upper extremity.  On history today, he  says it is much better.   His past medical history is notable for the above, in addition it  includes:  1. Prostate cancer status post robotic prostatectomy.  2. Dyslipidemia.  3. Obesity.  4. Lower extremity wounds from shrapnel as well as shrapnel presence      in his left upper arm.   MEDICATIONS:  1. Coreg 25.  2. Aspirin 81.  3. Simvastatin 10.  4. Lasix 20.  5. Digoxin 0.125.  6. Lisinopril 20.  7. Spironolactone and he is currently in hospital.  8. Also on Coumadin.  9. Subcutaneous heparin.   ALLERGIES:  He is allergic to TAPE.   SOCIAL HISTORY:  Drinks occasionally.  Is retired from the post office.   His review of systems is noncontributory.   On examination, he is a middle older African American male appearing his  stated age of 26.  His blood pressure was 109/65, his pulse was 74.  His  HEENT exam demonstrated no icterus or xanthoma.  His neck veins were  difficult to discern, but I did not see them distended on either the  right or the left neck.  There was some swelling of the left upper arm.  The left  forearm and the left hand are of anything less swollen than the  right, although the hand has been on pillows.  His lungs were clear.  His heart sounds were regular with device pocket was well healed.  The  abdomen was soft.  The extremities had no edema.  Neurological exam was  grossly normal.   Laboratories were notable for a D-dimer that was normal with a potassium  of 5.7 on admission with BUN of 49, and creatinine of 1.97, which is now  returned towards normal with 26 and 1.37.   Chest x-ray demonstrated distal migration of the proximal coil into the  innominate SVC junction from a more horizontal location.  This has been  associated with a redundancy at the tricuspid valve apparatus.   Venous Dopplers are reportedly normal, although I do not see those  results in the chart.   IMPRESSION:  1. Left upper extremity swelling relatively acute  with apparently      negative venous Dopplers.  2. Distal migration of the implantable cardioverter-defibrillator      lead.  3. Nonischemic cardiomyopathy with class II congestive heart failure.  4. Status post implantable cardioverter-defibrillator for primary      prevention.  5. Hypertension.  6. History of prostate cancer.  7. Shrapnel injury to lower extremities and left upper extremity.   [name] has left upper extremity swelling, which is now much improved  compared to admission per the patient's history.  It has been elevated  and this may explain that.  Swelling at the site of device implantation  strongly suggests a stenosis and/or deep vein thrombosis in the upper  extremity.  I do not know about the sensitivity of D-dimer testing in  this cohort.  Experientially, venous Dopplers are inadequately sensitive  for identifying thrombosis partly because of the anatomy with the  overlying clavicle.   Given the issues of infection risk for Coumadin and the recommendations  that Coumadin are appropriate for deep vein thrombosis in  the pacemaker  site.  Venography I think is indicated.  We will plan to perform this on  Monday.  In the event that the venogram is negative, Coumadin will be  discontinued and aspirin maintained.   The other issue is distal migration of the defibrillator lead.  It has  developed redundancy at the tricuspid valve apparatus.  I do not know  whether there may be interfering mechanical with this.  His echo prior  to implantation was notable for minimal tricuspid regurgitation.  In the  event that is more significant tricuspid regurgitation is noted now  while it may not be related to the redundancy of the lead and similarly  the lead itself.  I think at that point it would be worth considering  opening the pocket with drawing the lead about an inch or two and then  re-securing the lead.  That can be done more electively.   RECOMMENDATIONS:  Based on the above, we will therefore,  1. Continue his heparin and Coumadin.  2. Undertake venography on Monday.  3. Outpatient echo to look at the issue of tricuspid valve apparatus      interference from the redundant lead.   Thank you for the consultation.      Duke Salvia, MD, [facility]  Electronically Signed     SCK/MEDQ  D:  06/20/2008  T:  06/20/2008  Job:  [id number]   cc:   [name], M.D.  Gary C. Wall, MD, [facility]

## 2010-11-23 ENCOUNTER — Encounter: Payer: Self-pay | Admitting: Internal Medicine

## 2010-11-24 ENCOUNTER — Encounter: Payer: Self-pay | Admitting: Internal Medicine

## 2010-11-24 ENCOUNTER — Other Ambulatory Visit (INDEPENDENT_AMBULATORY_CARE_PROVIDER_SITE_OTHER): Payer: Medicare Other

## 2010-11-24 ENCOUNTER — Ambulatory Visit (INDEPENDENT_AMBULATORY_CARE_PROVIDER_SITE_OTHER): Payer: Medicare Other | Admitting: Internal Medicine

## 2010-11-24 DIAGNOSIS — E119 Type 2 diabetes mellitus without complications: Secondary | ICD-10-CM

## 2010-11-24 DIAGNOSIS — I10 Essential (primary) hypertension: Secondary | ICD-10-CM

## 2010-11-24 DIAGNOSIS — N182 Chronic kidney disease, stage 2 (mild): Secondary | ICD-10-CM

## 2010-11-24 DIAGNOSIS — E785 Hyperlipidemia, unspecified: Secondary | ICD-10-CM

## 2010-11-24 DIAGNOSIS — D508 Other iron deficiency anemias: Secondary | ICD-10-CM

## 2010-11-24 DIAGNOSIS — I509 Heart failure, unspecified: Secondary | ICD-10-CM

## 2010-11-24 LAB — URINALYSIS, ROUTINE W REFLEX MICROSCOPIC
Bilirubin Urine: NEGATIVE
Ketones, ur: NEGATIVE
Leukocytes, UA: NEGATIVE
Urine Glucose: NEGATIVE
Urobilinogen, UA: 0.2 (ref 0.0–1.0)

## 2010-11-24 LAB — IBC PANEL
Iron: 46 ug/dL (ref 42–165)
Saturation Ratios: 12.3 % — ABNORMAL LOW (ref 20.0–50.0)
Transferrin: 266.1 mg/dL (ref 212.0–360.0)

## 2010-11-24 LAB — COMPREHENSIVE METABOLIC PANEL
ALT: 29 U/L (ref 0–53)
Albumin: 4.2 g/dL (ref 3.5–5.2)
CO2: 29 mEq/L (ref 19–32)
Calcium: 8.7 mg/dL (ref 8.4–10.5)
Chloride: 104 mEq/L (ref 96–112)
GFR: 80.24 mL/min (ref >60.00)
Potassium: 3.8 mEq/L (ref 3.5–5.1)
Sodium: 142 mEq/L (ref 135–145)
Total Protein: 7.4 g/dL (ref 6.0–8.3)

## 2010-11-24 LAB — HEMOGLOBIN A1C: Hgb A1c MFr Bld: 6.7 % — ABNORMAL HIGH (ref 4.6–6.5)

## 2010-11-24 LAB — CBC WITH DIFFERENTIAL/PLATELET
Eosinophils Relative: 2.5 % (ref 0.0–5.0)
HCT: 40.9 % (ref 39.0–52.0)
Lymphocytes Relative: 27.2 % (ref 12.0–46.0)
Lymphs Abs: 1.4 10*3/uL (ref 0.7–4.0)
Monocytes Relative: 12 % (ref 3.0–12.0)
Neutrophils Relative %: 57.8 % (ref 43.0–77.0)
Platelets: 180 10*3/uL (ref 150.0–400.0)
WBC: 5.3 10*3/uL (ref 4.5–10.5)

## 2010-11-24 NOTE — Assessment & Plan Note (Signed)
There is no evidence of fluid overload today

## 2010-11-24 NOTE — Assessment & Plan Note (Signed)
It sounds like his blood sugars are well controlled, today I will check his A1C level

## 2010-11-24 NOTE — Progress Notes (Signed)
Subjective:    Patient ID: [name], male    DOB: 1941/05/30, 70 y.o.   MRN: [id number]  Diabetes He presents for his follow-up diabetic visit. He has type 2 diabetes mellitus. His disease course has been improving. There are no hypoglycemic associated symptoms. Pertinent negatives for hypoglycemia include no dizziness, headaches, pallor, seizures, speech difficulty, sweats or tremors. There are no diabetic associated symptoms. Pertinent negatives for diabetes include no blurred vision, no chest pain, no fatigue and no weakness. There are no hypoglycemic complications. Symptoms are improving. Diabetic complications include heart disease. Current diabetic treatment includes oral agent (monotherapy). He is compliant with treatment all of the time. His weight is stable. He is following a generally healthy diet. Meal planning includes avoidance of concentrated sweets. He has not had a previous visit with a dietician. He participates in exercise intermittently. His home blood glucose trend is decreasing steadily. His breakfast blood glucose range is generally 90-110 mg/dl. His lunch blood glucose range is generally 90-110 mg/dl. His dinner blood glucose is taken between 5-6 pm. His dinner blood glucose range is generally 90-110 mg/dl. His highest blood glucose is 130-140 mg/dl. His overall blood glucose range is 90-110 mg/dl. An ACE inhibitor/angiotensin II receptor blocker is being taken. He does not see a podiatrist.Eye exam is current.  Hypertension This is a chronic problem. The current episode started more than 1 year ago. The problem has been gradually improving since onset. The problem is controlled. Pertinent negatives include no anxiety, blurred vision, chest pain, headaches, malaise/fatigue, neck pain, orthopnea, palpitations, peripheral edema, PND, shortness of breath or sweats. Past treatments include beta blockers, diuretics and ACE inhibitors. The current treatment provides significant  improvement. There are no compliance problems.  Hypertensive end-organ damage includes heart failure.      Review of Systems  Constitutional: Negative for fever, chills, malaise/fatigue, diaphoresis, activity change, appetite change, fatigue and unexpected weight change.  HENT: Negative for facial swelling and neck pain.   Eyes: Negative for blurred vision, photophobia and visual disturbance.  Respiratory: Negative for apnea, cough, choking, chest tightness, shortness of breath, wheezing and stridor.   Cardiovascular: Negative for chest pain, palpitations, orthopnea, leg swelling and PND.  Gastrointestinal: Negative for nausea, vomiting, abdominal pain, diarrhea, constipation and blood in stool.  Genitourinary: Negative for dysuria, urgency, frequency, hematuria, flank pain, decreased urine volume, enuresis and difficulty urinating.  Musculoskeletal: Negative for myalgias, back pain, joint swelling, arthralgias and gait problem.  Skin: Negative for color change, pallor and rash.  Neurological: Negative for dizziness, tremors, seizures, syncope, facial asymmetry, speech difficulty, weakness, light-headedness, numbness and headaches.  Hematological: Negative for adenopathy. Does not bruise/bleed easily.  Psychiatric/Behavioral: Negative.        Objective:   Physical Exam  [vitalsreviewed. Constitutional: He is oriented to person, place, and time. He appears well-developed and well-nourished. No distress.  HENT:  Head: Normocephalic and atraumatic.  Right Ear: External ear normal.  Left Ear: External ear normal.  Nose: Nose normal.  Mouth/Throat: Oropharynx is clear and moist. No oropharyngeal exudate.  Eyes: Conjunctivae and EOM are normal. Pupils are equal, round, and reactive to light. Right eye exhibits no discharge. Left eye exhibits no discharge. No scleral icterus.  Neck: Normal range of motion. Neck supple. No JVD present. No tracheal deviation present. No thyromegaly present.    Cardiovascular: Normal rate, regular rhythm, normal heart sounds and intact distal pulses.  Exam reveals no gallop and no friction rub.   No murmur heard. Pulmonary/Chest: Effort normal and  breath sounds normal. No stridor. No respiratory distress. He has no wheezes. He has no rales. He exhibits no tenderness.  Abdominal: Soft. Bowel sounds are normal. He exhibits no distension and no mass. There is no tenderness. There is no rebound and no guarding.  Musculoskeletal: Normal range of motion. He exhibits no edema and no tenderness.  Lymphadenopathy:    He has no cervical adenopathy.  Neurological: He is alert and oriented to person, place, and time. He has normal reflexes. He displays normal reflexes. No cranial nerve deficit. He exhibits normal muscle tone. Coordination normal.  Skin: Skin is warm and dry. No rash noted. He is not diaphoretic. No erythema. No pallor.  Psychiatric: He has a normal mood and affect. His behavior is normal. Judgment and thought content normal.        Lab Results  Component Value Date   WBC 5.8 07/27/2010   HGB 12.9* 07/27/2010   HCT 39.0 07/27/2010   PLT 171.0 07/27/2010   CHOL 151 02/08/2010   TRIG 74.0 02/08/2010   HDL 38.70* 02/08/2010   ALT 26 07/27/2010   AST 21 07/27/2010   NA 143 07/27/2010   K 4.4 07/27/2010   CL 109 07/27/2010   CREATININE 1.2 07/27/2010   BUN 27* 07/27/2010   CO2 29 07/27/2010   TSH 1.20 02/08/2010   PSA <0.01 ng/mL* 11/09/2008   INR 4.4 RATIO* 06/25/2008   HGBA1C 6.9* 07/27/2010   MICROALBUR 0.4 02/08/2010    Assessment & Plan:

## 2010-11-24 NOTE — Assessment & Plan Note (Signed)
He has no s/s of blood loss, today I will check his CBC and look at his iron level as well

## 2010-11-24 NOTE — Assessment & Plan Note (Signed)
His BP is well controlled, I will monitor his renal function and lytes today 

## 2010-11-24 NOTE — Assessment & Plan Note (Signed)
He has no s/s of renal failure. Today I will monitor his renal function.

## 2010-11-24 NOTE — Assessment & Plan Note (Signed)
He is doing well on his statin therapy

## 2010-11-24 NOTE — Patient Instructions (Signed)
Diabetes, Type 2 Diabetes is a lasting (chronic) disease. In type 2 diabetes, the pancreas does not make enough insulin (a hormone), and the body does not respond normally to the insulin that is made. This type of diabetes was also previously called adult onset diabetes. About 90% of all those who have diabetes have type 2. It usually occurs after the age of 40 but can occur at any age. CAUSES Unlike type 1 diabetes, which happens because insulin is no longer being made, type 2 diabetes happens because the body is making less insulin and has trouble using the insulin properly. SYMPTOMS  Drinking more than usual.   Urinating more than usual.   Blurred vision.   Dry, itchy skin.   Frequent infection like yeast infections in women.   More tired than usual (fatigue).  TREATMENT  Healthy eating.   Exercise.   Medication, if needed.   Monitoring blood glucose (sugar).   Seeing your caregiver regularly.  HOME CARE INSTRUCTIONS  Check your blood glucose (sugar) at least once daily. More frequent monitoring may be necessary, depending on your medications and on how well your diabetes is controlled. Your caregiver will advise you.   Take your medicine as directed by your caregiver.   Do not smoke.   Make wise food choices. Ask your caregiver for information. Weight loss can improve your diabetes.   Learn about low blood glucose (hypoglycemia) and how to treat it.   Get your eyes checked regularly.   Have a yearly physical exam. Have your blood pressure checked. Get your blood and urine tested.   Wear a pendant or bracelet saying that you have diabetes.   Check your feet every night for sores. Let your caregiver know if you have sores that are not healing.  SEEK MEDICAL CARE IF:  You are having problems keeping your blood glucose at target range.   You feel you might be having problems with your medicines.   You have symptoms of an illness that is not improving after 24  hours.   You have a sore or wound that is not healing.   You notice a change in vision or a new problem with your vision.   You develop a fever of more than 100.5.  Document Released: 06/05/2005 Document Re-Released: 06/27/2009 [employer] Patient Information 2011 [employer]. 

## 2010-11-25 ENCOUNTER — Encounter: Payer: Self-pay | Admitting: Internal Medicine

## 2010-12-15 ENCOUNTER — Ambulatory Visit (INDEPENDENT_AMBULATORY_CARE_PROVIDER_SITE_OTHER): Payer: Medicare Other | Admitting: *Deleted

## 2010-12-15 DIAGNOSIS — I472 Ventricular tachycardia: Secondary | ICD-10-CM

## 2010-12-15 DIAGNOSIS — I428 Other cardiomyopathies: Secondary | ICD-10-CM

## 2010-12-19 ENCOUNTER — Ambulatory Visit (INDEPENDENT_AMBULATORY_CARE_PROVIDER_SITE_OTHER): Payer: Medicare Other | Admitting: Internal Medicine

## 2010-12-19 ENCOUNTER — Other Ambulatory Visit: Payer: Self-pay | Admitting: Internal Medicine

## 2010-12-19 ENCOUNTER — Encounter: Payer: Self-pay | Admitting: Internal Medicine

## 2010-12-19 VITALS — BP 118/82 | HR 92 | Temp 97.8°F | Resp 16 | Wt 221.8 lb

## 2010-12-19 DIAGNOSIS — I509 Heart failure, unspecified: Secondary | ICD-10-CM

## 2010-12-19 DIAGNOSIS — M25569 Pain in unspecified knee: Secondary | ICD-10-CM

## 2010-12-19 DIAGNOSIS — M25562 Pain in left knee: Secondary | ICD-10-CM

## 2010-12-19 DIAGNOSIS — I10 Essential (primary) hypertension: Secondary | ICD-10-CM

## 2010-12-19 LAB — REMOTE ICD DEVICE
CHARGE TIME: 10.8 s
DEV-0020ICD: NEGATIVE
RV LEAD IMPEDENCE ICD: 350 Ohm

## 2010-12-19 MED ORDER — TRAMADOL HCL 50 MG PO TABS: 50.0000 mg | ORAL_TABLET | Freq: Four times a day (QID) | ORAL | Status: AC | PRN

## 2010-12-19 NOTE — Patient Instructions (Signed)
Take all new medications as prescribed Continue all other medications as before You will be contacted regarding the referral for: orthopedic

## 2010-12-20 NOTE — Progress Notes (Signed)
icd remote check  

## 2010-12-21 ENCOUNTER — Encounter: Payer: Self-pay | Admitting: Internal Medicine

## 2010-12-21 DIAGNOSIS — Z0000 Encounter for general adult medical examination without abnormal findings: Secondary | ICD-10-CM | POA: Insufficient documentation

## 2010-12-21 NOTE — Assessment & Plan Note (Signed)
Mild to mod pain, with increased effusion, worse with excericse, recurrent with tendency towards giveaway without fall;  Ok for tramadol prn, and refer ortho

## 2010-12-21 NOTE — Progress Notes (Signed)
Subjective:    Patient ID: [name], male    DOB: [birth year], 70 y.o.   MRN: [id number]  HPI  Here to c/o 4 wks onset left knee pain, recurrent mild to mod, with recurrent swelling now dialy, that seems better by the next am but keeps recurring, worse with more ambulation, no falls but has had some tendency towards giveaways.  No trauma, fever, or hx of gout. No prior left knee evaluation, MRI, PT or other tx. Pt denies chest pain, increased sob or doe, wheezing, orthopnea, PND, increased LE swelling, palpitations, dizziness or syncope.  Pt denies new neurological symptoms such as new headache, or facial or extremity weakness or numbness   Pt denies polydipsia, polyuria Past Medical History  Diagnosis Date  . Cardiac defibrillator in place   . Obesity   . Prostate cancer   . Glucose intolerance (impaired glucose tolerance)   . Bursitis of elbow     left  . Osteoarthritis   . HTN (hypertension)   . HLD (hyperlipidemia)   . History of DVT (deep vein thrombosis)   . CHF (congestive heart failure)   . Other primary cardiomyopathies    Past Surgical History  Procedure Date  . Transurethral resection of prostate   . Cardiac defibrillator placement 10/09  . Hernia repair   . Robot assisted laparoscopic radical prostatectomy 6/08    reports that he has quit smoking. He does not have any smokeless tobacco history on file. He reports that he drinks alcohol. He reports that he does not use illicit drugs. family history includes Cancer in his father and Heart disease in his mother. No Known Allergies Current Outpatient Prescriptions on File Prior to Visit  Medication Sig Dispense Refill  . aspirin 81 MG tablet Take 81 mg by mouth daily.        . carvedilol (COREG) 25 MG tablet Take 25 mg by mouth 2 (two) times daily with a meal.        . digoxin (LANOXIN) 0.125 MG tablet Take 125 mcg by mouth daily.        . Ferrous Sulfate Dried (FEOSOL) 200 (65 FE) MG TABS Take by mouth 2 (two) times  daily with a meal.        . furosemide (LASIX) 20 MG tablet Take 20 mg by mouth daily.        Marland Kitchen ibuprofen (ADVIL,MOTRIN) 800 MG tablet Take 800 mg by mouth every 8 (eight) hours as needed.        . Linagliptin (TRADJENTA) 5 MG TABS Take 5 mg by mouth daily.        Marland Kitchen lisinopril (PRINIVIL,ZESTRIL) 20 MG tablet Take 20 mg by mouth 2 (two) times daily.        . simvastatin (ZOCOR) 20 MG tablet Take 10 mg by mouth at bedtime.         Review of Systems Review of Systems  Constitutional: Negative for diaphoresis and unexpected weight change.  HENT: Negative for drooling and tinnitus.   Eyes: Negative for photophobia and visual disturbance.  Respiratory: Negative for choking and stridor.      Objective:   Physical Exam BP 118/82  Pulse 92  Temp(Src) 97.8 F (36.6 C) (Oral)  Resp 16  Wt 221 lb 12 oz (100.585 kg)  SpO2 96% Physical Exam  VS noted Constitutional: Pt appears well-developed and well-nourished.  HENT: Head: Normocephalic.  Right Ear: External ear normal.  Left Ear: External ear normal.  Eyes: Conjunctivae and EOM are  normal. Pupils are equal, round, and reactive to light.  Neck: Normal range of motion. Neck supple.  Cardiovascular: Normal rate and regular rhythm.   Pulmonary/Chest: Effort normal and breath sounds normal.  Abd:  Soft, NT, non-distended, + BS Neurological: Pt is alert. No cranial nerve deficit. gait ok, motor intact Skin: Skin is warm. No erythema.  Psychiatric: Pt behavior is normal. Thought content normal.  Left knee with 1-2+ effusion, decreased ROM, mild tender to patella and mild knee crepitus       Assessment & Plan:

## 2010-12-21 NOTE — Assessment & Plan Note (Signed)
stable overall by hx and exam, most recent data reviewed with pt, and pt to continue medical treatment as before  Lab Results  Component Value Date   WBC 5.3 11/24/2010   HGB 13.6 11/24/2010   HCT 40.9 11/24/2010   PLT 180.0 11/24/2010   CHOL 151 02/08/2010   TRIG 74.0 02/08/2010   HDL 38.70* 02/08/2010   ALT 29 11/24/2010   AST 25 11/24/2010   NA 142 11/24/2010   K 3.8 11/24/2010   CL 104 11/24/2010   CREATININE 1.2 11/24/2010   BUN 25* 11/24/2010   CO2 29 11/24/2010   TSH 1.20 02/08/2010   PSA <0.01 ng/mL* 11/09/2008   INR 4.4 RATIO* 06/25/2008   HGBA1C 6.7* 11/24/2010   MICROALBUR 0.4 02/08/2010

## 2010-12-21 NOTE — Assessment & Plan Note (Signed)
stable overall by hx and exam, most recent data reviewed with pt, and pt to continue medical treatment as before  BP Readings from Last 3 Encounters:  12/19/10 118/82  11/24/10 118/68  09/12/10 132/78

## 2010-12-23 ENCOUNTER — Encounter: Payer: Self-pay | Admitting: *Deleted

## 2011-01-03 ENCOUNTER — Other Ambulatory Visit: Payer: Self-pay | Admitting: Orthopedic Surgery

## 2011-01-03 DIAGNOSIS — M25562 Pain in left knee: Secondary | ICD-10-CM

## 2011-01-04 ENCOUNTER — Other Ambulatory Visit: Payer: Self-pay | Admitting: Orthopedic Surgery

## 2011-01-04 DIAGNOSIS — M25562 Pain in left knee: Secondary | ICD-10-CM

## 2011-01-05 ENCOUNTER — Ambulatory Visit
Admission: RE | Admit: 2011-01-05 | Discharge: 2011-01-05 | Disposition: A | Discharge status: Home / Self Care | Payer: Medicare Other | Source: Ambulatory Visit | Attending: Orthopedic Surgery | Admitting: Orthopedic Surgery

## 2011-01-05 DIAGNOSIS — M25562 Pain in left knee: Secondary | ICD-10-CM

## 2011-01-05 MED ORDER — IOHEXOL 180 MG/ML  SOLN: 25.0000 mL | Freq: Once | INTRAMUSCULAR | Status: AC | PRN

## 2011-01-24 ENCOUNTER — Ambulatory Visit (INDEPENDENT_AMBULATORY_CARE_PROVIDER_SITE_OTHER): Payer: Medicare Other | Admitting: Cardiology

## 2011-01-24 ENCOUNTER — Encounter: Payer: Self-pay | Admitting: Cardiology

## 2011-01-24 VITALS — BP 118/75 | HR 83 | Resp 12 | Ht 69.0 in | Wt 211.0 lb

## 2011-01-24 DIAGNOSIS — I509 Heart failure, unspecified: Secondary | ICD-10-CM

## 2011-01-24 DIAGNOSIS — I5022 Chronic systolic (congestive) heart failure: Secondary | ICD-10-CM | POA: Insufficient documentation

## 2011-01-24 DIAGNOSIS — Z95810 Presence of automatic (implantable) cardiac defibrillator: Secondary | ICD-10-CM

## 2011-01-24 MED ORDER — SPIRONOLACTONE 25 MG PO TABS: 25.0000 mg | ORAL_TABLET | Freq: Every day | ORAL | Status: DC

## 2011-01-24 NOTE — Progress Notes (Signed)
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  His weight has dropped a few pounds. He denies orthopnea, PND or edema.  His defibrillator has not fired.  EKG today demonstrates normal sinus rhythm and a normal EKG. Past Medical History  Diagnosis Date  . Cardiac defibrillator in place   . Obesity   . Prostate cancer   . Glucose intolerance (impaired glucose tolerance)   . Bursitis of elbow     left  . Osteoarthritis   . HTN (hypertension)   . HLD (hyperlipidemia)   . History of DVT (deep vein thrombosis)   . CHF (congestive heart failure)   . Other primary cardiomyopathies     Past Surgical History  Procedure Date  . Transurethral resection of prostate   . Cardiac defibrillator placement 10/09  . Hernia repair   . Robot assisted laparoscopic radical prostatectomy 6/08    Family History  Problem Relation Age of Onset  . Cancer Father     prostate  . Heart disease Mother     History   Social History  . Marital Status: Married    Spouse Name: N/A    Number of Children: N/A  . Years of Education: N/A   Occupational History  . Paramedic    Social History Main Topics  . Smoking status: Former Games developer  . Smokeless tobacco: Not on file   Comment: quit over 30 years ago   . Alcohol Use: Yes     occasionally  . Drug Use: No  . Sexually Active: Not on file   Other Topics Concern  . Not on file   Social History Narrative  . No narrative on file    No Known Allergies  Current Outpatient Prescriptions  Medication Sig Dispense Refill  . aspirin 81 MG tablet Take 81 mg by mouth daily.        . carvedilol (COREG) 25 MG tablet Take 25 mg by mouth 2 (two) times daily with a meal.        . digoxin (LANOXIN) 0.125 MG tablet Take 125 mcg by mouth daily.        . Ferrous Sulfate Dried (FEOSOL) 200 (65 FE) MG TABS Take by mouth 2 (two) times daily with a meal.        . furosemide  (LASIX) 20 MG tablet Take 20 mg by mouth daily.        Marland Kitchen ibuprofen (ADVIL,MOTRIN) 800 MG tablet Take 800 mg by mouth every 8 (eight) hours as needed.        . Linagliptin (TRADJENTA) 5 MG TABS Take 5 mg by mouth daily.        Marland Kitchen lisinopril (PRINIVIL,ZESTRIL) 20 MG tablet Take 20 mg by mouth 2 (two) times daily.        . simvastatin (ZOCOR) 20 MG tablet Take 10 mg by mouth at bedtime.        Marland Kitchen spironolactone (ALDACTONE) 25 MG tablet Take 1 tablet (25 mg total) by mouth daily.  30 tablet  11    ROS Negative other than HPI.   PE General Appearance: well developed, well nourished in no acute distress, obese HEENT: symmetrical face, PERRLA, good dentition  Neck: no JVD, thyromegaly, or adenopathy, trachea midline Chest: symmetric without deformity Cardiac: PMI non-displaced, RRR, normal S1, S2, no gallop, soft systolic murmurLung: clear to ausculation and percussion Vascular: all pulses full without bruits  Abdominal: nondistended, nontender, good bowel sounds, no  HSM, no bruits Extremities: no cyanosis, clubbing or edema, no sign of DVT, no varicosities  Skin: normal color, no rashes Neuro: alert and oriented x 3, non-focal Pysch: normal affect Filed Vitals:   01/24/11 0942  BP: 118/75  Pulse: 83  Resp: 12  Height: 5\' 9"  (1.753 m)  Weight: 211 lb (95.709 kg)    EKG  Labs and Studies Reviewed.   Lab Results  Component Value Date   WBC 5.3 11/24/2010   HGB 13.6 11/24/2010   HCT 40.9 11/24/2010   MCV 80.4 11/24/2010   PLT 180.0 11/24/2010      Chemistry      Component Value Date/Time   NA 142 11/24/2010 1042   K 3.8 11/24/2010 1042   CL 104 11/24/2010 1042   CO2 29 11/24/2010 1042   BUN 25* 11/24/2010 1042   CREATININE 1.2 11/24/2010 1042      Component Value Date/Time   CALCIUM 8.7 11/24/2010 1042   ALKPHOS 49 11/24/2010 1042   AST 25 11/24/2010 1042   ALT 29 11/24/2010 1042   BILITOT 0.9 11/24/2010 1042       Lab Results  Component Value Date   CHOL 151 02/08/2010   CHOL 171 11/09/2008    CHOL 146 01/30/2007   Lab Results  Component Value Date   HDL 38.70* 02/08/2010   HDL 45.10 11/09/2008   HDL 78.4 01/30/2007   Lab Results  Component Value Date   LDLCALC 98 02/08/2010   LDLCALC 98 11/09/2008   LDLCALC 83 01/30/2007   Lab Results  Component Value Date   TRIG 74.0 02/08/2010   TRIG 141.0 11/09/2008   TRIG 56 01/30/2007   Lab Results  Component Value Date   CHOLHDL 4 02/08/2010   CHOLHDL 4 11/09/2008   CHOLHDL 2.8 CALC 01/30/2007   Lab Results  Component Value Date   HGBA1C 6.7* 11/24/2010   Lab Results  Component Value Date   ALT 29 11/24/2010   AST 25 11/24/2010   ALKPHOS 49 11/24/2010   BILITOT 0.9 11/24/2010   Lab Results  Component Value Date   TSH 1.20 02/08/2010

## 2011-01-24 NOTE — Patient Instructions (Signed)
Your physician has recommended you make the following change in your medication:   Start Spironolactone  Your physician recommends that you return for lab work in: next Thursday  (02/02/11) for a bmp    Your physician recommends that you schedule a follow-up appointment in: 6 months with Dr. Daleen Squibb

## 2011-02-02 ENCOUNTER — Other Ambulatory Visit (INDEPENDENT_AMBULATORY_CARE_PROVIDER_SITE_OTHER): Payer: Medicare Other | Admitting: *Deleted

## 2011-02-02 DIAGNOSIS — I509 Heart failure, unspecified: Secondary | ICD-10-CM

## 2011-02-02 DIAGNOSIS — I5022 Chronic systolic (congestive) heart failure: Secondary | ICD-10-CM

## 2011-02-02 LAB — BASIC METABOLIC PANEL
Chloride: 106 mEq/L (ref 96–112)
Creatinine, Ser: 1.4 mg/dL (ref 0.4–1.5)
GFR: 64.55 mL/min (ref >60.00)
Potassium: 4.3 mEq/L (ref 3.5–5.1)

## 2011-02-06 ENCOUNTER — Telehealth: Payer: Self-pay | Admitting: Cardiology

## 2011-02-06 NOTE — Telephone Encounter (Signed)
Pt returned previous call.  Message to increase water was given to pt.  Also informed him that his BUN/Creat were inc.  Pt states he understands./DDL

## 2011-02-06 NOTE — Telephone Encounter (Signed)
returning call back to nurse.

## 2011-03-16 ENCOUNTER — Other Ambulatory Visit: Payer: Self-pay | Admitting: Internal Medicine

## 2011-03-23 ENCOUNTER — Encounter: Payer: Medicare Other | Admitting: *Deleted

## 2011-03-24 LAB — BASIC METABOLIC PANEL
CO2: 20 mEq/L (ref 19–32)
Calcium: 8.8 mg/dL (ref 8.4–10.5)
GFR calc Af Amer: 41 mL/min — ABNORMAL LOW (ref >60)
Sodium: 136 mEq/L (ref 135–145)

## 2011-03-24 LAB — DIFFERENTIAL
Basophils Absolute: 0 10*3/uL (ref 0.0–0.1)
Basophils Relative: 0 % (ref 0–1)
Eosinophils Absolute: 0.1 10*3/uL (ref 0.0–0.7)
Monocytes Absolute: 0.6 10*3/uL (ref 0.1–1.0)
Monocytes Relative: 8 % (ref 3–12)
Neutrophils Relative %: 69 % (ref 43–77)

## 2011-03-24 LAB — PROTIME-INR: INR: 1 (ref 0.00–1.49)

## 2011-03-24 LAB — CBC
Hemoglobin: 11.4 g/dL — ABNORMAL LOW (ref 13.0–17.0)
MCHC: 32.5 g/dL (ref 30.0–36.0)
RBC: 4.32 MIL/uL (ref 4.22–5.81)
WBC: 7.1 10*3/uL (ref 4.0–10.5)

## 2011-03-27 ENCOUNTER — Encounter: Payer: Self-pay | Admitting: *Deleted

## 2011-03-31 ENCOUNTER — Ambulatory Visit: Payer: Medicare Other | Admitting: Internal Medicine

## 2011-04-03 ENCOUNTER — Other Ambulatory Visit (INDEPENDENT_AMBULATORY_CARE_PROVIDER_SITE_OTHER): Payer: Medicare Other

## 2011-04-03 ENCOUNTER — Encounter: Payer: Self-pay | Admitting: Internal Medicine

## 2011-04-03 ENCOUNTER — Ambulatory Visit (INDEPENDENT_AMBULATORY_CARE_PROVIDER_SITE_OTHER): Payer: Medicare Other | Admitting: Internal Medicine

## 2011-04-03 VITALS — BP 102/60 | HR 70 | Temp 98.7°F | Resp 16 | Wt 209.0 lb

## 2011-04-03 DIAGNOSIS — E785 Hyperlipidemia, unspecified: Secondary | ICD-10-CM

## 2011-04-03 DIAGNOSIS — N182 Chronic kidney disease, stage 2 (mild): Secondary | ICD-10-CM

## 2011-04-03 DIAGNOSIS — Z23 Encounter for immunization: Secondary | ICD-10-CM

## 2011-04-03 DIAGNOSIS — E119 Type 2 diabetes mellitus without complications: Secondary | ICD-10-CM

## 2011-04-03 DIAGNOSIS — I10 Essential (primary) hypertension: Secondary | ICD-10-CM

## 2011-04-03 LAB — LIPID PANEL
Cholesterol: 159 mg/dL (ref 0–200)
LDL Cholesterol: 88 mg/dL (ref 0–99)
Total CHOL/HDL Ratio: 3
VLDL: 23.4 mg/dL (ref 0.0–40.0)

## 2011-04-03 LAB — COMPREHENSIVE METABOLIC PANEL
Albumin: 4.3 g/dL (ref 3.5–5.2)
BUN: 54 mg/dL — ABNORMAL HIGH (ref 6–23)
Calcium: 9.4 mg/dL (ref 8.4–10.5)
Chloride: 107 mEq/L (ref 96–112)
Creatinine, Ser: 1.6 mg/dL — ABNORMAL HIGH (ref 0.4–1.5)
Glucose, Bld: 88 mg/dL (ref 70–99)
Potassium: 5.4 mEq/L — ABNORMAL HIGH (ref 3.5–5.1)

## 2011-04-03 LAB — URINALYSIS, ROUTINE W REFLEX MICROSCOPIC
Bilirubin Urine: NEGATIVE
Hgb urine dipstick: NEGATIVE
Ketones, ur: NEGATIVE
Leukocytes, UA: NEGATIVE
Urobilinogen, UA: 0.2 (ref 0.0–1.0)

## 2011-04-03 LAB — TSH: TSH: 1.41 u[IU]/mL (ref 0.35–5.50)

## 2011-04-03 LAB — HEMOGLOBIN A1C: Hgb A1c MFr Bld: 6.9 % — ABNORMAL HIGH (ref 4.6–6.5)

## 2011-04-03 NOTE — Assessment & Plan Note (Signed)
I will check his FLP today 

## 2011-04-03 NOTE — Assessment & Plan Note (Signed)
I will check his BUN/creat and UA today

## 2011-04-03 NOTE — Patient Instructions (Signed)
Diabetes, Type 2 Diabetes is a lasting (chronic) disease. In type 2 diabetes, the pancreas does not make enough insulin (a hormone), and the body does not respond normally to the insulin that is made. This type of diabetes was also previously called adult onset diabetes. About 90% of all those who have diabetes have type 2. It usually occurs after the age of 40 but can occur at any age. CAUSES Unlike type 1 diabetes, which happens because insulin is no longer being made, type 2 diabetes happens because the body is making less insulin and has trouble using the insulin properly. SYMPTOMS  Drinking more than usual.   Urinating more than usual.   Blurred vision.   Dry, itchy skin.   Frequent infection like yeast infections in women.   More tired than usual (fatigue).  TREATMENT  Healthy eating.   Exercise.   Medication, if needed.   Monitoring blood glucose (sugar).   Seeing your caregiver regularly.  HOME CARE INSTRUCTIONS  Check your blood glucose (sugar) at least once daily. More frequent monitoring may be necessary, depending on your medications and on how well your diabetes is controlled. Your caregiver will advise you.   Take your medicine as directed by your caregiver.   Do not smoke.   Make wise food choices. Ask your caregiver for information. Weight loss can improve your diabetes.   Learn about low blood glucose (hypoglycemia) and how to treat it.   Get your eyes checked regularly.   Have a yearly physical exam. Have your blood pressure checked. Get your blood and urine tested.   Wear a pendant or bracelet saying that you have diabetes.   Check your feet every night for sores. Let your caregiver know if you have sores that are not healing.  SEEK MEDICAL CARE IF:  You are having problems keeping your blood glucose at target range.   You feel you might be having problems with your medicines.   You have symptoms of an illness that is not improving after 24  hours.   You have a sore or wound that is not healing.   You notice a change in vision or a new problem with your vision.   You develop a fever of more than 100.5.  Document Released: 06/05/2005 Document Re-Released: 06/27/2009 [employer] Patient Information 2011 [employer]. 

## 2011-04-03 NOTE — Assessment & Plan Note (Signed)
I will check his A1C and will continue tradjenta

## 2011-04-03 NOTE — Assessment & Plan Note (Signed)
His BP is well controlled 

## 2011-04-03 NOTE — Progress Notes (Signed)
Subjective:    Patient ID: [name], male    DOB: March 23, 1941, 70 y.o.   MRN: [id number]  Diabetes He presents for his follow-up diabetic visit. He has type 2 diabetes mellitus. His disease course has been stable. There are no hypoglycemic associated symptoms. Pertinent negatives for hypoglycemia include no dizziness, headaches, pallor, seizures, speech difficulty, sweats or tremors. Pertinent negatives for diabetes include no blurred vision, no chest pain, no fatigue, no foot paresthesias, no foot ulcerations, no polydipsia, no polyphagia, no polyuria, no visual change, no weakness and no weight loss. There are no hypoglycemic complications. Symptoms are stable. There are no diabetic complications. Current diabetic treatment includes oral agent (monotherapy). He is compliant with treatment all of the time. His weight is stable. He is following a generally healthy diet. Meal planning includes avoidance of concentrated sweets. He has not had a previous visit with a dietician. He never participates in exercise. His breakfast blood glucose range is generally 90-110 mg/dl. His lunch blood glucose range is generally 110-130 mg/dl. His dinner blood glucose range is generally 110-130 mg/dl. His highest blood glucose is 110-130 mg/dl. His overall blood glucose range is 110-130 mg/dl. An ACE inhibitor/angiotensin II receptor blocker is being taken. He does not see a podiatrist.Eye exam is not current.  Hypertension This is a chronic problem. The current episode started more than 1 year ago. The problem has been gradually improving since onset. The problem is controlled. Pertinent negatives include no anxiety, blurred vision, chest pain, headaches, malaise/fatigue, neck pain, orthopnea, palpitations, peripheral edema, PND, shortness of breath or sweats. Past treatments include diuretics, ACE inhibitors and beta blockers. The current treatment provides significant improvement. Hypertensive end-organ damage includes  heart failure.      Review of Systems  Constitutional: Negative for fever, chills, weight loss, malaise/fatigue, diaphoresis, activity change, appetite change, fatigue and unexpected weight change.  HENT: Negative.  Negative for neck pain.   Eyes: Negative.  Negative for blurred vision.  Respiratory: Negative for apnea, cough, choking, chest tightness, shortness of breath, wheezing and stridor.   Cardiovascular: Negative for chest pain, palpitations, orthopnea, leg swelling and PND.  Gastrointestinal: Negative for nausea, vomiting, abdominal pain, diarrhea, constipation, abdominal distention and anal bleeding.  Genitourinary: Negative for dysuria, urgency, polyuria, frequency, hematuria, flank pain, decreased urine volume, enuresis and difficulty urinating.  Musculoskeletal: Negative for myalgias, back pain, joint swelling, arthralgias and gait problem.  Skin: Negative for color change, pallor, rash and wound.  Neurological: Negative for dizziness, tremors, seizures, syncope, facial asymmetry, speech difficulty, weakness, light-headedness, numbness and headaches.  Hematological: Negative for polydipsia, polyphagia and adenopathy. Does not bruise/bleed easily.  Psychiatric/Behavioral: Negative.        Objective:   Physical Exam  Vitals reviewed. Constitutional: He is oriented to person, place, and time. He appears well-developed and well-nourished. No distress.  HENT:  Mouth/Throat: Oropharynx is clear and moist. No oropharyngeal exudate.  Eyes: Conjunctivae are normal. Right eye exhibits no discharge. Left eye exhibits no discharge. No scleral icterus.  Neck: Normal range of motion. Neck supple. No JVD present. No tracheal deviation present. No thyromegaly present.  Cardiovascular: Normal rate, regular rhythm, normal heart sounds and intact distal pulses.  Exam reveals no gallop and no friction rub.   No murmur heard. Pulmonary/Chest: Effort normal and breath sounds normal. No stridor.  No respiratory distress. He has no wheezes. He has no rales. He exhibits no tenderness.  Abdominal: Soft. Bowel sounds are normal. He exhibits no distension. There is no tenderness. There  is no rebound and no guarding.  Musculoskeletal: Normal range of motion. He exhibits no edema and no tenderness.  Lymphadenopathy:    He has no cervical adenopathy.  Neurological: He is oriented to person, place, and time. He displays normal reflexes. No cranial nerve deficit. He exhibits normal muscle tone. Coordination normal.  Skin: Skin is warm and dry. No rash noted. He is not diaphoretic. No erythema. No pallor.  Psychiatric: He has a normal mood and affect. His behavior is normal. Judgment and thought content normal.      Lab Results  Component Value Date   WBC 5.3 11/24/2010   HGB 13.6 11/24/2010   HCT 40.9 11/24/2010   PLT 180.0 11/24/2010   GLUCOSE 116* 02/02/2011   CHOL 151 02/08/2010   TRIG 74.0 02/08/2010   HDL 38.70* 02/08/2010   LDLCALC 98 02/08/2010   ALT 29 11/24/2010   AST 25 11/24/2010   NA 137 02/02/2011   K 4.3 02/02/2011   CL 106 02/02/2011   CREATININE 1.4 02/02/2011   BUN 32* 02/02/2011   CO2 24 02/02/2011   TSH 1.20 02/08/2010   PSA <0.01 ng/mL* 11/09/2008   INR 4.4 RATIO* 06/25/2008   HGBA1C 6.7* 11/24/2010   MICROALBUR 0.4 02/08/2010      Assessment & Plan:

## 2011-04-05 LAB — BASIC METABOLIC PANEL
CO2: 24
Calcium: 8.1 — ABNORMAL LOW
Glucose, Bld: 143 — ABNORMAL HIGH
Sodium: 137

## 2011-04-05 LAB — HEMOGLOBIN AND HEMATOCRIT, BLOOD
HCT: 38 — ABNORMAL LOW
Hemoglobin: 12.4 — ABNORMAL LOW

## 2011-04-06 LAB — BASIC METABOLIC PANEL
BUN: 19
GFR calc non Af Amer: >60
Glucose, Bld: 120 — ABNORMAL HIGH
Potassium: 3.9

## 2011-04-06 LAB — CBC
HCT: 40.2
MCV: 78.9
Platelets: 190
RDW: 15.8 — ABNORMAL HIGH

## 2011-04-06 LAB — TYPE AND SCREEN

## 2011-04-14 ENCOUNTER — Other Ambulatory Visit: Payer: Self-pay | Admitting: *Deleted

## 2011-04-14 ENCOUNTER — Other Ambulatory Visit (INDEPENDENT_AMBULATORY_CARE_PROVIDER_SITE_OTHER): Payer: Medicare Other

## 2011-04-14 DIAGNOSIS — I10 Essential (primary) hypertension: Secondary | ICD-10-CM

## 2011-04-14 LAB — BASIC METABOLIC PANEL
BUN: 40 mg/dL — ABNORMAL HIGH (ref 6–23)
CO2: 23 mEq/L (ref 19–32)
Chloride: 110 mEq/L (ref 96–112)
Creatinine, Ser: 1.4 mg/dL (ref 0.4–1.5)
Glucose, Bld: 117 mg/dL — ABNORMAL HIGH (ref 70–99)

## 2011-04-16 ENCOUNTER — Encounter: Payer: Self-pay | Admitting: Internal Medicine

## 2011-05-09 ENCOUNTER — Encounter: Payer: Self-pay | Admitting: Internal Medicine

## 2011-05-09 ENCOUNTER — Other Ambulatory Visit (INDEPENDENT_AMBULATORY_CARE_PROVIDER_SITE_OTHER): Payer: Medicare Other

## 2011-05-09 ENCOUNTER — Ambulatory Visit (INDEPENDENT_AMBULATORY_CARE_PROVIDER_SITE_OTHER): Payer: Medicare Other | Admitting: Internal Medicine

## 2011-05-09 VITALS — BP 118/70 | HR 78 | Temp 98.1°F | Resp 16 | Ht 69.0 in | Wt 211.0 lb

## 2011-05-09 DIAGNOSIS — E875 Hyperkalemia: Secondary | ICD-10-CM | POA: Insufficient documentation

## 2011-05-09 DIAGNOSIS — E119 Type 2 diabetes mellitus without complications: Secondary | ICD-10-CM

## 2011-05-09 DIAGNOSIS — I10 Essential (primary) hypertension: Secondary | ICD-10-CM

## 2011-05-09 DIAGNOSIS — N182 Chronic kidney disease, stage 2 (mild): Secondary | ICD-10-CM

## 2011-05-09 LAB — BASIC METABOLIC PANEL
BUN: 52 mg/dL — ABNORMAL HIGH (ref 6–23)
Calcium: 9.4 mg/dL (ref 8.4–10.5)
Creatinine, Ser: 1.9 mg/dL — ABNORMAL HIGH (ref 0.4–1.5)
GFR: 45.62 mL/min — ABNORMAL LOW (ref >60.00)
Glucose, Bld: 120 mg/dL — ABNORMAL HIGH (ref 70–99)

## 2011-05-09 NOTE — Patient Instructions (Signed)
Hyperkalemia Hyperkalemia is when you have too much potassium in your blood. This can be a life-threatening condition. Potassium is normally removed (excreted) from the body by the kidneys. CAUSES  The potassium level in your body can become too high for the following reasons:  You take in too much potassium. You can do this by:   Using salt substitutes. They contain large amounts of potassium.   Taking potassium supplements from your caregiver. The dose may be too high for you.   Eating foods or taking nutritional products with potassium.   You excrete too little potassium. This can happen if:   Your kidneys are not functioning properly. Kidney (renal) disease is a very common cause of hyperkalemia.   You are taking medicines that lower your excretion of potassium, such as certain diuretic medicines.   You have an adrenal gland disease called Addison's disease.   You have a urinary tract obstruction, such as kidney stones.   You are on treatment to mechanically clean your blood (dialysis) and you skip a treatment.   You release a high amount of potassium from your cells into your blood. You may have a condition that causes potassium to move from your cells to your bloodstream. This can happen with:   Injury to muscles or other tissues. Most potassium is stored in the muscles.   Severe burns or infections.   Acidic blood plasma (acidosis). Acidosis can result from many diseases, such as uncontrolled diabetes.  SYMPTOMS  Usually, there are no symptoms unless the potassium is dangerously high or has risen very quickly. Symptoms may include:  Irregular or very slow heartbeat.   Feeling sick to your stomach (nauseous).   Tiredness (fatigue).   Nerve problems such as tingling of the skin, numbness of the hands or feet, weakness, or paralysis.  DIAGNOSIS  A simple blood test can measure the amount of potassium in your body. An electrocardiogram test of the heart can also help  make the diagnosis. The heart may beat dangerously fast or slow down and stop beating with severe hyperkalemia.  TREATMENT  Treatment depends on how bad the condition is and on the underlying cause.  If the hyperkalemia is an emergency (causing heart problems or paralysis), many different medicines can be used alone or together to lower the potassium level briefly. This may include an insulin injection even if you are not diabetic. Emergency dialysis may be needed to remove potassium from the body.   If the hyperkalemia is less severe or dangerous, the underlying cause is treated. This can include taking medicines if needed. Your prescription medicines may be changed. You may also need to take a medicine to help your body get rid of potassium. You may need to eat a diet low in potassium.  HOME CARE INSTRUCTIONS   Take medicines and supplements as directed by your caregiver.   Do not take any over-the-counter medicines, supplements, natural products, herbs, or vitamins without reviewing them with your caregiver. Certain supplements and natural food products can have high amounts of potassium. Other products (such as ibuprofen) can damage weak kidneys and raise your potassium.   You may be asked to do repeat lab tests. Be sure to follow these directions.   If you have kidney disease, you may need to follow a low potassium diet.  SEEK MEDICAL CARE IF:   You notice an irregular or very slow heartbeat.   You feel lightheaded.   You develop weakness that is unusual for you.  SEEK   IMMEDIATE MEDICAL CARE IF:   You have shortness of breath.   You have chest discomfort.   You pass out (faint).  MAKE SURE YOU:   Understand these instructions.   Will watch your condition.   Will get help right away if you are not doing well or get worse.  Document Released: 05/26/2002 Document Revised: 02/15/2011 Document Reviewed: 11/10/2010 [employer] Patient Information 2012 [employer]. 

## 2011-05-09 NOTE — Assessment & Plan Note (Signed)
His BP is well controlled 

## 2011-05-09 NOTE — Assessment & Plan Note (Signed)
He takes 2 agents that can cause high K+ (spironolactone and lisinopril), I will recheck his K+ level today and will consider stopping these agents if needed

## 2011-05-09 NOTE — Assessment & Plan Note (Signed)
I will recheck his renal function and monitor his lytes today

## 2011-05-09 NOTE — Progress Notes (Signed)
Subjective:    Patient ID: [name], male    DOB: 03-Aug-1940, 70 y.o.   MRN: [id number]  Hypertension This is a chronic problem. The current episode started more than 1 year ago. The problem has been gradually improving since onset. The problem is controlled. Pertinent negatives include no anxiety, blurred vision, chest pain, headaches, malaise/fatigue, neck pain, orthopnea, palpitations, peripheral edema, PND, shortness of breath or sweats. Past treatments include beta blockers, diuretics and ACE inhibitors. The current treatment provides significant improvement. Compliance problems include exercise and diet.  Identifiable causes of hypertension include chronic renal disease.      Review of Systems  Constitutional: Negative for fever, chills, malaise/fatigue, diaphoresis, activity change, appetite change, fatigue and unexpected weight change.  HENT: Negative.  Negative for neck pain.   Eyes: Negative.  Negative for blurred vision.  Respiratory: Negative for cough, shortness of breath, wheezing and stridor.   Cardiovascular: Negative for chest pain, palpitations, orthopnea, leg swelling and PND.  Gastrointestinal: Negative for nausea, vomiting, abdominal pain, diarrhea and constipation.  Genitourinary: Negative for dysuria, urgency, frequency, hematuria, flank pain, decreased urine volume, enuresis and difficulty urinating.  Musculoskeletal: Negative for myalgias, back pain, joint swelling, arthralgias and gait problem.  Skin: Negative for color change, pallor, rash and wound.  Neurological: Negative for dizziness, tremors, seizures, syncope, facial asymmetry, speech difficulty, weakness, light-headedness, numbness and headaches.  Hematological: Negative for adenopathy. Does not bruise/bleed easily.  Psychiatric/Behavioral: Negative.        Objective:   Physical Exam  Vitals reviewed. Constitutional: He is oriented to person, place, and time. He appears well-developed and  well-nourished. No distress.  HENT:  Head: Normocephalic and atraumatic.  Mouth/Throat: Oropharynx is clear and moist. No oropharyngeal exudate.  Eyes: Conjunctivae are normal. Right eye exhibits no discharge. Left eye exhibits no discharge. No scleral icterus.  Neck: Normal range of motion. Neck supple. No JVD present. No tracheal deviation present. No thyromegaly present.  Cardiovascular: Normal rate, regular rhythm, normal heart sounds and intact distal pulses.  Exam reveals no gallop and no friction rub.   No murmur heard. Pulmonary/Chest: Effort normal and breath sounds normal. No stridor. No respiratory distress. He has no wheezes. He has no rales. He exhibits no tenderness.  Abdominal: Soft. Bowel sounds are normal. He exhibits no distension and no mass. There is no tenderness. There is no rebound and no guarding.  Musculoskeletal: Normal range of motion. He exhibits no edema and no tenderness.  Lymphadenopathy:    He has no cervical adenopathy.  Neurological: He is oriented to person, place, and time.  Skin: Skin is warm and dry. No rash noted. He is not diaphoretic. No erythema. No pallor.  Psychiatric: He has a normal mood and affect. His behavior is normal. Judgment and thought content normal.      Lab Results  Component Value Date   WBC 5.3 11/24/2010   HGB 13.6 11/24/2010   HCT 40.9 11/24/2010   PLT 180.0 11/24/2010   GLUCOSE 117* 04/14/2011   CHOL 159 04/03/2011   TRIG 117.0 04/03/2011   HDL 47.50 04/03/2011   LDLCALC 88 04/03/2011   ALT 25 04/03/2011   AST 20 04/03/2011   NA 138 04/14/2011   K 5.2* 04/14/2011   CL 110 04/14/2011   CREATININE 1.4 04/14/2011   BUN 40* 04/14/2011   CO2 23 04/14/2011   TSH 1.41 04/03/2011   PSA <0.01 ng/mL* 11/09/2008   INR 4.4 RATIO* 06/25/2008   HGBA1C 6.9* 04/03/2011   MICROALBUR 0.4  02/08/2010      Assessment & Plan:

## 2011-05-09 NOTE — Assessment & Plan Note (Signed)
His most recent a1c looks good

## 2011-05-10 ENCOUNTER — Encounter: Payer: Self-pay | Admitting: Internal Medicine

## 2011-05-23 ENCOUNTER — Other Ambulatory Visit: Payer: Self-pay | Admitting: *Deleted

## 2011-05-23 ENCOUNTER — Other Ambulatory Visit (INDEPENDENT_AMBULATORY_CARE_PROVIDER_SITE_OTHER): Payer: Medicare Other

## 2011-05-23 DIAGNOSIS — E875 Hyperkalemia: Secondary | ICD-10-CM

## 2011-05-23 LAB — BASIC METABOLIC PANEL
Calcium: 8.7 mg/dL (ref 8.4–10.5)
Creatinine, Ser: 1.4 mg/dL (ref 0.4–1.5)

## 2011-05-23 NOTE — Progress Notes (Signed)
Patient will make F/U office visit after lab.

## 2011-05-31 ENCOUNTER — Ambulatory Visit (INDEPENDENT_AMBULATORY_CARE_PROVIDER_SITE_OTHER)
Admission: RE | Admit: 2011-05-31 | Discharge: 2011-05-31 | Disposition: A | Discharge status: Home / Self Care | Payer: Medicare Other | Source: Ambulatory Visit | Attending: Internal Medicine | Admitting: Internal Medicine

## 2011-05-31 ENCOUNTER — Other Ambulatory Visit (INDEPENDENT_AMBULATORY_CARE_PROVIDER_SITE_OTHER): Payer: Medicare Other

## 2011-05-31 ENCOUNTER — Ambulatory Visit (INDEPENDENT_AMBULATORY_CARE_PROVIDER_SITE_OTHER): Payer: Medicare Other | Admitting: Internal Medicine

## 2011-05-31 ENCOUNTER — Encounter: Payer: Self-pay | Admitting: Internal Medicine

## 2011-05-31 VITALS — BP 140/72 | HR 90 | Temp 98.0°F | Resp 16 | Wt 214.0 lb

## 2011-05-31 DIAGNOSIS — M25529 Pain in unspecified elbow: Secondary | ICD-10-CM

## 2011-05-31 DIAGNOSIS — M25522 Pain in left elbow: Secondary | ICD-10-CM

## 2011-05-31 DIAGNOSIS — R7989 Other specified abnormal findings of blood chemistry: Secondary | ICD-10-CM

## 2011-05-31 DIAGNOSIS — M7989 Other specified soft tissue disorders: Secondary | ICD-10-CM

## 2011-05-31 DIAGNOSIS — M109 Gout, unspecified: Secondary | ICD-10-CM

## 2011-05-31 DIAGNOSIS — R791 Abnormal coagulation profile: Secondary | ICD-10-CM

## 2011-05-31 LAB — CBC WITH DIFFERENTIAL/PLATELET
Basophils Relative: 0.4 % (ref 0.0–3.0)
Eosinophils Absolute: 0.1 10*3/uL (ref 0.0–0.7)
Eosinophils Relative: 1.3 % (ref 0.0–5.0)
Lymphocytes Relative: 12.4 % (ref 12.0–46.0)
MCHC: 32.8 g/dL (ref 30.0–36.0)
Neutrophils Relative %: 78.3 % — ABNORMAL HIGH (ref 43.0–77.0)
Platelets: 238 10*3/uL (ref 150.0–400.0)
RBC: 4.09 Mil/uL — ABNORMAL LOW (ref 4.22–5.81)
WBC: 7.9 10*3/uL (ref 4.5–10.5)

## 2011-05-31 LAB — BASIC METABOLIC PANEL
BUN: 15 mg/dL (ref 6–23)
Chloride: 103 mEq/L (ref 96–112)
Potassium: 4 mEq/L (ref 3.5–5.1)
Sodium: 139 mEq/L (ref 135–145)

## 2011-05-31 LAB — SEDIMENTATION RATE: Sed Rate: 65 mm/hr — ABNORMAL HIGH (ref 0–22)

## 2011-05-31 MED ORDER — METHYLPREDNISOLONE ACETATE 80 MG/ML IJ SUSP
120.0000 mg | Freq: Once | INTRAMUSCULAR | Status: AC
  Administered 2011-05-31: 120 mg via INTRAMUSCULAR

## 2011-05-31 MED ORDER — COLCHICINE 0.6 MG PO TABS: 0.6000 mg | ORAL_TABLET | Freq: Two times a day (BID) | ORAL | Status: DC

## 2011-05-31 NOTE — Progress Notes (Addendum)
Subjective:    Patient ID: [name], male    DOB: [birth year], 70 y.o.   MRN: [id number]  HPI  He returns c/o the spontaneous development of pain and swelling in his left elbow that radiates into his left hand. He did not injure the area.  Review of Systems  Constitutional: Negative.   HENT: Negative.   Eyes: Negative.   Respiratory: Negative.   Cardiovascular: Negative.   Gastrointestinal: Negative.   Genitourinary: Negative.   Musculoskeletal: Positive for joint swelling. Negative for myalgias, back pain, arthralgias and gait problem.  Skin: Negative for color change, pallor and rash.  Neurological: Negative.   Hematological: Negative for adenopathy. Does not bruise/bleed easily.  Psychiatric/Behavioral: Negative.        Objective:   Physical Exam  Vitals reviewed. Constitutional: He appears well-developed and well-nourished. No distress.  HENT:  Head: Normocephalic and atraumatic.  Eyes: Conjunctivae are normal. Right eye exhibits no discharge. Left eye exhibits no discharge. No scleral icterus.  Neck: Normal range of motion. Neck supple. No JVD present. No tracheal deviation present. No thyromegaly present.  Cardiovascular: Normal rate and regular rhythm.   Murmur heard. Pulses:      Carotid pulses are 1+ on the right side, and 1+ on the left side.      Radial pulses are 1+ on the right side, and 1+ on the left side.       Femoral pulses are 1+ on the right side, and 1+ on the left side.      Popliteal pulses are 1+ on the right side, and 1+ on the left side.       Dorsalis pedis pulses are 1+ on the right side, and 1+ on the left side.       Posterior tibial pulses are 1+ on the right side, and 1+ on the left side.  Pulmonary/Chest: Effort normal and breath sounds normal. No stridor. No respiratory distress. He has no wheezes. He exhibits no tenderness.  Abdominal: Soft. Bowel sounds are normal. He exhibits no distension and no mass. There is no tenderness. There is  no rebound and no guarding.  Musculoskeletal: He exhibits edema and tenderness.       Left elbow: He exhibits decreased range of motion and swelling. He exhibits no effusion, no deformity and no laceration. tenderness found. Olecranon process tenderness noted.       Left hand: He exhibits tenderness and swelling. He exhibits normal range of motion, no bony tenderness, no deformity and no laceration. normal sensation noted. Normal strength noted.       Left hand shows diffuse non-pitting edema but there is no erythema, streaking, or warmth.  Lymphadenopathy:    He has no cervical adenopathy.  Skin: He is not diaphoretic.          Lab Results  Component Value Date   WBC 5.3 11/24/2010   HGB 13.6 11/24/2010   HCT 40.9 11/24/2010   PLT 180.0 11/24/2010   GLUCOSE 155* 05/23/2011   CHOL 159 04/03/2011   TRIG 117.0 04/03/2011   HDL 47.50 04/03/2011   LDLCALC 88 04/03/2011   ALT 25 04/03/2011   AST 20 04/03/2011   NA 143 05/23/2011   K 3.9 05/23/2011   CL 109 05/23/2011   CREATININE 1.4 05/23/2011   BUN 20 05/23/2011   CO2 26 05/23/2011   TSH 1.41 04/03/2011   PSA <0.01 ng/mL* 11/09/2008   INR 4.4 RATIO* 06/25/2008   HGBA1C 6.9* 04/03/2011   MICROALBUR 0.4 02/08/2010  Assessment & Plan:

## 2011-05-31 NOTE — Assessment & Plan Note (Signed)
I will check a plain film to look for fracture, DJD, effusion, etc

## 2011-05-31 NOTE — Assessment & Plan Note (Signed)
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

## 2011-05-31 NOTE — Assessment & Plan Note (Signed)
This may be caused by the gout but I will check a d-dimer and if that is elevated I will ask him to do an U/S of his left arm to look for a DVT

## 2011-05-31 NOTE — Patient Instructions (Signed)
Gout Gout is an inflammatory condition (arthritis) caused by a buildup of uric acid crystals in the joints. Uric acid is a chemical that is normally present in the blood. Under some circumstances, uric acid can form into crystals in your joints. This causes joint redness, soreness, and swelling (inflammation). Repeat attacks are common. Over time, uric acid crystals can form into masses (tophi) near a joint, causing disfigurement. Gout is treatable and often preventable. CAUSES  The disease begins with elevated levels of uric acid in the blood. Uric acid is produced by your body when it breaks down a naturally found substance called purines. This also happens when you eat certain foods such as meats and fish. Causes of an elevated uric acid level include:  Being passed down from parent to child (heredity).   Diseases that cause increased uric acid production (obesity, psoriasis, some cancers).   Excessive alcohol use.   Diet, especially diets rich in meat and seafood.   Medicines, including certain cancer-fighting drugs (chemotherapy), diuretics, and aspirin.   Chronic kidney disease. The kidneys are no longer able to remove uric acid well.   Problems with metabolism.  Conditions strongly associated with gout include:  Obesity.   High blood pressure.   High cholesterol.   Diabetes.  Not everyone with elevated uric acid levels gets gout. It is not understood why some people get gout and others do not. Surgery, joint injury, and eating too much of certain foods are some of the factors that can lead to gout. SYMPTOMS   An attack of gout comes on quickly. It causes intense pain with redness, swelling, and warmth in a joint.   Fever can occur.   Often, only one joint is involved. Certain joints are more commonly involved:   Base of the big toe.   Knee.   Ankle.   Wrist.   Finger.  Without treatment, an attack usually goes away in a few days to weeks. Between attacks, you  usually will not have symptoms, which is different from many other forms of arthritis. DIAGNOSIS  Your caregiver will suspect gout based on your symptoms and exam. Removal of fluid from the joint (arthrocentesis) is done to check for uric acid crystals. Your caregiver will give you a medicine that numbs the area (local anesthetic) and use a needle to remove joint fluid for exam. Gout is confirmed when uric acid crystals are seen in joint fluid, using a special microscope. Sometimes, blood, urine, and X-ray tests are also used. TREATMENT  There are 2 phases to gout treatment: treating the sudden onset (acute) attack and preventing attacks (prophylaxis). Treatment of an Acute Attack  Medicines are used. These include anti-inflammatory medicines or steroid medicines.   An injection of steroid medicine into the affected joint is sometimes necessary.   The painful joint is rested. Movement can worsen the arthritis.   You may use warm or cold treatments on painful joints, depending which works best for you.   Discuss the use of coffee, vitamin C, or cherries with your caregiver. These may be helpful treatment options.  Treatment to Prevent Attacks After the acute attack subsides, your caregiver may advise prophylactic medicine. These medicines either help your kidneys eliminate uric acid from your body or decrease your uric acid production. You may need to stay on these medicines for a very long time. The early phase of treatment with prophylactic medicine can be associated with an increase in acute gout attacks. For this reason, during the first few months   of treatment, your caregiver may also advise you to take medicines usually used for acute gout treatment. Be sure you understand your caregiver's directions. You should also discuss dietary treatment with your caregiver. Certain foods such as meats and fish can increase uric acid levels. Other foods such as dairy can decrease levels. Your caregiver  can give you a list of foods to avoid. HOME CARE INSTRUCTIONS   Do not take aspirin to relieve pain. This raises uric acid levels.   Only take over-the-counter or prescription medicines for pain, discomfort, or fever as directed by your caregiver.   Rest the joint as much as possible. When in bed, keep sheets and blankets off painful areas.   Keep the affected joint raised (elevated).   Use crutches if the painful joint is in your leg.   Drink enough water and fluids to keep your urine clear or pale yellow. This helps your body get rid of uric acid. Do not drink alcoholic beverages. They slow the passage of uric acid.   Follow your caregiver's dietary instructions. Pay careful attention to the amount of protein you eat. Your daily diet should emphasize fruits, vegetables, whole grains, and fat-free or low-fat milk products.   Maintain a healthy body weight.  SEEK MEDICAL CARE IF:   You have an oral temperature above 102 F (38.9 C).   You develop diarrhea, vomiting, or any side effects from medicines.   You do not feel better in 24 hours, or you are getting worse.  SEEK IMMEDIATE MEDICAL CARE IF:   Your joint becomes suddenly more tender and you have:   Chills.   An oral temperature above 102 F (38.9 C), not controlled by medicine.  MAKE SURE YOU:   Understand these instructions.   Will watch your condition.   Will get help right away if you are not doing well or get worse.  Document Released: 06/02/2000 Document Revised: 02/15/2011 Document Reviewed: 09/13/2009 [employer] Patient Information 2012 [employer]. 

## 2011-06-01 ENCOUNTER — Encounter: Payer: Medicare Other | Admitting: Cardiology

## 2011-06-01 LAB — D-DIMER, QUANTITATIVE: D-Dimer, Quant: 3.52 ug/mL-FEU — ABNORMAL HIGH (ref 0.00–0.48)

## 2011-06-01 NOTE — Progress Notes (Signed)
Addended by: Etta Grandchild on: 06/01/2011 07:34 AM   Modules accepted: Orders

## 2011-06-02 ENCOUNTER — Encounter (INDEPENDENT_AMBULATORY_CARE_PROVIDER_SITE_OTHER): Payer: Medicare Other | Admitting: *Deleted

## 2011-06-02 DIAGNOSIS — R609 Edema, unspecified: Secondary | ICD-10-CM

## 2011-06-02 DIAGNOSIS — M7989 Other specified soft tissue disorders: Secondary | ICD-10-CM

## 2011-06-02 DIAGNOSIS — M79609 Pain in unspecified limb: Secondary | ICD-10-CM

## 2011-06-02 DIAGNOSIS — R7989 Other specified abnormal findings of blood chemistry: Secondary | ICD-10-CM

## 2011-06-14 ENCOUNTER — Ambulatory Visit (INDEPENDENT_AMBULATORY_CARE_PROVIDER_SITE_OTHER): Payer: Medicare Other | Admitting: Internal Medicine

## 2011-06-14 ENCOUNTER — Encounter: Payer: Self-pay | Admitting: Internal Medicine

## 2011-06-14 VITALS — BP 128/76 | HR 80 | Temp 98.0°F | Resp 16 | Wt 205.0 lb

## 2011-06-14 DIAGNOSIS — M1030 Gout due to renal impairment, unspecified site: Secondary | ICD-10-CM | POA: Insufficient documentation

## 2011-06-14 DIAGNOSIS — E875 Hyperkalemia: Secondary | ICD-10-CM

## 2011-06-14 DIAGNOSIS — I10 Essential (primary) hypertension: Secondary | ICD-10-CM

## 2011-06-14 DIAGNOSIS — M109 Gout, unspecified: Secondary | ICD-10-CM

## 2011-06-14 DIAGNOSIS — N289 Disorder of kidney and ureter, unspecified: Secondary | ICD-10-CM

## 2011-06-14 MED ORDER — FEBUXOSTAT 40 MG PO TABS: 40.0000 mg | ORAL_TABLET | Freq: Every day | ORAL | Status: DC

## 2011-06-14 NOTE — Assessment & Plan Note (Signed)
Stop the ACEI and spironolactone

## 2011-06-14 NOTE — Assessment & Plan Note (Signed)
His BP is well controlled 

## 2011-06-14 NOTE — Assessment & Plan Note (Signed)
This has resolved.

## 2011-06-14 NOTE — Assessment & Plan Note (Signed)
Continue colchicine and start uloric to prevent another episode

## 2011-06-14 NOTE — Patient Instructions (Signed)
Purine Restricted Diet A low-purine diet consists of foods that reduce uric acid made in your body. INDICATIONS FOR USE  Your caregiver may ask you to follow a low-purine diet to reduce gout flairs.  GUIDELINES  Avoid high-purine foods, including all alcohol, yeast extracts taken as supplements, and sauces made from meats (like gravy). Do not eat high-purine meats, including anchovies, sardines, herring, mussels, tuna, codfish, scallops, trout, haddock, bacon, organ meats, tripe, goose, wild game, and sweetbreads.  Grains  Allowed/Recommended: All, except those listed to consume in moderation.   Consume in Moderation: Oatmeal (? cup uncooked daily), wheat bran or germ ( cup daily), and whole grains.  Vegetables  Allowed/Recommended: All, except those listed to consume in moderation.   Consume in Moderation: Asparagus, cauliflower, spinach, mushrooms, and green peas ( cup daily).  Fruit  Allowed/Recommended: All.   Consume in Moderation: None.  Meat and Meat Substitutes  Allowed/Recommended: Eggs, nuts, and peanut butter.   Consume in Moderation: Limit to 4 to 6 oz daily. Avoid high-purine meats. Lentils, peas, and dried beans (1 cup daily).  Milk  Allowed/Recommended: All. Choose low-fat or skim when possible.   Consume in Moderation: None.  Fats and Oils  Allowed/Recommended: All.   Consume in Moderation: None.  Beverages  Allowed/Recommended: All, except those listed to avoid.   Avoid: All alcohol.  Condiments/Miscellaneous  Allowed/Recommended: All, except those listed to consume in moderation.   Consume in Moderation: Bouillon and meat-based broths and soups.  Document Released: 09/30/2010 Document Revised: 02/15/2011 Document Reviewed: 09/30/2010 [employer] Patient Information 2012 [place], [place].Gout Gout is an inflammatory condition (arthritis) caused by a buildup of uric acid crystals in the joints. Uric acid is a chemical that is normally present in the  blood. Under some circumstances, uric acid can form into crystals in your joints. This causes joint redness, soreness, and swelling (inflammation). Repeat attacks are common. Over time, uric acid crystals can form into masses (tophi) near a joint, causing disfigurement. Gout is treatable and often preventable. CAUSES  The disease begins with elevated levels of uric acid in the blood. Uric acid is produced by your body when it breaks down a naturally found substance called purines. This also happens when you eat certain foods such as meats and fish. Causes of an elevated uric acid level include:  Being passed down from parent to child (heredity).   Diseases that cause increased uric acid production (obesity, psoriasis, some cancers).   Excessive alcohol use.   Diet, especially diets rich in meat and seafood.   Medicines, including certain cancer-fighting drugs (chemotherapy), diuretics, and aspirin.   Chronic kidney disease. The kidneys are no longer able to remove uric acid well.   Problems with metabolism.  Conditions strongly associated with gout include:  Obesity.   High blood pressure.   High cholesterol.   Diabetes.  Not everyone with elevated uric acid levels gets gout. It is not understood why some people get gout and others do not. Surgery, joint injury, and eating too much of certain foods are some of the factors that can lead to gout. SYMPTOMS   An attack of gout comes on quickly. It causes intense pain with redness, swelling, and warmth in a joint.   Fever can occur.   Often, only one joint is involved. Certain joints are more commonly involved:   Base of the big toe.   Knee.   Ankle.   Wrist.   Finger.  Without treatment, an attack usually goes away in a few  days to weeks. Between attacks, you usually will not have symptoms, which is different from many other forms of arthritis. DIAGNOSIS  Your caregiver will suspect gout based on your symptoms and exam.  Removal of fluid from the joint (arthrocentesis) is done to check for uric acid crystals. Your caregiver will give you a medicine that numbs the area (local anesthetic) and use a needle to remove joint fluid for exam. Gout is confirmed when uric acid crystals are seen in joint fluid, using a special microscope. Sometimes, blood, urine, and X-ray tests are also used. TREATMENT  There are 2 phases to gout treatment: treating the sudden onset (acute) attack and preventing attacks (prophylaxis). Treatment of an Acute Attack  Medicines are used. These include anti-inflammatory medicines or steroid medicines.   An injection of steroid medicine into the affected joint is sometimes necessary.   The painful joint is rested. Movement can worsen the arthritis.   You may use warm or cold treatments on painful joints, depending which works best for you.   Discuss the use of coffee, vitamin C, or cherries with your caregiver. These may be helpful treatment options.  Treatment to Prevent Attacks After the acute attack subsides, your caregiver may advise prophylactic medicine. These medicines either help your kidneys eliminate uric acid from your body or decrease your uric acid production. You may need to stay on these medicines for a very long time. The early phase of treatment with prophylactic medicine can be associated with an increase in acute gout attacks. For this reason, during the first few months of treatment, your caregiver may also advise you to take medicines usually used for acute gout treatment. Be sure you understand your caregiver's directions. You should also discuss dietary treatment with your caregiver. Certain foods such as meats and fish can increase uric acid levels. Other foods such as dairy can decrease levels. Your caregiver can give you a list of foods to avoid. HOME CARE INSTRUCTIONS   Do not take aspirin to relieve pain. This raises uric acid levels.   Only take over-the-counter or  prescription medicines for pain, discomfort, or fever as directed by your caregiver.   Rest the joint as much as possible. When in bed, keep sheets and blankets off painful areas.   Keep the affected joint raised (elevated).   Use crutches if the painful joint is in your leg.   Drink enough water and fluids to keep your urine clear or pale yellow. This helps your body get rid of uric acid. Do not drink alcoholic beverages. They slow the passage of uric acid.   Follow your caregiver's dietary instructions. Pay careful attention to the amount of protein you eat. Your daily diet should emphasize fruits, vegetables, whole grains, and fat-free or low-fat milk products.   Maintain a healthy body weight.  SEEK MEDICAL CARE IF:   You have an oral temperature above 102 F (38.9 C).   You develop diarrhea, vomiting, or any side effects from medicines.   You do not feel better in 24 hours, or you are getting worse.  SEEK IMMEDIATE MEDICAL CARE IF:   Your joint becomes suddenly more tender and you have:   Chills.   An oral temperature above 102 F (38.9 C), not controlled by medicine.  MAKE SURE YOU:   Understand these instructions.   Will watch your condition.   Will get help right away if you are not doing well or get worse.  Document Released: 06/02/2000 Document Revised: 02/15/2011 Document  Reviewed: 09/13/2009 [facility] [address] Patient Information 2012 [place], [place].

## 2011-06-14 NOTE — Progress Notes (Signed)
  Subjective:    Patient ID: [name], male    DOB: 1941/03/21, 70 y.o.   MRN: [id number]  HPI He returns for f/up and he tells me that the pain and swelling in his left elbow and forearm has resolved. His d-dimer was elevated but the u/s was negative for dvt.   Review of Systems  Constitutional: Negative.   HENT: Negative.   Eyes: Negative.   Respiratory: Negative.   Cardiovascular: Negative.   Gastrointestinal: Negative.   Genitourinary: Negative.   Musculoskeletal: Negative.   Skin: Negative.   Neurological: Negative.   Hematological: Negative.   Psychiatric/Behavioral: Negative.        Objective:   Physical Exam  Vitals reviewed. Constitutional: He is oriented to person, place, and time. He appears well-developed and well-nourished. No distress.  HENT:  Head: Normocephalic and atraumatic.  Mouth/Throat: Oropharynx is clear and moist. No oropharyngeal exudate.  Eyes: Conjunctivae are normal. Right eye exhibits no discharge. Left eye exhibits no discharge. No scleral icterus.  Neck: Normal range of motion. Neck supple. No JVD present. No tracheal deviation present. No thyromegaly present.  Cardiovascular: Normal rate, regular rhythm, normal heart sounds and intact distal pulses.  Exam reveals no gallop and no friction rub.   Pulmonary/Chest: Effort normal and breath sounds normal. No stridor. No respiratory distress. He has no wheezes. He has no rales. He exhibits no tenderness.  Abdominal: Soft. Bowel sounds are normal. He exhibits no distension. There is no tenderness. There is no rebound and no guarding.  Musculoskeletal: Normal range of motion. He exhibits no edema.  Lymphadenopathy:    He has no cervical adenopathy.  Neurological: He is oriented to person, place, and time.  Skin: Skin is warm and dry. No rash noted. He is not diaphoretic. No erythema. No pallor.  Psychiatric: He has a normal mood and affect. His behavior is normal. Judgment and thought content normal.       Lab Results  Component Value Date   WBC 7.9 05/31/2011   HGB 10.8* 05/31/2011   HCT 33.0* 05/31/2011   PLT 238.0 05/31/2011   GLUCOSE 186* 05/31/2011   CHOL 159 04/03/2011   TRIG 117.0 04/03/2011   HDL 47.50 04/03/2011   LDLCALC 88 04/03/2011   ALT 25 04/03/2011   AST 20 04/03/2011   NA 139 05/31/2011   K 4.0 05/31/2011   CL 103 05/31/2011   CREATININE 1.1 05/31/2011   BUN 15 05/31/2011   CO2 29 05/31/2011   TSH 1.41 04/03/2011   PSA <0.01 ng/mL* 11/09/2008   INR 4.4 RATIO* 06/25/2008   HGBA1C 6.9* 04/03/2011   MICROALBUR 0.4 02/08/2010      Assessment & Plan:

## 2011-07-26 ENCOUNTER — Ambulatory Visit: Payer: Medicare Other | Admitting: Cardiology

## 2011-08-08 ENCOUNTER — Ambulatory Visit: Payer: Medicare Other | Admitting: Internal Medicine

## 2011-08-10 ENCOUNTER — Other Ambulatory Visit (INDEPENDENT_AMBULATORY_CARE_PROVIDER_SITE_OTHER): Payer: Medicare Other

## 2011-08-10 ENCOUNTER — Encounter: Payer: Self-pay | Admitting: Internal Medicine

## 2011-08-10 ENCOUNTER — Ambulatory Visit (INDEPENDENT_AMBULATORY_CARE_PROVIDER_SITE_OTHER): Payer: Medicare Other | Admitting: Internal Medicine

## 2011-08-10 DIAGNOSIS — D649 Anemia, unspecified: Secondary | ICD-10-CM

## 2011-08-10 DIAGNOSIS — I509 Heart failure, unspecified: Secondary | ICD-10-CM

## 2011-08-10 DIAGNOSIS — N058 Unspecified nephritic syndrome with other morphologic changes: Secondary | ICD-10-CM

## 2011-08-10 DIAGNOSIS — N182 Chronic kidney disease, stage 2 (mild): Secondary | ICD-10-CM

## 2011-08-10 DIAGNOSIS — E1165 Type 2 diabetes mellitus with hyperglycemia: Secondary | ICD-10-CM

## 2011-08-10 DIAGNOSIS — M1030 Gout due to renal impairment, unspecified site: Secondary | ICD-10-CM

## 2011-08-10 DIAGNOSIS — E1129 Type 2 diabetes mellitus with other diabetic kidney complication: Secondary | ICD-10-CM

## 2011-08-10 DIAGNOSIS — D510 Vitamin B12 deficiency anemia due to intrinsic factor deficiency: Secondary | ICD-10-CM

## 2011-08-10 DIAGNOSIS — I5022 Chronic systolic (congestive) heart failure: Secondary | ICD-10-CM

## 2011-08-10 DIAGNOSIS — I10 Essential (primary) hypertension: Secondary | ICD-10-CM

## 2011-08-10 DIAGNOSIS — N289 Disorder of kidney and ureter, unspecified: Secondary | ICD-10-CM

## 2011-08-10 DIAGNOSIS — M109 Gout, unspecified: Secondary | ICD-10-CM

## 2011-08-10 LAB — CBC WITH DIFFERENTIAL/PLATELET
Basophils Absolute: 0.1 10*3/uL (ref 0.0–0.1)
HCT: 35.7 % — ABNORMAL LOW (ref 39.0–52.0)
Hemoglobin: 11.7 g/dL — ABNORMAL LOW (ref 13.0–17.0)
Lymphs Abs: 1.3 10*3/uL (ref 0.7–4.0)
Monocytes Relative: 11.8 % (ref 3.0–12.0)
Neutro Abs: 3 10*3/uL (ref 1.4–7.7)
RDW: 17.6 % — ABNORMAL HIGH (ref 11.5–14.6)

## 2011-08-10 LAB — VITAMIN B12: Vitamin B-12: 711 pg/mL (ref 211–911)

## 2011-08-10 LAB — IBC PANEL
Saturation Ratios: 15.1 % — ABNORMAL LOW (ref 20.0–50.0)
Transferrin: 203.3 mg/dL — ABNORMAL LOW (ref 212.0–360.0)

## 2011-08-10 LAB — MICROALBUMIN / CREATININE URINE RATIO
Creatinine,U: 174.3 mg/dL
Microalb, Ur: 14.1 mg/dL — ABNORMAL HIGH (ref 0.0–1.9)

## 2011-08-10 NOTE — Assessment & Plan Note (Signed)
I will monitor his renal function today 

## 2011-08-10 NOTE — Assessment & Plan Note (Signed)
For a B12 and folate level today

## 2011-08-10 NOTE — Assessment & Plan Note (Signed)
I will check his CBC and will look at his vitamin levels as well 

## 2011-08-10 NOTE — Assessment & Plan Note (Signed)
He has a normal volume status today. I will check his digoxin level.

## 2011-08-10 NOTE — Assessment & Plan Note (Signed)
I will check his a1c today 

## 2011-08-10 NOTE — Patient Instructions (Signed)

## 2011-08-10 NOTE — Assessment & Plan Note (Signed)
I will check his lytes and renal function today 

## 2011-08-10 NOTE — Assessment & Plan Note (Signed)
I will check his uric acid level and his renal function today 

## 2011-08-10 NOTE — Progress Notes (Signed)
Subjective:    Patient ID: [name], male    DOB: 1940-09-17, 71 y.o.   MRN: [id number]  Anemia Presents for follow-up visit. There has been no abdominal pain, anorexia, bruising/bleeding easily, confusion, fever, leg swelling, light-headedness, malaise/fatigue, pallor, palpitations, paresthesias, pica or weight loss. Signs of blood loss that are not present include hematemesis, hematochezia and melena.  Diabetes He has type 2 diabetes mellitus. His disease course has been stable. There are no hypoglycemic associated symptoms. Pertinent negatives for hypoglycemia include no confusion, dizziness or pallor. Pertinent negatives for diabetes include no blurred vision, no chest pain, no fatigue, no foot paresthesias, no foot ulcerations, no polydipsia, no polyphagia, no polyuria, no visual change, no weakness and no weight loss. There are no hypoglycemic complications. Symptoms are stable. There are no diabetic complications. Current diabetic treatment includes oral agent (monotherapy). He is compliant with treatment all of the time. His weight is stable. He is following a generally healthy diet. Meal planning includes avoidance of concentrated sweets. He has not had a previous visit with a dietician. He never participates in exercise. There is no change in his home blood glucose trend. An ACE inhibitor/angiotensin II receptor blocker is contraindicated. He does not see a podiatrist.Eye exam is current.      Review of Systems  Constitutional: Negative for fever, chills, weight loss, malaise/fatigue, diaphoresis, activity change, appetite change, fatigue and unexpected weight change.  HENT: Negative.   Eyes: Negative.  Negative for blurred vision.  Respiratory: Negative for cough, chest tightness, shortness of breath, wheezing and stridor.   Cardiovascular: Negative for chest pain, palpitations and leg swelling.  Gastrointestinal: Negative for nausea, vomiting, abdominal pain, diarrhea, constipation,  blood in stool, melena, hematochezia, abdominal distention, anal bleeding, anorexia and hematemesis.  Genitourinary: Negative.  Negative for polyuria.  Musculoskeletal: Negative.   Skin: Negative for color change, pallor, rash and wound.  Neurological: Negative.  Negative for dizziness, weakness, light-headedness and paresthesias.  Hematological: Negative for polydipsia, polyphagia and adenopathy. Does not bruise/bleed easily.  Psychiatric/Behavioral: Negative.  Negative for confusion.       Objective:   Physical Exam  Vitals reviewed. Constitutional: He is oriented to person, place, and time. He appears well-developed and well-nourished. No distress.  HENT:  Head: Normocephalic and atraumatic.  Mouth/Throat: Oropharynx is clear and moist. No oropharyngeal exudate.  Eyes: Conjunctivae are normal. Right eye exhibits no discharge. Left eye exhibits no discharge. No scleral icterus.  Neck: Normal range of motion. Neck supple. No JVD present. No tracheal deviation present. No thyromegaly present.  Cardiovascular: Normal rate, regular rhythm and intact distal pulses.  Exam reveals no gallop and no friction rub.   Murmur heard. Pulmonary/Chest: Effort normal and breath sounds normal. No stridor. No respiratory distress. He has no wheezes. He has no rales. He exhibits no tenderness.  Abdominal: Soft. Bowel sounds are normal. He exhibits no distension and no mass. There is no tenderness. There is no rebound and no guarding.  Musculoskeletal: Normal range of motion. He exhibits no edema and no tenderness.  Lymphadenopathy:    He has no cervical adenopathy.  Neurological: He is oriented to person, place, and time.  Skin: Skin is warm and dry. No rash noted. He is not diaphoretic. No erythema. No pallor.  Psychiatric: He has a normal mood and affect. His behavior is normal. Judgment and thought content normal.      Lab Results  Component Value Date   WBC 7.9 05/31/2011   HGB 10.8* 05/31/2011  HCT 33.0* 05/31/2011   PLT 238.0 05/31/2011   GLUCOSE 186* 05/31/2011   CHOL 159 04/03/2011   TRIG 117.0 04/03/2011   HDL 47.50 04/03/2011   LDLCALC 88 04/03/2011   ALT 25 04/03/2011   AST 20 04/03/2011   NA 139 05/31/2011   K 4.0 05/31/2011   CL 103 05/31/2011   CREATININE 1.1 05/31/2011   BUN 15 05/31/2011   CO2 29 05/31/2011   TSH 1.41 04/03/2011   PSA <0.01 ng/mL* 11/09/2008   INR 4.4 RATIO* 06/25/2008   HGBA1C 6.9* 04/03/2011   MICROALBUR 0.4 02/08/2010      Assessment & Plan:

## 2011-08-10 NOTE — Assessment & Plan Note (Signed)
This has resolved.

## 2011-08-31 ENCOUNTER — Ambulatory Visit (INDEPENDENT_AMBULATORY_CARE_PROVIDER_SITE_OTHER): Payer: Medicare Other | Admitting: Cardiology

## 2011-08-31 ENCOUNTER — Encounter: Payer: Self-pay | Admitting: Cardiology

## 2011-08-31 VITALS — BP 130/74 | HR 68 | Ht 69.0 in | Wt 211.0 lb

## 2011-08-31 DIAGNOSIS — E1129 Type 2 diabetes mellitus with other diabetic kidney complication: Secondary | ICD-10-CM

## 2011-08-31 DIAGNOSIS — I509 Heart failure, unspecified: Secondary | ICD-10-CM

## 2011-08-31 DIAGNOSIS — I472 Ventricular tachycardia, unspecified: Secondary | ICD-10-CM

## 2011-08-31 DIAGNOSIS — N058 Unspecified nephritic syndrome with other morphologic changes: Secondary | ICD-10-CM

## 2011-08-31 DIAGNOSIS — E1165 Type 2 diabetes mellitus with hyperglycemia: Secondary | ICD-10-CM

## 2011-08-31 DIAGNOSIS — I10 Essential (primary) hypertension: Secondary | ICD-10-CM

## 2011-08-31 DIAGNOSIS — Z95810 Presence of automatic (implantable) cardiac defibrillator: Secondary | ICD-10-CM

## 2011-08-31 DIAGNOSIS — N182 Chronic kidney disease, stage 2 (mild): Secondary | ICD-10-CM

## 2011-08-31 DIAGNOSIS — E785 Hyperlipidemia, unspecified: Secondary | ICD-10-CM

## 2011-08-31 DIAGNOSIS — I5022 Chronic systolic (congestive) heart failure: Secondary | ICD-10-CM

## 2011-08-31 MED ORDER — SPIRONOLACTONE 25 MG PO TABS: 25.0000 mg | ORAL_TABLET | Freq: Every day | ORAL | Status: DC

## 2011-08-31 NOTE — Patient Instructions (Addendum)
Your physician wants you to follow-up in:  6 months. You will receive a reminder letter in the mail two months in advance. If you don't receive a letter, please call our office to schedule the follow-up appointment.   

## 2011-08-31 NOTE — Assessment & Plan Note (Addendum)
Stable. Continues to do remarkably well. .Return to the office in 6 months.

## 2011-08-31 NOTE — Progress Notes (Signed)
HPI [name] returns today for evaluation and management of his chronic systolic heart failure. His weight fluctuates a couple pounds a week but is not progressively increased more than 3 pounds. He denies orthopnea, PND or edema. He does have stable dyspnea on exertion. He denies any chest pain, palpitations or any shock from his defibrillator.  He became hyperkalemic on spironolactone. This was discontinued. Other meds unchanged.  Past Medical History  Diagnosis Date  . Cardiac defibrillator in place   . Obesity   . Prostate cancer   . Glucose intolerance (impaired glucose tolerance)   . Bursitis of elbow     left  . Osteoarthritis   . HTN (hypertension)   . HLD (hyperlipidemia)   . History of DVT (deep vein thrombosis)   . CHF (congestive heart failure)   . Other primary cardiomyopathies     Current Outpatient Prescriptions  Medication Sig Dispense Refill  . aspirin 81 MG tablet Take 81 mg by mouth daily.        . carvedilol (COREG) 25 MG tablet Take 25 mg by mouth 2 (two) times daily with a meal.        . colchicine 0.6 MG tablet Take 1 tablet (0.6 mg total) by mouth 2 (two) times daily.  60 tablet  2  . digoxin (LANOXIN) 0.125 MG tablet Take 125 mcg by mouth daily.        . Ferrous Sulfate Dried (FEOSOL) 200 (65 FE) MG TABS Take by mouth 2 (two) times daily with a meal.        . furosemide (LASIX) 20 MG tablet Take 20 mg by mouth daily.        Marland Kitchen PRECISION XTRA TEST STRIPS test strip as directed.      . simvastatin (ZOCOR) 20 MG tablet Take 10 mg by mouth at bedtime.        . TRADJENTA 5 MG TABS tablet TAKE 1 TABLET BY MOUTH DAILY FOR DIABETES  30 tablet  5    No Known Allergies  Family History  Problem Relation Age of Onset  . Cancer Father     prostate  . Heart disease Mother     History   Social History  . Marital Status: Married    Spouse Name: N/A    Number of Children: N/A  . Years of Education: N/A   Occupational History  . Paramedic    Social  History Main Topics  . Smoking status: Former Games developer  . Smokeless tobacco: Not on file   Comment: quit over 30 years ago   . Alcohol Use: No     occasionally  . Drug Use: No  . Sexually Active: Not Currently   Other Topics Concern  . Not on file   Social History Narrative  . No narrative on file    ROS ALL NEGATIVE EXCEPT THOSE NOTED IN HPI  PE  General Appearance: well developed, well nourished in no acute distress, obese HEENT: symmetrical face, PERRLA, good dentition  Neck: no JVD, thyromegaly, or adenopathy, trachea midline Chest: symmetric without deformity Cardiac: PMI non-displaced, RRR, normal S1, S2, no gallop or murmur Lung: clear to ausculation and percussion Vascular: all pulses full without bruits  Abdominal: nondistended, nontender, good bowel sounds, no HSM, no bruits Extremities: no cyanosis, clubbing or edema, no sign of DVT, no varicosities  Skin: normal color, no rashes Neuro: alert and oriented x 3, non-focal Pysch: normal affect  EKG  BMET    Component Value Date/Time  NA 139 05/31/2011 1129   K 4.0 05/31/2011 1129   CL 103 05/31/2011 1129   CO2 29 05/31/2011 1129   GLUCOSE 186* 05/31/2011 1129   BUN 15 05/31/2011 1129   CREATININE 1.1 05/31/2011 1129   CALCIUM 8.8 05/31/2011 1129   GFRNONAA 69.86 04/26/2010 1041   GFRAA  Value: 57        The eGFR has been calculated using the MDRD equation. This calculation has not been validated in all clinical situations. eGFR's persistently <60 mL/min signify possible Chronic Kidney Disease.* 06/22/2008 0527    Lipid Panel     Component Value Date/Time   CHOL 159 04/03/2011 1328   TRIG 117.0 04/03/2011 1328   HDL 47.50 04/03/2011 1328   CHOLHDL 3 04/03/2011 1328   VLDL 23.4 04/03/2011 1328   LDLCALC 88 04/03/2011 1328    CBC    Component Value Date/Time   WBC 5.2 08/10/2011 1535   RBC 4.34 08/10/2011 1535   HGB 11.7* 08/10/2011 1535   HCT 35.7* 08/10/2011 1535   PLT 158.0 08/10/2011 1535   MCV  82.3 08/10/2011 1535   MCHC 32.7 08/10/2011 1535   RDW 17.6* 08/10/2011 1535   LYMPHSABS 1.3 08/10/2011 1535   MONOABS 0.6 08/10/2011 1535   EOSABS 0.1 08/10/2011 1535   BASOSABS 0.1 08/10/2011 1535

## 2011-09-14 ENCOUNTER — Ambulatory Visit (INDEPENDENT_AMBULATORY_CARE_PROVIDER_SITE_OTHER): Payer: Medicare Other | Admitting: Internal Medicine

## 2011-09-14 ENCOUNTER — Encounter: Payer: Self-pay | Admitting: Internal Medicine

## 2011-09-14 VITALS — BP 126/80 | HR 90 | Resp 18 | Ht 69.0 in | Wt 214.8 lb

## 2011-09-14 DIAGNOSIS — I5023 Acute on chronic systolic (congestive) heart failure: Secondary | ICD-10-CM | POA: Insufficient documentation

## 2011-09-14 DIAGNOSIS — N182 Chronic kidney disease, stage 2 (mild): Secondary | ICD-10-CM

## 2011-09-14 DIAGNOSIS — I10 Essential (primary) hypertension: Secondary | ICD-10-CM

## 2011-09-14 DIAGNOSIS — I472 Ventricular tachycardia: Secondary | ICD-10-CM

## 2011-09-14 LAB — ICD DEVICE OBSERVATION
BATTERY VOLTAGE: 3.1133 V
BRDY-0002RV: 40 {beats}/min
CHARGE TIME: 10.6 s
DEVICE MODEL ICD: 665247
HV IMPEDENCE: 41 Ohm
RV LEAD AMPLITUDE: 12 mv
RV LEAD IMPEDENCE ICD: 337.5 Ohm
RV LEAD THRESHOLD: 1.25 V
TOT-0010: 18
VF: 1

## 2011-09-14 MED ORDER — FUROSEMIDE 40 MG PO TABS: ORAL_TABLET | ORAL | Status: DC

## 2011-09-14 NOTE — Patient Instructions (Addendum)
Your physician recommends that you schedule a follow-up appointment in: 2 weeks with Dr Daleen Squibb or Lilian Coma   Your physician has requested that you have an echocardiogram. Echocardiography is a painless test that uses sound waves to create images of your heart. It provides your doctor with information about the size and shape of your heart and how well your heart's chambers and valves are working. This procedure takes approximately one hour. There are no restrictions for this procedure.  Your physician has recommended you make the following change in your medication:  1)Increase Furosemide to 40mg  in the morning and 20mg  in the pm for 3 days then go back to 40mg  daily  Take 2 of the 20mg  tablets in am and 1 in pm for 3 days then go back to 2 of the 20mg  tablets until run out  Pick up new prescription when you run out of 20mg  tablets  The new prescription will be a 40mg  tablet   Your physician recommends that you return for lab work today:  BMP

## 2011-09-14 NOTE — Assessment & Plan Note (Signed)
bmet today Increase lasix as above

## 2011-09-14 NOTE — Progress Notes (Signed)
PCP:  Sanda Linger, MD, MD Primary Cardiologist:  Dr Daleen Squibb  The patient presents today for routine electrophysiology followup.  Since last being seen in our clinic, the patient reports doing reasonably well.   Over the past few weeks, he has developed worsening SOB with activity.  He finds that with 1 flight of stairs he is quite dypsneic.   He has gained 3 lbs since his recent visit to Dr Daleen Squibb.  Today, he denies symptoms of palpitations, chest pain,  orthopnea, PND, lower extremity edema, dizziness, presyncope, syncope, or neurologic sequela.  The patient feels that he is tolerating medications without difficulties and is otherwise without complaint today.   Past Medical History  Diagnosis Date  . Cardiac defibrillator in place   . Obesity   . Prostate cancer   . Glucose intolerance (impaired glucose tolerance)   . Bursitis of elbow     left  . Osteoarthritis   . HTN (hypertension)   . HLD (hyperlipidemia)   . History of DVT (deep vein thrombosis)   . CHF (congestive heart failure)   . Other primary cardiomyopathies   . Chronic systolic dysfunction of left ventricle    Past Surgical History  Procedure Date  . Transurethral resection of prostate   . Cardiac defibrillator placement 10/09  . Hernia repair   . Robot assisted laparoscopic radical prostatectomy 6/08    Current Outpatient Prescriptions  Medication Sig Dispense Refill  . aspirin 81 MG tablet Take 81 mg by mouth daily.        . carvedilol (COREG) 25 MG tablet Take 25 mg by mouth 2 (two) times daily with a meal.        . colchicine 0.6 MG tablet Take 1 tablet (0.6 mg total) by mouth 2 (two) times daily.  60 tablet  2  . digoxin (LANOXIN) 0.125 MG tablet Take 125 mcg by mouth daily.        . Ferrous Sulfate Dried (FEOSOL) 200 (65 FE) MG TABS Take by mouth 2 (two) times daily with a meal.        . furosemide (LASIX) 40 MG tablet Take daily as directed  45 tablet  11  . PRECISION XTRA TEST STRIPS test strip as directed.        . simvastatin (ZOCOR) 20 MG tablet Take 10 mg by mouth at bedtime.        . TRADJENTA 5 MG TABS tablet TAKE 1 TABLET BY MOUTH DAILY FOR DIABETES  30 tablet  5  . DISCONTD: furosemide (LASIX) 20 MG tablet Take 20 mg by mouth daily.        Marland Kitchen DISCONTD: furosemide (LASIX) 40 MG tablet Take daily as directed  75 tablet  11    Allergies  Allergen Reactions  . Spironolactone     History   Social History  . Marital Status: Married    Spouse Name: N/A    Number of Children: N/A  . Years of Education: N/A   Occupational History  . Paramedic    Social History Main Topics  . Smoking status: Former Games developer  . Smokeless tobacco: Not on file   Comment: quit over 30 years ago   . Alcohol Use: No     occasionally  . Drug Use: No  . Sexually Active: Not Currently   Other Topics Concern  . Not on file   Social History Narrative  . No narrative on file    Family History  Problem Relation Age of Onset  .  Cancer Father     prostate  . Heart disease Mother     Physical Exam: Filed Vitals:   09/14/11 1120  BP: 126/80  Pulse: 90  Resp: 18  Height: 5\' 9"  (1.753 m)  Weight: 214 lb 12 oz (97.41 kg)    GEN- The patient is well appearing, alert and oriented x 3 today.   Head- normocephalic, atraumatic Eyes-  Sclera clear, conjunctiva pink Ears- hearing intact Oropharynx- clear Neck- supple, JVP 11cm Lymph- no cervical lymphadenopathy Lungs- few bibasilar rales Chest- ICD pocket is well healed Heart- Regular rate and rhythm, no murmurs, rubs or gallops, PMI not laterally displaced GI- soft, NT, ND, + BS Extremities- no clubbing, cyanosis, or edema  ICD interrogation- reviewed in detail today,  See PACEART report  Assessment and Plan:

## 2011-09-14 NOTE — Assessment & Plan Note (Signed)
Stable No change required today  

## 2011-09-14 NOTE — Assessment & Plan Note (Signed)
Weight is up and he is wet on exam Will increase lasix to 40mg  daily bmet today Repeat echo as it has been several years since his last echo Follow-up with Dr Daleen Squibb or Tereso Newcomer in 2 weeks   Normal ICD function See Arita Miss Art report No changes today

## 2011-09-20 ENCOUNTER — Other Ambulatory Visit: Payer: Self-pay | Admitting: Internal Medicine

## 2011-09-26 ENCOUNTER — Ambulatory Visit (HOSPITAL_COMMUNITY): Payer: Medicare Other | Attending: Internal Medicine

## 2011-09-26 ENCOUNTER — Other Ambulatory Visit: Payer: Self-pay

## 2011-09-26 DIAGNOSIS — I059 Rheumatic mitral valve disease, unspecified: Secondary | ICD-10-CM | POA: Insufficient documentation

## 2011-09-26 DIAGNOSIS — R5381 Other malaise: Secondary | ICD-10-CM | POA: Insufficient documentation

## 2011-09-26 DIAGNOSIS — R0989 Other specified symptoms and signs involving the circulatory and respiratory systems: Secondary | ICD-10-CM | POA: Insufficient documentation

## 2011-09-26 DIAGNOSIS — R0602 Shortness of breath: Secondary | ICD-10-CM | POA: Insufficient documentation

## 2011-09-26 DIAGNOSIS — I10 Essential (primary) hypertension: Secondary | ICD-10-CM | POA: Insufficient documentation

## 2011-09-26 DIAGNOSIS — I4729 Other ventricular tachycardia: Secondary | ICD-10-CM | POA: Insufficient documentation

## 2011-09-26 DIAGNOSIS — I509 Heart failure, unspecified: Secondary | ICD-10-CM | POA: Insufficient documentation

## 2011-09-26 DIAGNOSIS — E669 Obesity, unspecified: Secondary | ICD-10-CM | POA: Insufficient documentation

## 2011-09-26 DIAGNOSIS — I428 Other cardiomyopathies: Secondary | ICD-10-CM | POA: Insufficient documentation

## 2011-09-26 DIAGNOSIS — Z87891 Personal history of nicotine dependence: Secondary | ICD-10-CM | POA: Insufficient documentation

## 2011-09-26 DIAGNOSIS — I472 Ventricular tachycardia, unspecified: Secondary | ICD-10-CM | POA: Insufficient documentation

## 2011-09-26 DIAGNOSIS — E785 Hyperlipidemia, unspecified: Secondary | ICD-10-CM | POA: Insufficient documentation

## 2011-09-26 DIAGNOSIS — N289 Disorder of kidney and ureter, unspecified: Secondary | ICD-10-CM | POA: Insufficient documentation

## 2011-09-26 DIAGNOSIS — Z86718 Personal history of other venous thrombosis and embolism: Secondary | ICD-10-CM | POA: Insufficient documentation

## 2011-09-26 DIAGNOSIS — R0601 Orthopnea: Secondary | ICD-10-CM | POA: Insufficient documentation

## 2011-09-26 DIAGNOSIS — R0609 Other forms of dyspnea: Secondary | ICD-10-CM | POA: Insufficient documentation

## 2011-09-27 ENCOUNTER — Encounter: Payer: Self-pay | Admitting: Physician Assistant

## 2011-09-27 ENCOUNTER — Ambulatory Visit (INDEPENDENT_AMBULATORY_CARE_PROVIDER_SITE_OTHER): Payer: Medicare Other | Admitting: Physician Assistant

## 2011-09-27 VITALS — BP 118/78 | HR 93 | Ht 69.0 in | Wt 208.8 lb

## 2011-09-27 DIAGNOSIS — Z95810 Presence of automatic (implantable) cardiac defibrillator: Secondary | ICD-10-CM

## 2011-09-27 DIAGNOSIS — I340 Nonrheumatic mitral (valve) insufficiency: Secondary | ICD-10-CM

## 2011-09-27 DIAGNOSIS — I5022 Chronic systolic (congestive) heart failure: Secondary | ICD-10-CM

## 2011-09-27 DIAGNOSIS — I059 Rheumatic mitral valve disease, unspecified: Secondary | ICD-10-CM

## 2011-09-27 DIAGNOSIS — I10 Essential (primary) hypertension: Secondary | ICD-10-CM

## 2011-09-27 LAB — BASIC METABOLIC PANEL
CO2: 27 mEq/L (ref 19–32)
Calcium: 9 mg/dL (ref 8.4–10.5)
Creatinine, Ser: 1.2 mg/dL (ref 0.4–1.5)
Glucose, Bld: 106 mg/dL — ABNORMAL HIGH (ref 70–99)

## 2011-09-27 NOTE — Progress Notes (Signed)
[address] [place], [place]  [address] Phone: [phone] Fax:  [phone]  Date:  09/27/2011   Name:  [name]       DOB:  19-Aug-1940 MRN:  [id number]  PCP:  Dr. Sanda Linger Primary Cardiologist:  Dr. Valera Castle  Primary Electrophysiologist:  Dr. Hillis Range    History of Present Illness: [name] is a 71 y.o. male who returns for follow up.  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  Echocardiogram 06/2008: EF 25-30%, mild MR.  Nuclear study 8/08: no ischemia, inf/lat/dAnt/apical scar.  He saw Dr. Johney Frame 3/28 in followup.  He noted increased dyspnea with exertion.  He was felt to be volume overloaded.  His Lasix was increased.  A followup echocardiogram was arranged.  2-D echocardiogram 09/26/11: Study Conclusions  - Left ventricle: The cavity size was moderately dilated. Wall thickness was normal. Systolic function was severely reduced. The estimated ejection fraction was in the range of 25% to 30%. Diffuse hypokinesis. Left ventricular diastolic function parameters were normal. - Mitral valve: Moderate regurgitation. - Left atrium: The atrium was moderately dilated. - Pulmonary arteries: Systolic pressure was mildly to moderately increased. PA peak pressure: 47mm Hg (S).  He feels better.  His weight is down 6 pounds.  His breathing is improved.  He describes class II symptoms.  He denies orthopnea, PND or significant pedal edema.  He denies chest pain.  Past Medical History  Diagnosis Date  . AICD (automatic cardioverter/defibrillator) present   . Obesity   . Prostate cancer   . Glucose intolerance (impaired glucose tolerance)   . Bursitis of elbow     left  . Osteoarthritis   . HTN (hypertension)   . HLD (hyperlipidemia)   . History of DVT  (deep vein thrombosis)   . Chronic systolic heart failure     echo 4/13: EF 25-30%, mod MR, mod LAE, PASP 47  . NICM (nonischemic cardiomyopathy)     normal cors by cath in 1990s;  nuclear 8/08: no ischemia; inf/lat/dist ant/apical scar    Current Outpatient Prescriptions  Medication Sig Dispense Refill  . aspirin 81 MG tablet Take 81 mg by mouth daily.        . carvedilol (COREG) 25 MG tablet Take 25 mg by mouth 2 (two) times daily with a meal.        . COLCRYS 0.6 MG tablet TAKE 1 TABLET (0.6 MG TOTAL) BY MOUTH 2 (TWO) TIMES DAILY.  60 tablet  2  . digoxin (LANOXIN) 0.125 MG tablet Take 125 mcg by mouth daily.        . Ferrous Sulfate Dried (FEOSOL) 200 (65 FE) MG TABS Take by mouth 2 (two) times daily with a meal.        . furosemide (LASIX) 40 MG tablet Take 40 mg by mouth daily. Take daily as directed      . PRECISION XTRA TEST STRIPS test strip as directed.      . simvastatin (ZOCOR) 20 MG tablet Take 10 mg by mouth at bedtime.        . TRADJENTA 5 MG TABS tablet TAKE 1 TABLET BY MOUTH DAILY FOR DIABETES  30 tablet  5  . DISCONTD: furosemide (LASIX) 40 MG tablet Take daily as directed  45 tablet  11    Allergies: Allergies  Allergen  Reactions  . Spironolactone     History  Substance Use Topics  . Smoking status: Former Games developer  . Smokeless tobacco: Not on file   Comment: quit over 30 years ago   . Alcohol Use: No     occasionally     ROS:  Please see the history of present illness.   All other systems reviewed and negative.   PHYSICAL EXAM: VS:  BP 118/78  Pulse 93  Ht 5\' 9"  (1.753 m)  Wt 208 lb 12.8 oz (94.711 kg)  BMI 30.83 kg/m2 Well nourished, well developed, in no acute distress HEENT: normal Neck: no JVD Cardiac:  normal S1, S2; RRR; 2/6 holosystolic murmur LLSB Lungs:  clear to auscultation bilaterally, no wheezing, rhonchi or rales Abd: soft, nontender, no hepatomegaly Ext: no edema Skin: warm and dry Neuro:  CNs 2-12 intact, no focal abnormalities  noted  EKG:  Sinus rhythm, heart rate 93, normal axis, nonspecific ST-T wave changes, no change from prior tracing  ASSESSMENT AND PLAN:  1. Chronic systolic heart failure secondary to non-ischemic cardiomyopathy Symptoms improved.  His weight is down 6 pounds.  His echocardiogram demonstrates stable EF of 25-30%.  His mitral regurgitation is now moderate.  He is off of his ACE inhibitor and spironolactone.  I reviewed his chart.  He had increasing creatinine and potassium with both of these medications and they were discontinued.  I will check a followup basic metabolic panel today.  If his renal function and potassium are stable, I will likely try to get him back on a low dose ACE inhibitor.  If he has recurrent problems with worsening renal function potassium, we could consider placing him on low dose hydralazine and nitrates.  He can follow up with me in 4-6 weeks.  He will keep his follow up with Dr. Daleen Squibb in 5-6 months.   2. Mitral regurgitation  Continue medical therapy.   3. HYPERTENSION  Controlled.  Continue current therapy.    4. AUTOMATIC IMPLANTABLE CARDIAC DEFIBRILLATOR SITU  Continue follow up with Dr. Hillis Range.       Signed, Tereso Newcomer, PA-C  2:45 PM 09/27/2011

## 2011-09-27 NOTE — Patient Instructions (Addendum)
Your physician recommends that you schedule a follow-up appointment in: 4-6 weeks Your physician recommends that you have lab work drawn today (BMP) Please weigh yourself daily and let us know if you gain 3 lbs in 1 day or 5 lbs in 1 week

## 2011-09-29 ENCOUNTER — Telehealth: Payer: Self-pay | Admitting: *Deleted

## 2011-09-29 DIAGNOSIS — I5023 Acute on chronic systolic (congestive) heart failure: Secondary | ICD-10-CM

## 2011-09-29 MED ORDER — LISINOPRIL 5 MG PO TABS: 5.0000 mg | ORAL_TABLET | Freq: Every day | ORAL | Status: DC

## 2011-09-29 NOTE — Telephone Encounter (Signed)
pt notified of lab results and to start on lisiniopril 5 mg daily, rx called in today, pt aware repeat bmet 4/19. Danielle Rankin

## 2011-09-29 NOTE — Telephone Encounter (Signed)
Message copied by Tarri Fuller on Fri Sep 29, 2011 11:10 AM ------      Message from: [place], [place] T      Created: Fri Sep 29, 2011  8:16 AM       Creatinine and K+ are good      I would like to try to get him back on an ACE inhibitor      Start Lisinopril 5 mg QD      He needs close follow up on renal fxn and K+        -  Schedule f/u BMET 5-7 days after starting Lisinopril      Tereso Newcomer, PA-C  8:16 AM 09/29/2011

## 2011-10-06 ENCOUNTER — Ambulatory Visit: Payer: Medicare Other | Admitting: *Deleted

## 2011-10-06 DIAGNOSIS — I10 Essential (primary) hypertension: Secondary | ICD-10-CM

## 2011-10-06 LAB — BASIC METABOLIC PANEL
BUN: 29 mg/dL — ABNORMAL HIGH (ref 6–23)
Chloride: 107 mEq/L (ref 96–112)
Creat: 1.38 mg/dL — ABNORMAL HIGH (ref 0.50–1.35)
Glucose, Bld: 99 mg/dL (ref 70–99)

## 2011-10-09 ENCOUNTER — Other Ambulatory Visit: Payer: Self-pay | Admitting: *Deleted

## 2011-10-09 ENCOUNTER — Telehealth: Payer: Self-pay | Admitting: *Deleted

## 2011-10-09 DIAGNOSIS — I5023 Acute on chronic systolic (congestive) heart failure: Secondary | ICD-10-CM

## 2011-10-09 NOTE — Telephone Encounter (Signed)
reached pt and he is now notified of lab results and will come in 5/6 repeat bmet. Danielle Rankin

## 2011-10-09 NOTE — Telephone Encounter (Signed)
no answer, I will try agin later to call pt and go over lab results and repeat bmet 2 weeks

## 2011-10-09 NOTE — Telephone Encounter (Signed)
Message copied by Tarri Fuller on Mon Oct 09, 2011  8:59 AM ------      Message from: [place], [place] T      Created: Mon Oct 09, 2011  8:35 AM       Stable      Repeat BMET in 2 weeks      Tereso Newcomer, [place]  8:35 AM 10/09/2011

## 2011-10-23 ENCOUNTER — Other Ambulatory Visit (INDEPENDENT_AMBULATORY_CARE_PROVIDER_SITE_OTHER): Payer: Medicare Other

## 2011-10-23 DIAGNOSIS — I5023 Acute on chronic systolic (congestive) heart failure: Secondary | ICD-10-CM

## 2011-10-24 ENCOUNTER — Telehealth: Payer: Self-pay | Admitting: *Deleted

## 2011-10-24 LAB — BASIC METABOLIC PANEL
BUN: 27 mg/dL — ABNORMAL HIGH (ref 6–23)
Calcium: 8.9 mg/dL (ref 8.4–10.5)
GFR: 63.37 mL/min (ref >60.00)
Potassium: 4 mEq/L (ref 3.5–5.1)
Sodium: 143 mEq/L (ref 135–145)

## 2011-10-24 NOTE — Telephone Encounter (Signed)
lmom labs ok 

## 2011-10-24 NOTE — Telephone Encounter (Signed)
Message copied by Tarri Fuller on Tue Oct 24, 2011  4:02 PM ------      Message from: [place], [place] T      Created: Tue Oct 24, 2011  1:13 PM       Stable creatinine.      K+ ok      Tereso Newcomer, PA-C  1:13 PM 10/24/2011

## 2011-11-01 ENCOUNTER — Encounter: Payer: Self-pay | Admitting: Physician Assistant

## 2011-11-01 ENCOUNTER — Ambulatory Visit (INDEPENDENT_AMBULATORY_CARE_PROVIDER_SITE_OTHER): Payer: Medicare Other | Admitting: Physician Assistant

## 2011-11-01 ENCOUNTER — Telehealth: Payer: Self-pay | Admitting: *Deleted

## 2011-11-01 VITALS — BP 102/60 | HR 80 | Ht 69.0 in | Wt 206.0 lb

## 2011-11-01 DIAGNOSIS — I10 Essential (primary) hypertension: Secondary | ICD-10-CM

## 2011-11-01 DIAGNOSIS — I5022 Chronic systolic (congestive) heart failure: Secondary | ICD-10-CM

## 2011-11-01 DIAGNOSIS — I5023 Acute on chronic systolic (congestive) heart failure: Secondary | ICD-10-CM

## 2011-11-01 DIAGNOSIS — I340 Nonrheumatic mitral (valve) insufficiency: Secondary | ICD-10-CM | POA: Insufficient documentation

## 2011-11-01 DIAGNOSIS — I059 Rheumatic mitral valve disease, unspecified: Secondary | ICD-10-CM

## 2011-11-01 LAB — BASIC METABOLIC PANEL
CO2: 25 mEq/L (ref 19–32)
Calcium: 9 mg/dL (ref 8.4–10.5)
Sodium: 141 mEq/L (ref 135–145)

## 2011-11-01 NOTE — Progress Notes (Signed)
[address] [place], [place]  [address] Phone: [phone] Fax:  [phone]  Date:  11/01/2011   Name:  [name]    DOB:  Jan 28, 1941   MRN:  [id number]  PCP:  Sanda Linger, MD, MD  Primary Cardiologist:  Dr. Valera Castle  Primary Electrophysiologist:  Dr. Hillis Range    History of Present Illness: [name] is a 71 y.o. male [place] veteran who returns for follow up.  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   Nuclear study 8/08: no ischemia, inf/lat/dAnt/apical scar.  He saw Dr. Johney Frame 09/14/11 in followup and was volume overloaded.  Meds were adjusted and a follow up echo obtained.  Echo 09/26/11:  EF 25-30%, mod MR, mod LAE, PASP 47.  I saw him in follow up 09/27/11 and weight was down 6 pounds with improved breathing.  He was off his ACE.  After chart review, it was discovered that this and his spironolactone were both d/c'd due to increasing creatinine and K+.  I put him back on his ACE and thus far his K+ and creatinine have remained stable.  Today, he is doing well.  His breathing remains improved.  He describes class II symptoms.  Denies chest pain, syncope, ICD discharge, orthopnea, PND or edema.  Tolerating the addition of lisinopril well.  Wt Readings from Last 3 Encounters:  11/01/11 206 lb (93.441 kg)  09/27/11 208 lb 12.8 oz (94.711 kg)  09/14/11 214 lb 12 oz (97.41 kg)     Potassium  Date/Time Value Range Status  10/23/2011  3:29 PM 4.0  3.5-5.1 (mEq/L) Final  10/06/2011  3:41 PM 4.3  3.5-5.3 (mEq/L) Final  09/27/2011  2:52 PM 3.7  3.5-5.1 (mEq/L) Final     Creat  Date/Time Value Range Status  10/06/2011  3:41 PM 1.38* 0.50-1.35 (mg/dL) Final     Creatinine, Ser  Date/Time Value Range Status  10/23/2011  3:29 PM 1.4  0.4-1.5 (mg/dL) Final    6/96/2952  8:41 PM 1.2  0.4-1.5 (mg/dL) Final  32/44/0102 72:53 AM 1.1  0.4-1.5 (mg/dL) Final    Past Medical History  Diagnosis Date  . AICD (automatic cardioverter/defibrillator) present   . Obesity   . Prostate cancer   . Glucose intolerance (impaired glucose tolerance)   . Bursitis of elbow     left  . Osteoarthritis   . HTN (hypertension)   . HLD (hyperlipidemia)   . History of DVT (deep vein thrombosis)   . Chronic systolic heart failure     echo 4/13: EF 25-30%, mod MR, mod LAE, PASP 47  . NICM (nonischemic cardiomyopathy)     normal cors by cath in 1990s;  nuclear 8/08: no ischemia; inf/lat/dist ant/apical scar    Current Outpatient Prescriptions  Medication Sig Dispense Refill  . aspirin 81 MG tablet Take 81 mg by mouth daily.        . carvedilol (COREG) 25 MG tablet Take 25 mg by mouth 2 (two) times daily with a meal.        . COLCRYS 0.6 MG tablet TAKE 1 TABLET (0.6 MG TOTAL) BY MOUTH 2 (TWO) TIMES DAILY.  60 tablet  2  . digoxin (LANOXIN) 0.125 MG tablet Take 125 mcg by mouth daily.        . Ferrous Sulfate Dried (FEOSOL) 200 (65 FE) MG TABS Take  by mouth 2 (two) times daily with a meal.        . furosemide (LASIX) 40 MG tablet Take 40 mg by mouth daily. Take daily as directed      . lisinopril (PRINIVIL,ZESTRIL) 5 MG tablet Take 1 tablet (5 mg total) by mouth daily.  30 tablet  11  . PRECISION XTRA TEST STRIPS test strip as directed.      . simvastatin (ZOCOR) 20 MG tablet Take 10 mg by mouth at bedtime.        . TRADJENTA 5 MG TABS tablet TAKE 1 TABLET BY MOUTH DAILY FOR DIABETES  30 tablet  5    Allergies: Allergies  Allergen Reactions  . Spironolactone     History  Substance Use Topics  . Smoking status: Former Smoker -- 0.5 packs/day for 10 years    Types: Cigarettes    Quit date: 11/01/1975  . Smokeless tobacco: Never Used   Comment: quit over 30 years ago   . Alcohol Use: No     occasionally     PHYSICAL EXAM: VS:  BP 102/60  Pulse 80  Ht  5\' 9"  (1.753 m)  Wt 206 lb (93.441 kg)  BMI 30.42 kg/m2 Well nourished, well developed, in no acute distress HEENT: normal Neck: no JVD Cardiac:  normal S1, S2; RRR; 2/6 holosystolic murmur LLSB Lungs:  clear to auscultation bilaterally, no wheezing, rhonchi or rales Abd: soft, nontender, no hepatomegaly Ext: no edema Skin: warm and dry Neuro:  CNs 2-12 intact, no focal abnormalities noted  EKG:  NSR, HR 80, normal axis, non-specific ST-T changes, PVC, no change from prior  ASSESSMENT AND PLAN:  1.  Chronic Systolic Congestive Heart Failure   -  Doing well.  Volume stable.  Continue current regimen.  Check BMET again today to keep close eye on renal fxn and K+.  Follow up with me in 2 mos and Dr. Valera Castle in Oct 2013.  2.  Moderate Mitral Regurgitation   -  Continue medical management.  3.  Hypertension   -  Controlled.  Continue current therapy.    Luna Glasgow, PA-C  10:25 AM 11/01/2011

## 2011-11-01 NOTE — Telephone Encounter (Signed)
pt notified of lab results today and will have repeat bmet 12/15/11

## 2011-11-01 NOTE — Telephone Encounter (Signed)
Message copied by Tarri Fuller on Wed Nov 01, 2011  5:16 PM ------      Message from: [place], [place] T      Created: Wed Nov 01, 2011  4:19 PM       Good      Repeat BMET in 6 weeks.      Tereso Newcomer, PA-C  4:19 PM 11/01/2011

## 2011-11-01 NOTE — Patient Instructions (Addendum)
Your physician recommends that you schedule a follow-up appointment in: 2 MONTHS WITH SCOTT WEAVER, PAC  PLEASE FOLLOW UP WITH DR. WALL IN October, 2013  BMET TODAY

## 2011-11-07 ENCOUNTER — Ambulatory Visit (INDEPENDENT_AMBULATORY_CARE_PROVIDER_SITE_OTHER): Payer: Medicare Other | Admitting: Internal Medicine

## 2011-11-07 ENCOUNTER — Encounter: Payer: Self-pay | Admitting: Internal Medicine

## 2011-11-07 ENCOUNTER — Other Ambulatory Visit (INDEPENDENT_AMBULATORY_CARE_PROVIDER_SITE_OTHER): Payer: Medicare Other

## 2011-11-07 VITALS — BP 106/72 | HR 81 | Temp 97.7°F | Resp 16 | Wt 206.8 lb

## 2011-11-07 DIAGNOSIS — E1129 Type 2 diabetes mellitus with other diabetic kidney complication: Secondary | ICD-10-CM

## 2011-11-07 DIAGNOSIS — N182 Chronic kidney disease, stage 2 (mild): Secondary | ICD-10-CM

## 2011-11-07 DIAGNOSIS — D649 Anemia, unspecified: Secondary | ICD-10-CM

## 2011-11-07 DIAGNOSIS — E1165 Type 2 diabetes mellitus with hyperglycemia: Secondary | ICD-10-CM

## 2011-11-07 DIAGNOSIS — N058 Unspecified nephritic syndrome with other morphologic changes: Secondary | ICD-10-CM

## 2011-11-07 LAB — CBC WITH DIFFERENTIAL/PLATELET
Basophils Absolute: 0 10*3/uL (ref 0.0–0.1)
Eosinophils Absolute: 0.1 10*3/uL (ref 0.0–0.7)
Lymphocytes Relative: 20.4 % (ref 12.0–46.0)
MCHC: 32.5 g/dL (ref 30.0–36.0)
MCV: 80.2 fl (ref 78.0–100.0)
Monocytes Absolute: 0.6 10*3/uL (ref 0.1–1.0)
Neutrophils Relative %: 64.7 % (ref 43.0–77.0)
Platelets: 172 10*3/uL (ref 150.0–400.0)
RDW: 17.1 % — ABNORMAL HIGH (ref 11.5–14.6)

## 2011-11-07 NOTE — Patient Instructions (Signed)

## 2011-11-07 NOTE — Progress Notes (Signed)
Subjective:    Patient ID: [name], male    DOB: 1941-06-01, 71 y.o.   MRN: [id number]  Diabetes He presents for his follow-up diabetic visit. He has type 2 diabetes mellitus. His disease course has been stable. There are no hypoglycemic associated symptoms. Pertinent negatives for hypoglycemia include no pallor. Pertinent negatives for diabetes include no blurred vision, no chest pain, no fatigue, no foot paresthesias, no foot ulcerations, no polydipsia, no polyphagia, no polyuria, no visual change, no weakness and no weight loss. There are no hypoglycemic complications. Diabetic complications include heart disease. Current diabetic treatment includes oral agent (monotherapy). He is compliant with treatment all of the time. His weight is stable. He is following a generally healthy diet. When asked about meal planning, he reported none. He participates in exercise intermittently. There is no change in his home blood glucose trend. His breakfast blood glucose range is generally 110-130 mg/dl. His lunch blood glucose range is generally 110-130 mg/dl. His dinner blood glucose range is generally 110-130 mg/dl. His highest blood glucose is 130-140 mg/dl. His overall blood glucose range is 110-130 mg/dl. An ACE inhibitor/angiotensin II receptor blocker is being taken. He does not see a podiatrist.Eye exam is current.      Review of Systems  Constitutional: Negative for fever, chills, weight loss, diaphoresis, activity change, appetite change, fatigue and unexpected weight change.  HENT: Negative.   Eyes: Negative.  Negative for blurred vision.  Respiratory: Negative for cough, chest tightness, shortness of breath, wheezing and stridor.   Cardiovascular: Negative for chest pain, palpitations and leg swelling.  Gastrointestinal: Negative for nausea, vomiting, abdominal pain, diarrhea, constipation and anal bleeding.  Genitourinary: Negative for dysuria, urgency, polyuria, frequency, hematuria, flank  pain, decreased urine volume, enuresis and difficulty urinating.  Musculoskeletal: Negative.  Negative for myalgias, back pain, joint swelling, arthralgias and gait problem.  Skin: Negative for color change, pallor, rash and wound.  Neurological: Negative.  Negative for weakness.  Hematological: Negative for polydipsia, polyphagia and adenopathy. Does not bruise/bleed easily.  Psychiatric/Behavioral: Negative.        Objective:   Physical Exam  Vitals reviewed. Constitutional: He is oriented to person, place, and time. He appears well-developed and well-nourished. No distress.  HENT:  Head: Normocephalic and atraumatic.  Mouth/Throat: Oropharynx is clear and moist. No oropharyngeal exudate.  Eyes: Conjunctivae are normal. Right eye exhibits no discharge. Left eye exhibits no discharge. No scleral icterus.  Neck: Normal range of motion. Neck supple. No JVD present. No tracheal deviation present. No thyromegaly present.  Cardiovascular: Normal rate, regular rhythm, normal heart sounds and intact distal pulses.  Exam reveals no gallop and no friction rub.   No murmur heard. Pulmonary/Chest: Effort normal and breath sounds normal. No stridor. No respiratory distress. He has no wheezes. He has no rales. He exhibits no tenderness.  Abdominal: Soft. Bowel sounds are normal. He exhibits no distension and no mass. There is no tenderness. There is no rebound and no guarding.  Musculoskeletal: Normal range of motion. He exhibits no edema and no tenderness.  Lymphadenopathy:    He has no cervical adenopathy.  Neurological: He is oriented to person, place, and time.  Skin: Skin is warm and dry. No rash noted. He is not diaphoretic. No erythema. No pallor.  Psychiatric: He has a normal mood and affect. His behavior is normal. Judgment and thought content normal.      Lab Results  Component Value Date   WBC 5.2 08/10/2011   HGB 11.7* 08/10/2011  HCT 35.7* 08/10/2011   PLT 158.0 08/10/2011    GLUCOSE 112* 11/01/2011   CHOL 159 04/03/2011   TRIG 117.0 04/03/2011   HDL 47.50 04/03/2011   LDLCALC 88 04/03/2011   ALT 25 04/03/2011   AST 20 04/03/2011   NA 141 11/01/2011   K 4.4 11/01/2011   CL 108 11/01/2011   CREATININE 1.2 11/01/2011   BUN 26* 11/01/2011   CO2 25 11/01/2011   TSH 1.41 04/03/2011   PSA <0.01 ng/mL* 11/09/2008   INR 4.4 RATIO* 06/25/2008   HGBA1C 6.0 08/10/2011   MICROALBUR 14.1* 08/10/2011      Assessment & Plan:

## 2011-11-09 NOTE — Assessment & Plan Note (Signed)
His recent labs look real good

## 2011-11-09 NOTE — Assessment & Plan Note (Signed)
I will check his a1c today to see if his blood sugar has been well controlled and I will monitor his renal function

## 2011-11-09 NOTE — Assessment & Plan Note (Signed)
I will recheck his CBC today 

## 2011-12-15 ENCOUNTER — Ambulatory Visit (INDEPENDENT_AMBULATORY_CARE_PROVIDER_SITE_OTHER): Payer: Medicare Other | Admitting: *Deleted

## 2011-12-15 DIAGNOSIS — E785 Hyperlipidemia, unspecified: Secondary | ICD-10-CM

## 2011-12-15 DIAGNOSIS — I10 Essential (primary) hypertension: Secondary | ICD-10-CM

## 2011-12-16 LAB — BASIC METABOLIC PANEL
Calcium: 9.2 mg/dL (ref 8.4–10.5)
Glucose, Bld: 96 mg/dL (ref 70–99)
Potassium: 4.1 mEq/L (ref 3.5–5.3)
Sodium: 143 mEq/L (ref 135–145)

## 2011-12-20 ENCOUNTER — Telehealth: Payer: Self-pay | Admitting: *Deleted

## 2011-12-20 DIAGNOSIS — I5022 Chronic systolic (congestive) heart failure: Secondary | ICD-10-CM

## 2011-12-20 NOTE — Telephone Encounter (Signed)
pt notified of lab results and will have repeat bmet 7/24, sees SW pa 7/15

## 2011-12-22 ENCOUNTER — Encounter: Payer: Medicare Other | Admitting: *Deleted

## 2011-12-24 ENCOUNTER — Other Ambulatory Visit: Payer: Self-pay | Admitting: Internal Medicine

## 2011-12-29 ENCOUNTER — Encounter: Payer: Self-pay | Admitting: *Deleted

## 2011-12-30 ENCOUNTER — Encounter: Payer: Self-pay | Admitting: Physician Assistant

## 2012-01-01 ENCOUNTER — Ambulatory Visit (INDEPENDENT_AMBULATORY_CARE_PROVIDER_SITE_OTHER): Payer: Medicare Other | Admitting: Physician Assistant

## 2012-01-01 ENCOUNTER — Ambulatory Visit: Payer: Medicare Other | Admitting: Physician Assistant

## 2012-01-01 ENCOUNTER — Encounter: Payer: Self-pay | Admitting: Physician Assistant

## 2012-01-01 VITALS — BP 109/75 | HR 94 | Ht 69.0 in | Wt 203.0 lb

## 2012-01-01 DIAGNOSIS — I059 Rheumatic mitral valve disease, unspecified: Secondary | ICD-10-CM

## 2012-01-01 DIAGNOSIS — I10 Essential (primary) hypertension: Secondary | ICD-10-CM

## 2012-01-01 DIAGNOSIS — I509 Heart failure, unspecified: Secondary | ICD-10-CM

## 2012-01-01 DIAGNOSIS — I340 Nonrheumatic mitral (valve) insufficiency: Secondary | ICD-10-CM

## 2012-01-01 DIAGNOSIS — I5022 Chronic systolic (congestive) heart failure: Secondary | ICD-10-CM

## 2012-01-01 DIAGNOSIS — I472 Ventricular tachycardia: Secondary | ICD-10-CM

## 2012-01-01 LAB — BASIC METABOLIC PANEL
CO2: 25 mEq/L (ref 19–32)
Calcium: 9.2 mg/dL (ref 8.4–10.5)
GFR: 69.51 mL/min (ref >60.00)
Glucose, Bld: 117 mg/dL — ABNORMAL HIGH (ref 70–99)
Potassium: 4.3 mEq/L (ref 3.5–5.1)
Sodium: 142 mEq/L (ref 135–145)

## 2012-01-01 NOTE — Progress Notes (Signed)
[address] [place], [place]  [address] Phone: [phone] Fax:  [phone]  Date:  01/01/2012   Name:  [name]    DOB:  1940-06-26   MRN:  [id number]  PCP:  Sanda Linger, MD  Primary Cardiologist:  Dr. Valera Castle  Primary Electrophysiologist:  Dr. Hillis Range    History of Present Illness: [name] is a 71 y.o. male [place] veteran who returns for follow up.  He has a history of systolic CHF secondary to NICM, normal coronary arteries by cardiac catheterization in the 1990s, s/p AICD, prostate cancer, glucose intolerance, HTN, HL, prior DVT.   Nuclear study 8/08: no ischemia, inf/lat/dAnt/apical scar.  He saw Dr. Johney Frame 09/14/11 in followup and was volume overloaded.  Meds were adjusted and a follow up echo obtained.  Echo 09/26/11:  EF 25-30%, mod MR, mod LAE, PASP 47.  I last saw him in follow up 11/01/11.  I put him back on his ACE.  I have been following his renal fxn and K+ since and it has been stable.  Today, he is doing well.  Denies chest pain, syncope, near syncope, orthopnea, PND, edema.  He has some dyspnea with more extreme activities.  Otherwise, he denies significant DOE.  Wt Readings from Last 3 Encounters:  01/01/12 203 lb (92.08 kg)  11/07/11 206 lb 12 oz (93.781 kg)  11/01/11 206 lb (93.441 kg)    Potassium  Date/Time Value Range Status  12/15/2011  4:21 PM 4.1  3.5 - 5.3 mEq/L Final  11/01/2011 10:50 AM 4.4  3.5 - 5.1 mEq/L Final  10/23/2011  3:29 PM 4.0  3.5 - 5.1 mEq/L Final   Creat  Date/Time Value Range Status  12/15/2011  4:21 PM 1.48* 0.50 - 1.35 mg/dL Final  8/41/3244  0:10 PM 1.38* 0.50 - 1.35 mg/dL Final   Creatinine, Ser  Date/Time Value Range Status  11/01/2011 10:50 AM 1.2  0.4 - 1.5 mg/dL Final  07/26/2534  6:44 PM 1.4  0.4 - 1.5 mg/dL Final  0/34/7425  9:56 PM 1.2  0.4 - 1.5 mg/dL Final    Past Medical History  Diagnosis Date  . AICD (automatic cardioverter/defibrillator) present   . Obesity   . Prostate  cancer   . Glucose intolerance (impaired glucose tolerance)   . Bursitis of elbow     left  . Osteoarthritis   . HTN (hypertension)   . HLD (hyperlipidemia)   . History of DVT (deep vein thrombosis)   . Chronic systolic heart failure     echo 4/13: EF 25-30%, mod MR, mod LAE, PASP 47  . NICM (nonischemic cardiomyopathy)     normal cors by cath in 1990s;  nuclear 8/08: no ischemia; inf/lat/dist ant/apical scar  . Moderate mitral regurgitation     echo 4/13    Current Outpatient Prescriptions  Medication Sig Dispense Refill  . aspirin 81 MG tablet Take 81 mg by mouth daily.        . carvedilol (COREG) 25 MG tablet Take 25 mg by mouth 2 (two) times daily with a meal.        . COLCRYS 0.6 MG tablet TAKE 1 TABLET (0.6 MG TOTAL) BY MOUTH 2 (TWO) TIMES DAILY.  60 tablet  2  . digoxin (LANOXIN) 0.125 MG tablet Take 125 mcg by mouth daily.        . Ferrous Sulfate Dried (FEOSOL) 200 (65 FE) MG TABS Take by mouth 2 (two) times daily with a  meal.        . furosemide (LASIX) 40 MG tablet Take 40 mg by mouth daily. Take daily as directed      . lisinopril (PRINIVIL,ZESTRIL) 5 MG tablet Take 1 tablet (5 mg total) by mouth daily.  30 tablet  11  . PRECISION XTRA TEST STRIPS test strip as directed.      . simvastatin (ZOCOR) 20 MG tablet Take 10 mg by mouth at bedtime.        . TRADJENTA 5 MG TABS tablet TAKE 1 TABLET BY MOUTH DAILY FOR DIABETES  30 tablet  5    Allergies: Allergies  Allergen Reactions  . Spironolactone     History  Substance Use Topics  . Smoking status: Former Smoker -- 0.5 packs/day for 10 years    Types: Cigarettes    Quit date: 11/01/1975  . Smokeless tobacco: Never Used   Comment: quit over 30 years ago   . Alcohol Use: No     occasionally     PHYSICAL EXAM: VS:  BP 109/75  Pulse 94  Ht 5\' 9"  (1.753 m)  Wt 203 lb (92.08 kg)  BMI 29.98 kg/m2 Well nourished, well developed, in no acute distress HEENT: normal Neck: no JVD Cardiac:  normal S1, S2; RRR; 2/6  holosystolic murmur LLSB Lungs:  clear to auscultation bilaterally, no wheezing, rhonchi or rales Abd: soft, nontender, no hepatomegaly Ext: no edema Skin: warm and dry Neuro:  CNs 2-12 intact, no focal abnormalities noted  EKG:   Sinus rhythm, HR 90, leftward axis, non-specific ST-T changes, no change from prior tracings.  ASSESSMENT AND PLAN:  1.  Chronic Systolic Congestive Heart Failure   -  Doing well.  Volume stable.  Continue current regimen.  Check BMET again today to keep close eye on renal fxn and K+.      -  Follow up with Dr. Valera Castle in Oct 2013.  2.  Moderate Mitral Regurgitation   -  Continue medical management.  3.  Hypertension   -  Controlled.  Continue current therapy.    Signed, Tereso Newcomer, PA-C  10:51 AM 01/01/2012

## 2012-01-01 NOTE — Patient Instructions (Addendum)
Your physician recommends that you return for lab work in: BMET TODAY  Your physician recommends that you schedule a follow-up appointment in: 3 MONTHS WITH DR. Daleen Squibb

## 2012-01-02 ENCOUNTER — Telehealth: Payer: Self-pay | Admitting: *Deleted

## 2012-01-02 NOTE — Telephone Encounter (Signed)
Message copied by Tarri Fuller on Tue Jan 02, 2012  8:51 AM ------      Message from: [place], [place] T      Created: Mon Jan 01, 2012  1:44 PM       Perfect      Continue with current treatment plan.      Tereso Newcomer, PA-C  1:44 PM 01/01/2012

## 2012-01-02 NOTE — Telephone Encounter (Signed)
lmom labs great

## 2012-01-10 ENCOUNTER — Telehealth: Payer: Self-pay | Admitting: Cardiology

## 2012-01-10 ENCOUNTER — Other Ambulatory Visit: Payer: Medicare Other

## 2012-01-10 NOTE — Telephone Encounter (Signed)
I spoke with pt and he did have BMP on 01/01/12. Pt was aware of results then and no changes made. Lab cancelled per pt request. Mylo Red RN

## 2012-01-10 NOTE — Telephone Encounter (Signed)
Please return call to patient [phone] regarding labs sched for 2day 7/24 BMET.  Patient said he already had BMET labs on 12/15/11 wondered if this appnt is necessary.  Please cancel if he is advised no to have labs today.   Thks Meka

## 2012-02-14 ENCOUNTER — Encounter: Payer: Self-pay | Admitting: *Deleted

## 2012-02-20 ENCOUNTER — Encounter: Payer: Self-pay | Admitting: Internal Medicine

## 2012-02-20 ENCOUNTER — Ambulatory Visit (INDEPENDENT_AMBULATORY_CARE_PROVIDER_SITE_OTHER): Payer: Medicare Other | Admitting: *Deleted

## 2012-02-20 DIAGNOSIS — I472 Ventricular tachycardia: Secondary | ICD-10-CM

## 2012-02-20 DIAGNOSIS — I4729 Other ventricular tachycardia: Secondary | ICD-10-CM

## 2012-02-20 LAB — REMOTE ICD DEVICE
BRDY-0002RV: 40 {beats}/min
DEV-0020ICD: NEGATIVE
DEVICE MODEL ICD: 665247
RV LEAD AMPLITUDE: 12 mv
RV LEAD IMPEDENCE ICD: 360 Ohm
VENTRICULAR PACING ICD: 1 pct

## 2012-02-26 ENCOUNTER — Encounter: Payer: Self-pay | Admitting: *Deleted

## 2012-03-12 ENCOUNTER — Ambulatory Visit (INDEPENDENT_AMBULATORY_CARE_PROVIDER_SITE_OTHER): Payer: Medicare Other | Admitting: Internal Medicine

## 2012-03-12 ENCOUNTER — Encounter: Payer: Self-pay | Admitting: Internal Medicine

## 2012-03-12 ENCOUNTER — Other Ambulatory Visit (INDEPENDENT_AMBULATORY_CARE_PROVIDER_SITE_OTHER): Payer: Medicare Other

## 2012-03-12 VITALS — BP 110/70 | HR 83 | Temp 97.6°F | Resp 16 | Wt 205.0 lb

## 2012-03-12 DIAGNOSIS — N182 Chronic kidney disease, stage 2 (mild): Secondary | ICD-10-CM

## 2012-03-12 DIAGNOSIS — I10 Essential (primary) hypertension: Secondary | ICD-10-CM

## 2012-03-12 DIAGNOSIS — Z23 Encounter for immunization: Secondary | ICD-10-CM

## 2012-03-12 DIAGNOSIS — D649 Anemia, unspecified: Secondary | ICD-10-CM

## 2012-03-12 DIAGNOSIS — I509 Heart failure, unspecified: Secondary | ICD-10-CM

## 2012-03-12 DIAGNOSIS — E1129 Type 2 diabetes mellitus with other diabetic kidney complication: Secondary | ICD-10-CM

## 2012-03-12 DIAGNOSIS — I5023 Acute on chronic systolic (congestive) heart failure: Secondary | ICD-10-CM

## 2012-03-12 DIAGNOSIS — E785 Hyperlipidemia, unspecified: Secondary | ICD-10-CM

## 2012-03-12 DIAGNOSIS — N058 Unspecified nephritic syndrome with other morphologic changes: Secondary | ICD-10-CM

## 2012-03-12 DIAGNOSIS — E1165 Type 2 diabetes mellitus with hyperglycemia: Secondary | ICD-10-CM

## 2012-03-12 LAB — URINALYSIS, ROUTINE W REFLEX MICROSCOPIC
Hgb urine dipstick: NEGATIVE
Leukocytes, UA: NEGATIVE
Nitrite: NEGATIVE
Urobilinogen, UA: 0.2 (ref 0.0–1.0)

## 2012-03-12 LAB — CBC WITH DIFFERENTIAL/PLATELET
Eosinophils Absolute: 0.1 10*3/uL (ref 0.0–0.7)
Lymphocytes Relative: 25.7 % (ref 12.0–46.0)
MCHC: 31.5 g/dL (ref 30.0–36.0)
MCV: 81 fl (ref 78.0–100.0)
Monocytes Absolute: 0.6 10*3/uL (ref 0.1–1.0)
Neutrophils Relative %: 57.4 % (ref 43.0–77.0)
Platelets: 171 10*3/uL (ref 150.0–400.0)
WBC: 4.7 10*3/uL (ref 4.5–10.5)

## 2012-03-12 NOTE — Assessment & Plan Note (Signed)
His BP is well controlled, I will check his lytes and renal function 

## 2012-03-12 NOTE — Assessment & Plan Note (Signed)
I will recheck his CBC today 

## 2012-03-12 NOTE — Assessment & Plan Note (Signed)
His renal function is stable 

## 2012-03-12 NOTE — Progress Notes (Signed)
Subjective:    Patient ID: [name], male    DOB: 03-Nov-1940, 71 y.o.   MRN: [id number]  Diabetes He presents for his follow-up diabetic visit. He has type 2 diabetes mellitus. His disease course has been stable. There are no hypoglycemic associated symptoms. Pertinent negatives for diabetes include no blurred vision, no chest pain, no fatigue, no foot paresthesias, no foot ulcerations, no polydipsia, no polyphagia, no polyuria, no visual change, no weakness and no weight loss. There are no hypoglycemic complications. Diabetic complications include heart disease. Current diabetic treatment includes oral agent (monotherapy). He is compliant with treatment all of the time. His weight is stable. He is following a generally healthy diet. Meal planning includes avoidance of concentrated sweets. He has not had a previous visit with a dietician. He participates in exercise intermittently. There is no change in his home blood glucose trend. His breakfast blood glucose range is generally 130-140 mg/dl. His lunch blood glucose range is generally 130-140 mg/dl. His dinner blood glucose range is generally 130-140 mg/dl. His highest blood glucose is 130-140 mg/dl. His overall blood glucose range is 130-140 mg/dl. An ACE inhibitor/angiotensin II receptor blocker is being taken. He does not see a podiatrist.Eye exam is current.      Review of Systems  Constitutional: Negative.  Negative for weight loss and fatigue.  HENT: Negative.   Eyes: Negative.  Negative for blurred vision.  Respiratory: Negative for cough, chest tightness, shortness of breath and stridor.   Cardiovascular: Negative for chest pain, palpitations and leg swelling.  Gastrointestinal: Negative.   Genitourinary: Negative.  Negative for polyuria.  Musculoskeletal: Negative.   Neurological: Negative.  Negative for weakness.  Hematological: Negative.  Negative for polydipsia and polyphagia.  Psychiatric/Behavioral: Negative.          Objective:   Physical Exam  Vitals reviewed. Constitutional: He is oriented to person, place, and time. He appears well-developed and well-nourished. No distress.  HENT:  Head: Normocephalic and atraumatic.  Mouth/Throat: Oropharynx is clear and moist. No oropharyngeal exudate.  Eyes: Conjunctivae normal are normal. Right eye exhibits no discharge. Left eye exhibits no discharge. No scleral icterus.  Neck: Normal range of motion. Neck supple. No JVD present. No tracheal deviation present. No thyromegaly present.  Cardiovascular: Normal rate, regular rhythm and intact distal pulses.  Exam reveals no gallop and no friction rub.   Murmur heard. Pulmonary/Chest: Effort normal and breath sounds normal. No stridor. No respiratory distress. He has no wheezes. He has no rales. He exhibits no tenderness.  Abdominal: Soft. Bowel sounds are normal. He exhibits no distension and no mass. There is no tenderness. There is no rebound and no guarding.  Musculoskeletal: Normal range of motion. He exhibits no edema and no tenderness.  Lymphadenopathy:    He has no cervical adenopathy.  Neurological: He is oriented to person, place, and time.  Skin: Skin is warm and dry. No rash noted. He is not diaphoretic. No erythema. No pallor.  Psychiatric: He has a normal mood and affect. His behavior is normal. Judgment and thought content normal.      Lab Results  Component Value Date   WBC 4.7 11/07/2011   HGB 11.9* 11/07/2011   HCT 36.7* 11/07/2011   PLT 172.0 11/07/2011   GLUCOSE 117* 01/01/2012   CHOL 159 04/03/2011   TRIG 117.0 04/03/2011   HDL 47.50 04/03/2011   LDLCALC 88 04/03/2011   ALT 25 04/03/2011   AST 20 04/03/2011   NA 142 01/01/2012   K  4.3 01/01/2012   CL 109 01/01/2012   CREATININE 1.3 01/01/2012   BUN 27* 01/01/2012   CO2 25 01/01/2012   TSH 1.41 04/03/2011   PSA <0.01 ng/mL* 11/09/2008   INR 4.4 RATIO* 06/25/2008   HGBA1C 5.8 11/07/2011   MICROALBUR 14.1* 08/10/2011      Assessment & Plan:

## 2012-03-12 NOTE — Assessment & Plan Note (Signed)
He has a normal volume status today, I will check his renal function and his dig level

## 2012-03-12 NOTE — Assessment & Plan Note (Signed)
I will check his a1c and will change his treatment if needed

## 2012-03-12 NOTE — Patient Instructions (Signed)

## 2012-03-12 NOTE — Assessment & Plan Note (Signed)
FLP today 

## 2012-03-13 ENCOUNTER — Encounter: Payer: Self-pay | Admitting: Internal Medicine

## 2012-03-13 LAB — COMPREHENSIVE METABOLIC PANEL
Albumin: 3.9 g/dL (ref 3.5–5.2)
Alkaline Phosphatase: 48 U/L (ref 39–117)
Calcium: 9.1 mg/dL (ref 8.4–10.5)
Chloride: 110 mEq/L (ref 96–112)
Glucose, Bld: 104 mg/dL — ABNORMAL HIGH (ref 70–99)
Potassium: 4.7 mEq/L (ref 3.5–5.1)
Sodium: 143 mEq/L (ref 135–145)
Total Protein: 7.3 g/dL (ref 6.0–8.3)

## 2012-03-13 LAB — LIPID PANEL
Cholesterol: 136 mg/dL (ref 0–200)
Triglycerides: 78 mg/dL (ref 0.0–149.0)

## 2012-03-14 ENCOUNTER — Encounter: Payer: Self-pay | Admitting: Cardiology

## 2012-03-14 ENCOUNTER — Ambulatory Visit (INDEPENDENT_AMBULATORY_CARE_PROVIDER_SITE_OTHER): Payer: Medicare Other | Admitting: Cardiology

## 2012-03-14 VITALS — BP 124/82 | HR 92 | Ht 69.0 in | Wt 205.8 lb

## 2012-03-14 DIAGNOSIS — I5022 Chronic systolic (congestive) heart failure: Secondary | ICD-10-CM

## 2012-03-14 DIAGNOSIS — I509 Heart failure, unspecified: Secondary | ICD-10-CM

## 2012-03-14 DIAGNOSIS — I10 Essential (primary) hypertension: Secondary | ICD-10-CM

## 2012-03-14 DIAGNOSIS — E785 Hyperlipidemia, unspecified: Secondary | ICD-10-CM

## 2012-03-14 DIAGNOSIS — I472 Ventricular tachycardia, unspecified: Secondary | ICD-10-CM

## 2012-03-14 DIAGNOSIS — Z95810 Presence of automatic (implantable) cardiac defibrillator: Secondary | ICD-10-CM

## 2012-03-14 NOTE — Progress Notes (Signed)
HPI [name] turns today for evaluation and management of his history of chronic systolic heart failure.  He currently denies any chest pain, shortness of breath, orthopnea, or edema. He weighs himself daily. His weight has been stable, in fact he's lost about 7 or 8 pounds with improved diet. He takes all his meds. He was just evaluated by primary care with Dr. Yetta Barre in all blood work I have reviewed and is stable. Digoxin level was 1.1 lipids are at goal.  His defibrillator. has not fired.  Past Medical History  Diagnosis Date  . AICD (automatic cardioverter/defibrillator) present   . Obesity   . Prostate cancer   . Glucose intolerance (impaired glucose tolerance)   . Bursitis of elbow     left  . Osteoarthritis   . HTN (hypertension)   . HLD (hyperlipidemia)   . History of DVT (deep vein thrombosis)   . Chronic systolic heart failure     echo 4/13: EF 25-30%, mod MR, mod LAE, PASP 47  . NICM (nonischemic cardiomyopathy)     normal cors by cath in 1990s;  nuclear 8/08: no ischemia; inf/lat/dist ant/apical scar  . Moderate mitral regurgitation     echo 4/13    Current Outpatient Prescriptions  Medication Sig Dispense Refill  . aspirin 81 MG tablet Take 81 mg by mouth daily.        . carvedilol (COREG) 25 MG tablet Take 25 mg by mouth 2 (two) times daily with a meal.        . COLCRYS 0.6 MG tablet TAKE 1 TABLET (0.6 MG TOTAL) BY MOUTH 2 (TWO) TIMES DAILY.  60 tablet  2  . digoxin (LANOXIN) 0.125 MG tablet Take 125 mcg by mouth daily.        . Ferrous Sulfate Dried (FEOSOL) 200 (65 FE) MG TABS Take by mouth 2 (two) times daily with a meal.        . furosemide (LASIX) 40 MG tablet Take 40 mg by mouth daily. Take daily as directed      . lisinopril (PRINIVIL,ZESTRIL) 5 MG tablet Take 1 tablet (5 mg total) by mouth daily.  30 tablet  11  . PRECISION XTRA TEST STRIPS test strip as directed.      . simvastatin (ZOCOR) 20 MG tablet Take 10 mg by mouth at bedtime.        . TRADJENTA 5 MG  TABS tablet TAKE 1 TABLET BY MOUTH DAILY FOR DIABETES  30 tablet  5    Allergies  Allergen Reactions  . Spironolactone     Family History  Problem Relation Age of Onset  . Cancer Father     prostate  . Heart disease Mother     History   Social History  . Marital Status: Married    Spouse Name: N/A    Number of Children: N/A  . Years of Education: N/A   Occupational History  . Paramedic    Social History Main Topics  . Smoking status: Former Smoker -- 0.5 packs/day for 10 years    Types: Cigarettes    Quit date: 11/01/1975  . Smokeless tobacco: Never Used   Comment: quit over 30 years ago   . Alcohol Use: No     occasionally  . Drug Use: No  . Sexually Active: Not Currently   Other Topics Concern  . Not on file   Social History Narrative  . No narrative on file    ROS ALL NEGATIVE EXCEPT THOSE  NOTED IN HPI  PE  General Appearance: well developed, well nourished in no acute distress HEENT: symmetrical face, PERRLA, good dentition  Neck: no JVD, thyromegaly, or adenopathy, trachea midline Chest: symmetric without deformity Cardiac: PMI non-displaced, RRR, normal S1, S2, no gallop or murmur Lung: clear to ausculation and percussion Vascular: all pulses full without bruits  Abdominal: nondistended, nontender, good bowel sounds, no HSM, no bruits Extremities: no cyanosis, clubbing or edema, no sign of DVT, no varicosities  Skin: normal color, no rashes Neuro: alert and oriented x 3, non-focal Pysch: normal affect  EKG  BMET    Component Value Date/Time   NA 143 03/12/2012 1040   K 4.7 03/12/2012 1040   CL 110 03/12/2012 1040   CO2 23 03/12/2012 1040   GLUCOSE 104* 03/12/2012 1040   BUN 38* 03/12/2012 1040   CREATININE 1.4 03/12/2012 1040   CREATININE 1.48* 12/15/2011 1621   CALCIUM 9.1 03/12/2012 1040   GFRNONAA 69.86 04/26/2010 1041   GFRAA  Value: 57        The eGFR has been calculated using the MDRD equation. This calculation has not been validated  in all clinical situations. eGFR's persistently <60 mL/min signify possible Chronic Kidney Disease.* 06/22/2008 0527    Lipid Panel     Component Value Date/Time   CHOL 136 03/12/2012 1040   TRIG 78.0 03/12/2012 1040   HDL 43.10 03/12/2012 1040   CHOLHDL 3 03/12/2012 1040   VLDL 15.6 03/12/2012 1040   LDLCALC 77 03/12/2012 1040    CBC    Component Value Date/Time   WBC 4.7 03/12/2012 1040   RBC 4.93 03/12/2012 1040   HGB 12.6* 03/12/2012 1040   HCT 39.9 03/12/2012 1040   PLT 171.0 03/12/2012 1040   MCV 81.0 03/12/2012 1040   MCHC 31.5 03/12/2012 1040   RDW 19.8* 03/12/2012 1040   LYMPHSABS 1.2 03/12/2012 1040   MONOABS 0.6 03/12/2012 1040   EOSABS 0.1 03/12/2012 1040   BASOSABS 0.0 03/12/2012 1040

## 2012-03-14 NOTE — Assessment & Plan Note (Signed)
Stable. Continue current medical program. We'll see him back in one year.

## 2012-03-14 NOTE — Patient Instructions (Addendum)
Your physician recommends that you continue on your current medications as directed. Please refer to the Current Medication list given to you today.   Your physician wants you to follow-up in: 1 year with Dr. Wall. You will receive a reminder letter in the mail two months in advance. If you don't receive a letter, please call our office to schedule the follow-up appointment.  

## 2012-03-29 ENCOUNTER — Other Ambulatory Visit: Payer: Self-pay | Admitting: Internal Medicine

## 2012-05-17 ENCOUNTER — Other Ambulatory Visit: Payer: Self-pay | Admitting: Internal Medicine

## 2012-05-20 ENCOUNTER — Ambulatory Visit (INDEPENDENT_AMBULATORY_CARE_PROVIDER_SITE_OTHER): Payer: Medicare Other | Admitting: *Deleted

## 2012-05-20 ENCOUNTER — Encounter: Payer: Self-pay | Admitting: Internal Medicine

## 2012-05-20 DIAGNOSIS — I472 Ventricular tachycardia: Secondary | ICD-10-CM

## 2012-05-20 DIAGNOSIS — I4729 Other ventricular tachycardia: Secondary | ICD-10-CM

## 2012-05-20 DIAGNOSIS — Z95810 Presence of automatic (implantable) cardiac defibrillator: Secondary | ICD-10-CM

## 2012-05-20 LAB — REMOTE ICD DEVICE
BATTERY VOLTAGE: 2.98 V
RV LEAD IMPEDENCE ICD: 360 Ohm
VENTRICULAR PACING ICD: 1 pct

## 2012-05-29 ENCOUNTER — Encounter: Payer: Self-pay | Admitting: *Deleted

## 2012-06-20 ENCOUNTER — Other Ambulatory Visit: Payer: Self-pay | Admitting: Internal Medicine

## 2012-06-26 ENCOUNTER — Encounter: Payer: Self-pay | Admitting: Internal Medicine

## 2012-06-26 ENCOUNTER — Other Ambulatory Visit (INDEPENDENT_AMBULATORY_CARE_PROVIDER_SITE_OTHER): Payer: Medicare Other

## 2012-06-26 ENCOUNTER — Ambulatory Visit (INDEPENDENT_AMBULATORY_CARE_PROVIDER_SITE_OTHER): Payer: Medicare Other | Admitting: Internal Medicine

## 2012-06-26 VITALS — BP 128/84 | HR 94 | Temp 97.5°F | Ht 69.0 in | Wt 202.0 lb

## 2012-06-26 DIAGNOSIS — N289 Disorder of kidney and ureter, unspecified: Secondary | ICD-10-CM

## 2012-06-26 DIAGNOSIS — M10341 Gout due to renal impairment, right hand: Secondary | ICD-10-CM

## 2012-06-26 DIAGNOSIS — M109 Gout, unspecified: Secondary | ICD-10-CM

## 2012-06-26 LAB — URIC ACID: Uric Acid, Serum: 8.9 mg/dL — ABNORMAL HIGH (ref 4.0–7.8)

## 2012-06-26 LAB — RENAL FUNCTION PANEL
Calcium: 9.2 mg/dL (ref 8.4–10.5)
Creatinine, Ser: 1.4 mg/dL (ref 0.4–1.5)
Glucose, Bld: 125 mg/dL — ABNORMAL HIGH (ref 70–99)
Phosphorus: 3.7 mg/dL (ref 2.3–4.6)
Sodium: 141 mEq/L (ref 135–145)

## 2012-06-26 MED ORDER — HYDROCODONE-ACETAMINOPHEN 5-500 MG PO TABS: 1.0000 | ORAL_TABLET | Freq: Three times a day (TID) | ORAL | Status: DC | PRN

## 2012-06-26 MED ORDER — ALLOPURINOL 100 MG PO TABS: 100.0000 mg | ORAL_TABLET | Freq: Two times a day (BID) | ORAL | Status: DC

## 2012-06-26 NOTE — Patient Instructions (Addendum)
Stop taking Colcrys. Start Allopurinol 100 mg two times per day. Take 800 mg twice per day x 3 days. Take pain medicine as indicated. Gout Gout is an inflammatory condition (arthritis) caused by a buildup of uric acid crystals in the joints. Uric acid is a chemical that is normally present in the blood. Under some circumstances, uric acid can form into crystals in your joints. This causes joint redness, soreness, and swelling (inflammation). Repeat attacks are common. Over time, uric acid crystals can form into masses (tophi) near a joint, causing disfigurement. Gout is treatable and often preventable. CAUSES   The disease begins with elevated levels of uric acid in the blood. Uric acid is produced by your body when it breaks down a naturally found substance called purines. This also happens when you eat certain foods such as meats and fish. Causes of an elevated uric acid level include:  Being passed down from parent to child (heredity).   Diseases that cause increased uric acid production (obesity, psoriasis, some cancers).   Excessive alcohol use.   Diet, especially diets rich in meat and seafood.   Medicines, including certain cancer-fighting drugs (chemotherapy), diuretics, and aspirin.   Chronic kidney disease. The kidneys are no longer able to remove uric acid well.   Problems with metabolism.  Conditions strongly associated with gout include:  Obesity.   High blood pressure.   High cholesterol.   Diabetes.  Not everyone with elevated uric acid levels gets gout. It is not understood why some people get gout and others do not. Surgery, joint injury, and eating too much of certain foods are some of the factors that can lead to gout. SYMPTOMS    An attack of gout comes on quickly. It causes intense pain with redness, swelling, and warmth in a joint.   Fever can occur.   Often, only one joint is involved. Certain joints are more commonly involved:   Base of the big toe.    Knee.   Ankle.   Wrist.   Finger.  Without treatment, an attack usually goes away in a few days to weeks. Between attacks, you usually will not have symptoms, which is different from many other forms of arthritis. DIAGNOSIS   Your caregiver will suspect gout based on your symptoms and exam. Removal of fluid from the joint (arthrocentesis) is done to check for uric acid crystals. Your caregiver will give you a medicine that numbs the area (local anesthetic) and use a needle to remove joint fluid for exam. Gout is confirmed when uric acid crystals are seen in joint fluid, using a special microscope. Sometimes, blood, urine, and X-ray tests are also used. TREATMENT   There are 2 phases to gout treatment: treating the sudden onset (acute) attack and preventing attacks (prophylaxis). Treatment of an Acute Attack  Medicines are used. These include anti-inflammatory medicines or steroid medicines.   An injection of steroid medicine into the affected joint is sometimes necessary.   The painful joint is rested. Movement can worsen the arthritis.   You may use warm or cold treatments on painful joints, depending which works best for you.   Discuss the use of coffee, vitamin C, or cherries with your caregiver. These may be helpful treatment options.  Treatment to Prevent Attacks After the acute attack subsides, your caregiver may advise prophylactic medicine. These medicines either help your kidneys eliminate uric acid from your body or decrease your uric acid production. You may need to stay on these medicines for a  very long time. The early phase of treatment with prophylactic medicine can be associated with an increase in acute gout attacks. For this reason, during the first few months of treatment, your caregiver may also advise you to take medicines usually used for acute gout treatment. Be sure you understand your caregiver's directions. You should also discuss dietary treatment with your  caregiver. Certain foods such as meats and fish can increase uric acid levels. Other foods such as dairy can decrease levels. Your caregiver can give you a list of foods to avoid. HOME CARE INSTRUCTIONS    Do not take aspirin to relieve pain. This raises uric acid levels.   Only take over-the-counter or prescription medicines for pain, discomfort, or fever as directed by your caregiver.   Rest the joint as much as possible. When in bed, keep sheets and blankets off painful areas.   Keep the affected joint raised (elevated).   Use crutches if the painful joint is in your leg.   Drink enough water and fluids to keep your urine clear or pale yellow. This helps your body get rid of uric acid. Do not drink alcoholic beverages. They slow the passage of uric acid.   Follow your caregiver's dietary instructions. Pay careful attention to the amount of protein you eat. Your daily diet should emphasize fruits, vegetables, whole grains, and fat-free or low-fat milk products.   Maintain a healthy body weight.  SEEK MEDICAL CARE IF:    You have an oral temperature above 102 F (38.9 C).   You develop diarrhea, vomiting, or any side effects from medicines.   You do not feel better in 24 hours, or you are getting worse.  SEEK IMMEDIATE MEDICAL CARE IF:    Your joint becomes suddenly more tender and you have:   Chills.   An oral temperature above 102 F (38.9 C), not controlled by medicine.  MAKE SURE YOU:    Understand these instructions.   Will watch your condition.   Will get help right away if you are not doing well or get worse.  Document Released: 06/02/2000 Document Revised: 08/28/2011 Document Reviewed: 09/13/2009 [facility] Patient Information 2013 [place], [place].

## 2012-06-26 NOTE — Progress Notes (Signed)
Subjective:    Patient ID: [name], male    DOB: 1941-06-10, 72 y.o.   MRN: [id number]  HPI  Pt presents to the clinic today c/o hand pain and swelling x 6 days ago. It started with pain and swelling of the joint of his pointer finger on his right hand. It then spread to the joint of the middle finger on his right hand. It is is hot and tender to touch. He is unable to bend his hand or brush his hair. He has been taking colchicine without relief. He does have a history of gout. He denies consumption of red meats or alcohol.  Review of Systems      Past Medical History  Diagnosis Date  . AICD (automatic cardioverter/defibrillator) present   . Obesity   . Prostate cancer   . Glucose intolerance (impaired glucose tolerance)   . Bursitis of elbow     left  . Osteoarthritis   . HTN (hypertension)   . HLD (hyperlipidemia)   . History of DVT (deep vein thrombosis)   . Chronic systolic heart failure     echo 4/13: EF 25-30%, mod MR, mod LAE, PASP 47  . NICM (nonischemic cardiomyopathy)     normal cors by cath in 1990s;  nuclear 8/08: no ischemia; inf/lat/dist ant/apical scar  . Moderate mitral regurgitation     echo 4/13    Current Outpatient Prescriptions  Medication Sig Dispense Refill  . aspirin 81 MG tablet Take 81 mg by mouth daily.        . carvedilol (COREG) 25 MG tablet Take 25 mg by mouth 2 (two) times daily with a meal.        . COLCRYS 0.6 MG tablet TAKE 1 TABLET (0.6 MG TOTAL) BY MOUTH 2 (TWO) TIMES DAILY.  60 tablet  2  . COLCRYS 0.6 MG tablet TAKE 1 TABLET (0.6 MG TOTAL) BY MOUTH 2 (TWO) TIMES DAILY.  180 tablet  3  . COLCRYS 0.6 MG tablet TAKE 1 TABLET (0.6 MG TOTAL) BY MOUTH 2 (TWO) TIMES DAILY.  180 tablet  3  . digoxin (LANOXIN) 0.125 MG tablet Take 125 mcg by mouth daily.        . Ferrous Sulfate Dried (FEOSOL) 200 (65 FE) MG TABS Take by mouth 2 (two) times daily with a meal.        . furosemide (LASIX) 40 MG tablet Take 40 mg by mouth daily. Take daily as  directed      . lisinopril (PRINIVIL,ZESTRIL) 5 MG tablet Take 1 tablet (5 mg total) by mouth daily.  30 tablet  11  . PRECISION XTRA TEST STRIPS test strip as directed.      . simvastatin (ZOCOR) 20 MG tablet Take 10 mg by mouth at bedtime.        . TRADJENTA 5 MG TABS tablet TAKE 1 TABLET BY MOUTH DAILY FOR DIABETES  30 tablet  5    Allergies  Allergen Reactions  . Spironolactone     Family History  Problem Relation Age of Onset  . Cancer Father     prostate  . Heart disease Mother     History   Social History  . Marital Status: Married    Spouse Name: N/A    Number of Children: N/A  . Years of Education: N/A   Occupational History  . Paramedic    Social History Main Topics  . Smoking status: Former Smoker -- 0.5 packs/day for 10 years  Types: Cigarettes    Quit date: 11/01/1975  . Smokeless tobacco: Never Used     Comment: quit over 30 years ago   . Alcohol Use: No     Comment: occasionally  . Drug Use: No  . Sexually Active: Not Currently   Other Topics Concern  . Not on file   Social History Narrative  . No narrative on file     Constitutional: Denies fever, malaise, fatigue, headache or abrupt weight changes.  Musculoskeletal: Pt reports hand pain and swelling. Denies decrease in range of motion, difficulty with gait, muscle pain..  Skin: Denies redness, rashes, lesions or ulcercations.  Neurological: Denies numbness and tingling of her hand, dizziness, difficulty with memory, difficulty with speech or problems with balance and coordination.   No other specific complaints in a complete review of systems (except as listed in HPI above).  Objective:   Physical Exam   BP 128/84  Pulse 94  Temp 97.5 F (36.4 C) (Oral)  Ht 5\' 9"  (1.753 m)  Wt 202 lb (91.627 kg)  BMI 29.83 kg/m2  SpO2 99% Wt Readings from Last 3 Encounters:  06/26/12 202 lb (91.627 kg)  03/14/12 205 lb 12.8 oz (93.35 kg)  03/12/12 205 lb (92.987 kg)    General: Appears  his, stated age, well developed, well nourished in NAD. Skin: Swelling and warmth noted on the PIP of the pointer and middle finger, right hand. Warm, dry and intact. No rashes, lesions or ulcerations noted. Cardiovascular: Normal rate and rhythm. S1,S2 noted.  No murmur, rubs or gallops noted. No JVD or BLE edema. No carotid bruits noted. Pulmonary/Chest: Normal effort and positive vesicular breath sounds. No respiratory distress. No wheezes, rales or ronchi noted.  Musculoskeletal: 2= swelling of the PIP of the pointer and middle finger. Decreased range of motion. No difficulty with gait.  Neurological: Alert and oriented. Cranial nerves II-XII intact. Coordination normal. +DTRs bilaterally.       Assessment & Plan:  Acute gouty arthropathy, new onset with additional workup required:  D/C colchicine Pt declines indomethacin, would prefer Ibuprofen 800 mg eRx given for allopurinal 100 mg BID Uric acid level and renal function panel today  RTC as needed or if symptoms persist

## 2012-07-10 ENCOUNTER — Encounter: Payer: Self-pay | Admitting: Internal Medicine

## 2012-07-10 ENCOUNTER — Ambulatory Visit (INDEPENDENT_AMBULATORY_CARE_PROVIDER_SITE_OTHER): Payer: Medicare Other | Admitting: Internal Medicine

## 2012-07-10 VITALS — BP 112/70 | HR 96 | Temp 97.7°F | Resp 16 | Wt 202.8 lb

## 2012-07-10 DIAGNOSIS — M1030 Gout due to renal impairment, unspecified site: Secondary | ICD-10-CM

## 2012-07-10 DIAGNOSIS — I10 Essential (primary) hypertension: Secondary | ICD-10-CM

## 2012-07-10 DIAGNOSIS — M109 Gout, unspecified: Secondary | ICD-10-CM

## 2012-07-10 MED ORDER — COLCHICINE 0.6 MG PO TABS: 0.6000 mg | ORAL_TABLET | Freq: Two times a day (BID) | ORAL | Status: DC

## 2012-07-10 MED ORDER — METHYLPREDNISOLONE ACETATE 80 MG/ML IJ SUSP
120.0000 mg | Freq: Once | INTRAMUSCULAR | Status: AC
  Administered 2012-07-10: 120 mg via INTRAMUSCULAR

## 2012-07-10 MED ORDER — FEBUXOSTAT 80 MG PO TABS: 1.0000 | ORAL_TABLET | Freq: Every day | ORAL | Status: DC

## 2012-07-10 NOTE — Assessment & Plan Note (Signed)
His BP is well controlled His recent lytes look good

## 2012-07-10 NOTE — Addendum Note (Signed)
Addended by: Rock Nephew T on: 07/10/2012 10:58 AM   Modules accepted: Orders

## 2012-07-10 NOTE — Progress Notes (Signed)
Subjective:    Patient ID: [name], male    DOB: 11-21-1940, 72 y.o.   MRN: [id number]  Arthritis Presents for follow-up visit. He complains of pain, stiffness, joint swelling and joint warmth. Affected locations include the left wrist. His pain is at a severity of 3/10. Associated symptoms include pain at night and pain while resting. Pertinent negatives include no diarrhea, dry eyes, dry mouth, dysuria, fatigue, fever, rash, Raynaud's syndrome, uveitis or weight loss. Compliance with total regimen is 26-50%. Compliance problems include psychosocial issues.  Compliance with medications is 26-50%.      Review of Systems  Constitutional: Negative for fever, chills, weight loss, diaphoresis, activity change, appetite change, fatigue and unexpected weight change.  HENT: Negative.   Eyes: Negative.   Respiratory: Negative for cough, chest tightness, shortness of breath, wheezing and stridor.   Cardiovascular: Negative for chest pain, palpitations and leg swelling.  Gastrointestinal: Negative for nausea, vomiting, abdominal pain and diarrhea.  Genitourinary: Negative.  Negative for dysuria.  Musculoskeletal: Positive for joint swelling, arthralgias, arthritis and stiffness. Negative for myalgias, back pain and gait problem.  Skin: Negative for color change, pallor, rash and wound.  Neurological: Negative.   Hematological: Negative for adenopathy. Does not bruise/bleed easily.  Psychiatric/Behavioral: Negative.        Objective:   Physical Exam  Vitals reviewed. Constitutional: He is oriented to person, place, and time. He appears well-developed and well-nourished. No distress.  HENT:  Head: Normocephalic and atraumatic.  Mouth/Throat: Oropharynx is clear and moist. No oropharyngeal exudate.  Eyes: Conjunctivae normal are normal. Right eye exhibits no discharge. Left eye exhibits no discharge. No scleral icterus.  Neck: Normal range of motion. Neck supple. No JVD present. No tracheal  deviation present. No thyromegaly present.  Cardiovascular: Normal rate, regular rhythm and intact distal pulses.  Exam reveals no gallop and no friction rub.   Murmur heard. Pulmonary/Chest: Effort normal. No stridor. No respiratory distress. He has no wheezes. He has no rales. He exhibits no tenderness.  Abdominal: Soft. Bowel sounds are normal. He exhibits no distension. There is no tenderness. There is no rebound and no guarding.  Musculoskeletal: Normal range of motion. He exhibits no edema and no tenderness.       Left wrist: He exhibits tenderness and swelling. He exhibits normal range of motion, no bony tenderness, no effusion, no crepitus, no deformity and no laceration.  Lymphadenopathy:    He has no cervical adenopathy.  Neurological: He is oriented to person, place, and time.  Skin: Skin is warm and dry. No rash noted. He is not diaphoretic. No erythema. No pallor.  Psychiatric: He has a normal mood and affect. His behavior is normal. Thought content normal.      Lab Results  Component Value Date   WBC 4.7 03/12/2012   HGB 12.6* 03/12/2012   HCT 39.9 03/12/2012   PLT 171.0 03/12/2012   GLUCOSE 125* 06/26/2012   CHOL 136 03/12/2012   TRIG 78.0 03/12/2012   HDL 43.10 03/12/2012   LDLCALC 77 03/12/2012   ALT 21 03/12/2012   AST 25 03/12/2012   NA 141 06/26/2012   K 4.1 06/26/2012   CL 106 06/26/2012   CREATININE 1.4 06/26/2012   BUN 33* 06/26/2012   CO2 28 06/26/2012   TSH 1.46 03/12/2012   PSA <0.01 ng/mL* 11/09/2008   INR 4.4 RATIO* 06/25/2008   HGBA1C 6.0 03/12/2012   MICROALBUR 14.1* 08/10/2011  Results for TEYON, ODETTE (MRN [id number]) as of 07/10/2012 10:14  Ref. Range 05/20/2012 10:49 05/20/2012 10:49 05/20/2012 10:49 05/20/2012 10:49 06/26/2012 09:27  Uric Acid, Serum Latest Range: 4.0-7.8 mg/dL     8.9 (H)     Assessment & Plan:

## 2012-07-10 NOTE — Assessment & Plan Note (Signed)
He was given an injection of depo-medrol IM today He will restart colchicine He will change allopurinol to uloric He will continue hydrocodone for pain

## 2012-07-10 NOTE — Assessment & Plan Note (Signed)
Stop allopurinol, start uloric (more effective) Restart colchicine

## 2012-07-10 NOTE — Patient Instructions (Signed)
Arthritis - Gout Gout is caused by uric acid crystals forming in the joint. The big toe, foot, ankle, and knee are the joints most often affected.Often uric acid levels in the blood are also elevated. Symptoms develop rapidly, usually over hours. A joint fluid exam may be needed to prove the diagnosis. Acute gout episodes may follow a minor injury, surgery, illness or medication change. Gout occurs more often in men. It tends to be an inherited (passed down from a family member) condition. Your chance of having gout is increased if you take certain medications. These include water pills (diuretics), aspirin, niacin, and cyclosporine. Kidney disease and alcohol consumption may also increase your chances of getting gout. Months or years can pass between gout attacks.  Treatment includes ice, rest and raising the affected limb until the swelling and pain are better.Anti-inflammatory medicine usually brings about dramatic relief of pain, redness and swelling within 2 to 3 days. Other medications can also be effective. Increase your fluid intake. Do not drink alcohol or eat liver, sweetbreads, or sardines. Long-term gout management may require medicine to lower blood uric acid levels, or changing or stopping diuretics. Please see your caregiver if your condition is not better after 1 to 2 days of treatment.  SEEK IMMEDIATE MEDICAL CARE IF:  You have more serious symptoms such as a fever, skin rash, diarrhea, vomiting, or other joint pains. Document Released: 07/13/2004 Document Revised: 05/25/2011 Document Reviewed: 07/27/2008 [employer] Patient Information 2012 [employer]. 

## 2012-07-11 ENCOUNTER — Telehealth: Payer: Self-pay | Admitting: Internal Medicine

## 2012-07-11 ENCOUNTER — Other Ambulatory Visit: Payer: Self-pay | Admitting: Internal Medicine

## 2012-07-11 DIAGNOSIS — M109 Gout, unspecified: Secondary | ICD-10-CM

## 2012-07-11 MED ORDER — HYDROCODONE-ACETAMINOPHEN 10-325 MG PO TABS: 1.0000 | ORAL_TABLET | Freq: Three times a day (TID) | ORAL | Status: DC | PRN

## 2012-07-11 NOTE — Telephone Encounter (Signed)
Patient Information:  Caller Name: Jeramie  Phone: [phone]  Patient: [name], [name]  Gender: Male  DOB: [birth year]  Age: 72 Years  PCP: Sanda Linger (Adults only)  Office Follow Up:  Does the office need to follow up with this patient?: Yes  Instructions For The Office: Patient was seen in office for left hand and wrist pain/swelling. States pain and swelling have increased. Patient is out of Hydrocodone. Patient uses [facility] on [address] @ [phone]. Patient can be reached at [phone]. Please return call to patient with recommendation.  RN Note:  Patient states he has had pain and swelling in left wrist and hand X 2.5 weeks. States he was seen in office 07/10/12 and received Solu Medrol injection. Patient was prescribed Colchicine and Uloric. Patient states no improvement in above sx. Patient confirms that he is taking medications as prescribed.  Patient states pain and swelling increased 07/11/12 a.m. Denies erythema. States wrist and hand feel "stiff." States he is unable to bend his fingers. Patient states pain is at an "8" on a 1-10 scale. Patient declines appt.  Patient states he is out of Hydrocodone. States he called Pharmacy for refill request. RN reviewed documentation in Epic, Electronic Health Record, from office visit 07/10/12. RN noted documentation that patient is to continue Hydrocodone for pain. Care advice given per guidelines. Call back parameters reviewed. Patient verbalizes understanding. Patient uses [facility] on [address] @ [phone]. Patient can be reached at [phone]. Please return call to patient with recommendation.  Symptoms  Reason For Call & Symptoms: Pain and swelling left hand and wrist  Reviewed Health History In EMR: Yes  Reviewed Medications In EMR: Yes  Reviewed Allergies In EMR: Yes  Reviewed Surgeries / Procedures: Yes  Date of Onset of Symptoms: 06/19/2012  Treatments Tried: Solu medrol injection 07/10/12, Colchicine, Uloric  Treatments Tried Worked: No  Guideline(s) Used:  Hand and Wrist Pain  Disposition Per Guideline:   See Within 3 Days in Office  Reason For Disposition Reached:   Moderate pain (e.g., interferes with normal activities) and present > 3 days  Advice Given:  N/A  Patient Refused Recommendation:  Patient Will Follow Up With Office Later  Patient was seen in office for left hand and wrist pain/swelling. States pain and swelling have increased. Patient is out of Hydrocodone. Patient uses [facility] on [address] @ [phone]. Patient can be reached at [phone]. Please return call to patient with recommendation.

## 2012-07-11 NOTE — Telephone Encounter (Signed)
done

## 2012-08-21 LAB — HM DIABETES EYE EXAM: HM Diabetic Eye Exam: NORMAL

## 2012-09-11 ENCOUNTER — Encounter: Payer: Self-pay | Admitting: Internal Medicine

## 2012-09-11 ENCOUNTER — Ambulatory Visit (INDEPENDENT_AMBULATORY_CARE_PROVIDER_SITE_OTHER): Payer: Medicare Other | Admitting: Internal Medicine

## 2012-09-11 ENCOUNTER — Other Ambulatory Visit (INDEPENDENT_AMBULATORY_CARE_PROVIDER_SITE_OTHER): Payer: Medicare Other

## 2012-09-11 ENCOUNTER — Ambulatory Visit (INDEPENDENT_AMBULATORY_CARE_PROVIDER_SITE_OTHER)
Admission: RE | Admit: 2012-09-11 | Discharge: 2012-09-11 | Disposition: A | Discharge status: Home / Self Care | Payer: Medicare Other | Source: Ambulatory Visit | Attending: Internal Medicine | Admitting: Internal Medicine

## 2012-09-11 VITALS — BP 110/64 | HR 87 | Temp 98.6°F | Resp 16 | Wt 198.0 lb

## 2012-09-11 DIAGNOSIS — N182 Chronic kidney disease, stage 2 (mild): Secondary | ICD-10-CM

## 2012-09-11 DIAGNOSIS — I5022 Chronic systolic (congestive) heart failure: Secondary | ICD-10-CM

## 2012-09-11 DIAGNOSIS — M79646 Pain in unspecified finger(s): Secondary | ICD-10-CM | POA: Insufficient documentation

## 2012-09-11 DIAGNOSIS — I10 Essential (primary) hypertension: Secondary | ICD-10-CM

## 2012-09-11 DIAGNOSIS — M109 Gout, unspecified: Secondary | ICD-10-CM

## 2012-09-11 DIAGNOSIS — M79609 Pain in unspecified limb: Secondary | ICD-10-CM

## 2012-09-11 DIAGNOSIS — I472 Ventricular tachycardia: Secondary | ICD-10-CM

## 2012-09-11 DIAGNOSIS — M1030 Gout due to renal impairment, unspecified site: Secondary | ICD-10-CM

## 2012-09-11 DIAGNOSIS — I509 Heart failure, unspecified: Secondary | ICD-10-CM

## 2012-09-11 DIAGNOSIS — D649 Anemia, unspecified: Secondary | ICD-10-CM

## 2012-09-11 DIAGNOSIS — E1129 Type 2 diabetes mellitus with other diabetic kidney complication: Secondary | ICD-10-CM

## 2012-09-11 DIAGNOSIS — I340 Nonrheumatic mitral (valve) insufficiency: Secondary | ICD-10-CM

## 2012-09-11 DIAGNOSIS — E1165 Type 2 diabetes mellitus with hyperglycemia: Secondary | ICD-10-CM

## 2012-09-11 LAB — HEMOGLOBIN A1C: Hgb A1c MFr Bld: 6.4 % (ref 4.6–6.5)

## 2012-09-11 LAB — BASIC METABOLIC PANEL
Chloride: 108 mEq/L (ref 96–112)
Potassium: 4 mEq/L (ref 3.5–5.1)
Sodium: 141 mEq/L (ref 135–145)

## 2012-09-11 LAB — CBC WITH DIFFERENTIAL/PLATELET
Basophils Absolute: 0 K/uL (ref 0.0–0.1)
Basophils Relative: 0.7 % (ref 0.0–3.0)
Eosinophils Absolute: 0.1 K/uL (ref 0.0–0.7)
Eosinophils Relative: 2.4 % (ref 0.0–5.0)
HCT: 39 % (ref 39.0–52.0)
Hemoglobin: 12.8 g/dL — ABNORMAL LOW (ref 13.0–17.0)
Lymphocytes Relative: 21.8 % (ref 12.0–46.0)
Lymphs Abs: 1.1 K/uL (ref 0.7–4.0)
MCHC: 32.8 g/dL (ref 30.0–36.0)
MCV: 77.9 fl — ABNORMAL LOW (ref 78.0–100.0)
Monocytes Absolute: 0.7 K/uL (ref 0.1–1.0)
Monocytes Relative: 14.8 % — ABNORMAL HIGH (ref 3.0–12.0)
Neutro Abs: 3.1 K/uL (ref 1.4–7.7)
Neutrophils Relative %: 60.3 % (ref 43.0–77.0)
Platelets: 140 K/uL — ABNORMAL LOW (ref 150.0–400.0)
RBC: 5.01 Mil/uL (ref 4.22–5.81)
RDW: 21 % — ABNORMAL HIGH (ref 11.5–14.6)
WBC: 5.1 K/uL (ref 4.5–10.5)

## 2012-09-11 LAB — HM DIABETES FOOT EXAM: HM Diabetic Foot Exam: NORMAL

## 2012-09-11 NOTE — Assessment & Plan Note (Signed)
Plain film shows DJD 

## 2012-09-11 NOTE — Progress Notes (Signed)
Subjective:    Patient ID: [name], male    DOB: Dec 03, 1940, 72 y.o.   MRN: [id number]  Hypertension This is a chronic problem. The current episode started more than 1 year ago. The problem has been gradually improving since onset. The problem is controlled. Pertinent negatives include no anxiety, blurred vision, chest pain, headaches, malaise/fatigue, neck pain, orthopnea, palpitations, peripheral edema, PND, shortness of breath or sweats. Past treatments include ACE inhibitors, diuretics and beta blockers. The current treatment provides significant improvement. There are no compliance problems.  Hypertensive end-organ damage includes kidney disease and heart failure. Identifiable causes of hypertension include chronic renal disease.      Review of Systems  Constitutional: Negative.  Negative for fever, chills, malaise/fatigue, diaphoresis, activity change, appetite change, fatigue and unexpected weight change.  HENT: Negative.  Negative for neck pain.   Eyes: Negative.  Negative for blurred vision.  Respiratory: Negative.  Negative for cough, chest tightness, shortness of breath, wheezing and stridor.   Cardiovascular: Negative.  Negative for chest pain, palpitations, orthopnea, leg swelling and PND.  Gastrointestinal: Negative.  Negative for nausea, vomiting, abdominal pain, diarrhea, constipation and anal bleeding.  Endocrine: Negative.  Negative for polydipsia, polyphagia and polyuria.  Genitourinary: Negative.  Negative for frequency.  Musculoskeletal: Positive for arthralgias (right ring finger pain). Negative for myalgias, back pain, joint swelling and gait problem.  Skin: Negative.  Negative for color change, pallor, rash and wound.  Allergic/Immunologic: Negative.   Neurological: Positive for dizziness. Negative for tremors, seizures, speech difficulty, weakness, light-headedness, numbness and headaches.  Hematological: Negative.  Negative for adenopathy. Does not bruise/bleed  easily.  Psychiatric/Behavioral: Negative.        Objective:   Physical Exam  Vitals reviewed. Constitutional: He is oriented to person, place, and time. He appears well-developed and well-nourished. No distress.  HENT:  Head: Normocephalic and atraumatic.  Mouth/Throat: Oropharynx is clear and moist. No oropharyngeal exudate.  Eyes: Conjunctivae are normal. Right eye exhibits no discharge. Left eye exhibits no discharge. No scleral icterus.  Neck: Normal range of motion. Neck supple. No JVD present. No tracheal deviation present. No thyromegaly present.  Cardiovascular: Normal rate, regular rhythm, S1 normal, S2 normal and intact distal pulses.   Extrasystoles are present. Exam reveals no gallop, no S3, no S4 and no friction rub.   Murmur heard.  Decrescendo systolic murmur is present with a grade of 1/6   No diastolic murmur is present  Pulses:      Carotid pulses are 1+ on the right side, and 1+ on the left side.      Radial pulses are 1+ on the right side, and 1+ on the left side.       Femoral pulses are 1+ on the right side, and 1+ on the left side.      Popliteal pulses are 1+ on the right side, and 1+ on the left side.       Dorsalis pedis pulses are 1+ on the right side, and 1+ on the left side.       Posterior tibial pulses are 1+ on the right side, and 1+ on the left side.  Pulmonary/Chest: Effort normal and breath sounds normal. No stridor. No respiratory distress. He has no wheezes. He has no rales. He exhibits no tenderness.  Abdominal: Soft. Bowel sounds are normal. He exhibits no distension and no mass. There is no tenderness. There is no rebound and no guarding.  Musculoskeletal: Normal range of motion. He exhibits no edema  and no tenderness.       Right hand: Normal. He exhibits normal range of motion, no tenderness, no bony tenderness, normal capillary refill, no deformity, no laceration and no swelling. Normal sensation noted. Normal strength noted.        Hands: Lymphadenopathy:    He has no cervical adenopathy.  Neurological: He is oriented to person, place, and time.  Skin: Skin is warm and dry. No rash noted. He is not diaphoretic. No erythema. No pallor.  Psychiatric: He has a normal mood and affect. His behavior is normal. Judgment and thought content normal.     Lab Results  Component Value Date   WBC 4.7 03/12/2012   HGB 12.6* 03/12/2012   HCT 39.9 03/12/2012   PLT 171.0 03/12/2012   GLUCOSE 125* 06/26/2012   CHOL 136 03/12/2012   TRIG 78.0 03/12/2012   HDL 43.10 03/12/2012   LDLCALC 77 03/12/2012   ALT 21 03/12/2012   AST 25 03/12/2012   NA 141 06/26/2012   K 4.1 06/26/2012   CL 106 06/26/2012   CREATININE 1.4 06/26/2012   BUN 33* 06/26/2012   CO2 28 06/26/2012   TSH 1.46 03/12/2012   PSA <0.01 ng/mL* 11/09/2008   INR 4.4 RATIO* 06/25/2008   HGBA1C 6.0 03/12/2012   MICROALBUR 14.1* 08/10/2011       Assessment & Plan:

## 2012-09-11 NOTE — Assessment & Plan Note (Signed)
I will check his a1c today and will address if needed 

## 2012-09-11 NOTE — Assessment & Plan Note (Signed)
He has a normal volume status today' Will check his dig level He sees Dr. Johney Frame this Friday

## 2012-09-11 NOTE — Assessment & Plan Note (Signed)
His BP is well controlled Today I will check his lytes and renal function 

## 2012-09-11 NOTE — Assessment & Plan Note (Signed)
I will recheck his renal function today 

## 2012-09-11 NOTE — Patient Instructions (Signed)

## 2012-09-11 NOTE — Assessment & Plan Note (Signed)
Repeat CBC today 

## 2012-09-12 ENCOUNTER — Encounter: Payer: Self-pay | Admitting: Internal Medicine

## 2012-09-13 ENCOUNTER — Encounter: Payer: Self-pay | Admitting: Internal Medicine

## 2012-09-13 ENCOUNTER — Encounter: Payer: Self-pay | Admitting: Cardiology

## 2012-09-13 ENCOUNTER — Ambulatory Visit (INDEPENDENT_AMBULATORY_CARE_PROVIDER_SITE_OTHER): Payer: Medicare Other | Admitting: Cardiology

## 2012-09-13 ENCOUNTER — Encounter: Payer: Medicare Other | Admitting: Internal Medicine

## 2012-09-13 VITALS — BP 134/82 | HR 78 | Ht 69.0 in | Wt 198.0 lb

## 2012-09-13 DIAGNOSIS — I059 Rheumatic mitral valve disease, unspecified: Secondary | ICD-10-CM

## 2012-09-13 DIAGNOSIS — Z95810 Presence of automatic (implantable) cardiac defibrillator: Secondary | ICD-10-CM

## 2012-09-13 DIAGNOSIS — I472 Ventricular tachycardia, unspecified: Secondary | ICD-10-CM

## 2012-09-13 DIAGNOSIS — I428 Other cardiomyopathies: Secondary | ICD-10-CM

## 2012-09-13 DIAGNOSIS — I340 Nonrheumatic mitral (valve) insufficiency: Secondary | ICD-10-CM

## 2012-09-13 DIAGNOSIS — I5022 Chronic systolic (congestive) heart failure: Secondary | ICD-10-CM

## 2012-09-13 LAB — ICD DEVICE OBSERVATION
BRDY-0002RV: 40 {beats}/min
DEV-0020ICD: NEGATIVE
DEVICE MODEL ICD: 665247
RV LEAD AMPLITUDE: 12 mv
RV LEAD IMPEDENCE ICD: 380 Ohm
RV LEAD THRESHOLD: 1.25 V
VENTRICULAR PACING ICD: 0 pct
VF: 0

## 2012-09-13 NOTE — Progress Notes (Signed)
ELECTROPHYSIOLOGY OFFICE NOTE  Patient ID: [name] MRN: [id number], DOB/AGE: Nov 04, 1940   Date of Visit: 09/13/2012  Primary Physician: Sanda Linger, MD Primary Cardiologist: Valera Castle, MD Primary EP: Hillis Range, MD Reason for Visit: EP/device follow-up  History of Present Illness  Fuller L Eddleman is a 72 year old man with a NICM s/p ICD implant for primary prevention SCD, NSVT, chronic systolic HF and MR who presents today for routine electrophysiology followup. Since last being seen in our clinic, he reports he is doing well. He has no complaints and states he feels great. Today, he specifically denies chest pain or shortness of breath. He denies palpitations, dizziness, near syncope or syncope. He denies LE swelling, orthopnea, PND or recent weight gain. Mr. Merolla reports  he is compliant and tolerating medications without difficulty.  Past Medical History Past Medical History  Diagnosis Date  . AICD (automatic cardioverter/defibrillator) present   . Obesity   . Prostate cancer   . Glucose intolerance (impaired glucose tolerance)   . Bursitis of elbow     left  . Osteoarthritis   . HTN (hypertension)   . HLD (hyperlipidemia)   . History of DVT (deep vein thrombosis)   . Chronic systolic heart failure     echo 4/13: EF 25-30%, mod MR, mod LAE, PASP 47  . NICM (nonischemic cardiomyopathy)     normal cors by cath in 1990s;  nuclear 8/08: no ischemia; inf/lat/dist ant/apical scar  . Moderate mitral regurgitation     echo 4/13    Past Surgical History Past Surgical History  Procedure Laterality Date  . Transurethral resection of prostate    . Cardiac defibrillator placement  10/09  . Hernia repair    . Robot assisted laparoscopic radical prostatectomy  6/08     Allergies/Intolerances Allergies  Allergen Reactions  . Spironolactone     Current Home Medications Current Outpatient Prescriptions  Medication Sig Dispense Refill  . allopurinol (ZYLOPRIM) 100 MG tablet  Take 100 mg by mouth daily.       Marland Kitchen aspirin 81 MG tablet Take 81 mg by mouth daily.        . carvedilol (COREG) 25 MG tablet Take 25 mg by mouth 2 (two) times daily with a meal.        . colchicine (COLCRYS) 0.6 MG tablet Take 1 tablet (0.6 mg total) by mouth 2 (two) times daily.  180 tablet  3  . digoxin (LANOXIN) 0.125 MG tablet Take 125 mcg by mouth daily.        . Febuxostat (ULORIC) 80 MG TABS Take 1 tablet (80 mg total) by mouth daily.  90 tablet  3  . Ferrous Sulfate Dried (FEOSOL) 200 (65 FE) MG TABS Take by mouth 2 (two) times daily with a meal.        . furosemide (LASIX) 40 MG tablet Take 40 mg by mouth daily. Take daily as directed      . HYDROcodone-acetaminophen (NORCO) 10-325 MG per tablet Take 1 tablet by mouth every 8 (eight) hours as needed for pain.  50 tablet  2  . lisinopril (PRINIVIL,ZESTRIL) 5 MG tablet Take 1 tablet (5 mg total) by mouth daily.  30 tablet  11  . PRECISION XTRA TEST STRIPS test strip as directed.      . simvastatin (ZOCOR) 20 MG tablet Take 10 mg by mouth at bedtime.        . TRADJENTA 5 MG TABS tablet TAKE 1 TABLET BY MOUTH DAILY  FOR DIABETES  30 tablet  5   No current facility-administered medications for this visit.    Social History Social History  . Marital Status: Married   Occupational History  . Paramedic    Social History Main Topics  . Smoking status: Former Smoker -- 0.50 packs/day for 10 years    Types: Cigarettes    Quit date: 11/01/1975  . Smokeless tobacco: Never Used     Comment: quit over 30 years ago   . Alcohol Use: No     Comment: occasionally  . Drug Use: No   Review of Systems General: No chills, fever, night sweats or weight changes Cardiovascular: No chest pain, dyspnea on exertion, edema, orthopnea, palpitations, paroxysmal nocturnal dyspnea Dermatological: No rash, lesions or masses Respiratory: No cough, dyspnea Urologic: No hematuria, dysuria Abdominal: No nausea, vomiting, diarrhea, bright red blood per  rectum, melena, or hematemesis Neurologic: No visual changes, weakness, changes in mental status All other systems reviewed and are otherwise negative except as noted above.  Physical Exam Blood pressure 134/82, pulse 78, height 5\' 9"  (1.753 m), weight 198 lb (89.812 kg).  General: Well developed, well appearing 72 year old male in no acute distress. HEENT: Normocephalic, atraumatic. EOMs intact. Sclera nonicteric. Oropharynx clear.  Neck: Supple. No JVD. Lungs: Respirations regular and unlabored, CTA bilaterally. No wheezes, rales or rhonchi. Heart: RRR. S1, S2 present. High pitched II/VI systolic murmur noted. No rub, S3 or S4. Abdomen: Soft, non-tender, non-distended. BS present x 4 quadrants. No hepatosplenomegaly.  Extremities: No clubbing, cyanosis or edema. DP/PT/Radials 2+ and equal bilaterally. Psych: Normal affect. Neuro: Alert and oriented X 3. Moves all extremities spontaneously.   Diagnostics Echocardiogram - April 2013 Study Conclusions - Left ventricle: The cavity size was moderately dilated. Wall thickness was normal. Systolic function was severely reduced. The estimated ejection fraction was in the range of 25% to 30%. Diffuse hypokinesis. Left ventricular diastolic function parameters were normal. - Mitral valve: Moderate regurgitation. - Left atrium: The atrium was moderately dilated. - Pulmonary arteries: Systolic pressure was mildly to moderately increased. PA peak pressure: 47mm Hg (S).   Device interrogation today - Normal device function. Threshold and sensing consistent with previous device measurements. Impedance trends stable over time. No evidence of any ventricular arrhythmias. Histogram distribution appropriate for patient and level of activity. No changes made this session. Device programmed at appropriate safety margins. Device programmed to optimize intrinsic conduction. Estimated longevity 4.6 years.   Assessment and Plan 1. NICM s/p ICD implant for  primary prevention SCD Normal ICD function No episodes  No programming changes made Continue medical therapy 2. NSVT Seen on prior device interrogations No episodes today Continue BB 3. Chronic systolic HF Euvolemic by exam Continue medical therapy 3. Mitral regurgitation Moderate by echo April 2013 Followed by Dr. Daleen Squibb  Signed, Takoya Jonas, PA-C 09/13/2012, 11:31 AM

## 2012-09-13 NOTE — Patient Instructions (Signed)
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 This monitoring reduces the number of office visits required to check your device to one time per year. It allows [place] to keep an eye on the functioning of your device to ensure it is working properly. You are scheduled for a device check from home on 12/16/2012. You may send your transmission at any time that day. If you have a wireless device, the transmission will be sent automatically. After your physician reviews your transmission, you will receive a postcard with your next transmission date.  Your physician wants you to follow-up in: 12 months with Dr. Jacquiline Doe will receive a reminder letter in the mail two months in advance. If you don't receive a letter, please call our office to schedule the follow-up appointment.

## 2012-10-17 ENCOUNTER — Other Ambulatory Visit: Payer: Self-pay | Admitting: Internal Medicine

## 2012-11-25 ENCOUNTER — Encounter: Payer: Self-pay | Admitting: Internal Medicine

## 2012-11-25 ENCOUNTER — Ambulatory Visit (INDEPENDENT_AMBULATORY_CARE_PROVIDER_SITE_OTHER): Payer: Medicare Other | Admitting: Internal Medicine

## 2012-11-25 VITALS — BP 110/68 | HR 86 | Temp 97.6°F | Ht 69.0 in | Wt 195.0 lb

## 2012-11-25 DIAGNOSIS — M109 Gout, unspecified: Secondary | ICD-10-CM

## 2012-11-25 MED ORDER — PREDNISONE 10 MG PO TABS: ORAL_TABLET | ORAL | Status: DC

## 2012-11-25 NOTE — Progress Notes (Signed)
Subjective:    Patient ID: [name], male    DOB: [birth year], 72 y.o.   MRN: [id number]  HPI  Pt presents to the clinic today with c/o right wrist pain and swelling. This started on Thursday and seemed to get progressively worse over the weekend. He does have a history of gout. He has been taking his uloric and cholchcine as directed. He reports he has not eaten red meats or other foods high in purines. He has eaten more chicken than normal. He denies any injury to the area.  Review of Systems      Past Medical History  Diagnosis Date  . AICD (automatic cardioverter/defibrillator) present   . Obesity   . Prostate cancer   . Glucose intolerance (impaired glucose tolerance)   . Bursitis of elbow     left  . Osteoarthritis   . HTN (hypertension)   . HLD (hyperlipidemia)   . History of DVT (deep vein thrombosis)   . Chronic systolic heart failure     echo 4/13: EF 25-30%, mod MR, mod LAE, PASP 47  . NICM (nonischemic cardiomyopathy)     normal cors by cath in 1990s;  nuclear 8/08: no ischemia; inf/lat/dist ant/apical scar  . Moderate mitral regurgitation     echo 4/13    Current Outpatient Prescriptions  Medication Sig Dispense Refill  . allopurinol (ZYLOPRIM) 100 MG tablet Take 100 mg by mouth daily.       Marland Kitchen aspirin 81 MG tablet Take 81 mg by mouth daily.        . carvedilol (COREG) 25 MG tablet Take 25 mg by mouth 2 (two) times daily with a meal.        . colchicine (COLCRYS) 0.6 MG tablet Take 1 tablet (0.6 mg total) by mouth 2 (two) times daily.  180 tablet  3  . digoxin (LANOXIN) 0.125 MG tablet Take 125 mcg by mouth daily.        . Febuxostat (ULORIC) 80 MG TABS Take 1 tablet (80 mg total) by mouth daily.  90 tablet  3  . Ferrous Sulfate Dried (FEOSOL) 200 (65 FE) MG TABS Take by mouth 2 (two) times daily with a meal.        . furosemide (LASIX) 40 MG tablet Take 40 mg by mouth daily. Take daily as directed      . HYDROcodone-acetaminophen (NORCO) 10-325 MG per tablet  Take 1 tablet by mouth every 8 (eight) hours as needed for pain.  50 tablet  2  . lisinopril (PRINIVIL,ZESTRIL) 5 MG tablet Take 1 tablet (5 mg total) by mouth daily.  30 tablet  11  . PRECISION XTRA TEST STRIPS test strip as directed.      . simvastatin (ZOCOR) 20 MG tablet Take 10 mg by mouth at bedtime.        . TRADJENTA 5 MG TABS tablet TAKE 1 TABLET BY MOUTH DAILY FOR DIABETES  30 tablet  2   No current facility-administered medications for this visit.    Allergies  Allergen Reactions  . Spironolactone     Family History  Problem Relation Age of Onset  . Cancer Father     prostate  . Heart disease Mother     History   Social History  . Marital Status: Married    Spouse Name: N/A    Number of Children: N/A  . Years of Education: N/A   Occupational History  . Paramedic    Social History Main Topics  .  Smoking status: Former Smoker -- 0.50 packs/day for 10 years    Types: Cigarettes    Quit date: 11/01/1975  . Smokeless tobacco: Never Used     Comment: quit over 30 years ago   . Alcohol Use: No     Comment: occasionally  . Drug Use: No  . Sexually Active: Not Currently   Other Topics Concern  . Not on file   Social History Narrative  . No narrative on file     Constitutional: Denies fever, malaise, fatigue, headache or abrupt weight changes.  Musculoskeletal: Pt reports right wrist pain. Denies decrease in range of motion, difficulty with gait, muscle pain.  Skin: Denies redness, rashes, lesions or ulcercations.    No other specific complaints in a complete review of systems (except as listed in HPI above).   Objective:   Physical Exam   BP 110/68  Pulse 86  Temp(Src) 97.6 F (36.4 C) (Oral)  Ht 5\' 9"  (1.753 m)  Wt 195 lb (88.451 kg)  BMI 28.78 kg/m2  SpO2 98% Wt Readings from Last 3 Encounters:  11/25/12 195 lb (88.451 kg)  09/13/12 198 lb (89.812 kg)  09/11/12 198 lb (89.812 kg)    General: Appears his stated age, well developed,  well nourished in NAD. Skin: Warmth and redness noted of right wrist. Cardiovascular: Normal rate and rhythm. S1,S2 noted.  No murmur, rubs or gallops noted. No JVD or BLE edema. No carotid bruits noted. Pulmonary/Chest: Normal effort and positive vesicular breath sounds. No respiratory distress. No wheezes, rales or ronchi noted.  Musculoskeletal: Decreased flexion and extension of the right wrist secondary to pain and swelling. 2+ non pitting edema of right wrist and hand. Tender to palpation.   BMET    Component Value Date/Time   NA 141 09/11/2012 1057   K 4.0 09/11/2012 1057   CL 108 09/11/2012 1057   CO2 25 09/11/2012 1057   GLUCOSE 121* 09/11/2012 1057   BUN 14 09/11/2012 1057   CREATININE 1.1 09/11/2012 1057   CREATININE 1.48* 12/15/2011 1621   CALCIUM 9.4 09/11/2012 1057   GFRNONAA 69.86 04/26/2010 1041   GFRAA  Value: 57        The eGFR has been calculated using the MDRD equation. This calculation has not been validated in all clinical situations. eGFR's persistently <60 mL/min signify possible Chronic Kidney Disease.* 06/22/2008 0527    Lipid Panel     Component Value Date/Time   CHOL 136 03/12/2012 1040   TRIG 78.0 03/12/2012 1040   HDL 43.10 03/12/2012 1040   CHOLHDL 3 03/12/2012 1040   VLDL 15.6 03/12/2012 1040   LDLCALC 77 03/12/2012 1040    CBC    Component Value Date/Time   WBC 5.1 09/11/2012 1057   RBC 5.01 09/11/2012 1057   HGB 12.8* 09/11/2012 1057   HCT 39.0 09/11/2012 1057   PLT 140.0* 09/11/2012 1057   MCV 77.9* 09/11/2012 1057   MCHC 32.8 09/11/2012 1057   RDW 21.0* 09/11/2012 1057   LYMPHSABS 1.1 09/11/2012 1057   MONOABS 0.7 09/11/2012 1057   EOSABS 0.1 09/11/2012 1057   BASOSABS 0.0 09/11/2012 1057    Hgb A1C Lab Results  Component Value Date   HGBA1C 6.4 09/11/2012        Assessment & Plan:   Acute gouty arthropathy, recurrent:  Continue colchicine and uloric Pt declines Depo shot today reporting that the last time he had it for gout it did not help at  all Will try prednisone taper Will  refer to rheumatology due to recurrent episodes  RTC as needed or if symptoms persist or worsen

## 2012-11-25 NOTE — Patient Instructions (Signed)
Gout  Gout is an inflammatory condition (arthritis) caused by a buildup of uric acid crystals in the joints. Uric acid is a chemical that is normally present in the blood. Under some circumstances, uric acid can form into crystals in your joints. This causes joint redness, soreness, and swelling (inflammation). Repeat attacks are common. Over time, uric acid crystals can form into masses (tophi) near a joint, causing disfigurement. Gout is treatable and often preventable.  CAUSES   The disease begins with elevated levels of uric acid in the blood. Uric acid is produced by your body when it breaks down a naturally found substance called purines. This also happens when you eat certain foods such as meats and fish. Causes of an elevated uric acid level include:   Being passed down from parent to child (heredity).   Diseases that cause increased uric acid production (obesity, psoriasis, some cancers).   Excessive alcohol use.   Diet, especially diets rich in meat and seafood.   Medicines, including certain cancer-fighting drugs (chemotherapy), diuretics, and aspirin.   Chronic kidney disease. The kidneys are no longer able to remove uric acid well.   Problems with metabolism.  Conditions strongly associated with gout include:   Obesity.   High blood pressure.   High cholesterol.   Diabetes.  Not everyone with elevated uric acid levels gets gout. It is not understood why some people get gout and others do not. Surgery, joint injury, and eating too much of certain foods are some of the factors that can lead to gout.  SYMPTOMS    An attack of gout comes on quickly. It causes intense pain with redness, swelling, and warmth in a joint.   Fever can occur.   Often, only one joint is involved. Certain joints are more commonly involved:   Base of the big toe.   Knee.   Ankle.   Wrist.   Finger.  Without treatment, an attack usually goes away in a few days to weeks. Between attacks, you usually will not have  symptoms, which is different from many other forms of arthritis.  DIAGNOSIS   Your caregiver will suspect gout based on your symptoms and exam. Removal of fluid from the joint (arthrocentesis) is done to check for uric acid crystals. Your caregiver will give you a medicine that numbs the area (local anesthetic) and use a needle to remove joint fluid for exam. Gout is confirmed when uric acid crystals are seen in joint fluid, using a special microscope. Sometimes, blood, urine, and X-ray tests are also used.  TREATMENT   There are 2 phases to gout treatment: treating the sudden onset (acute) attack and preventing attacks (prophylaxis).  Treatment of an Acute Attack   Medicines are used. These include anti-inflammatory medicines or steroid medicines.   An injection of steroid medicine into the affected joint is sometimes necessary.   The painful joint is rested. Movement can worsen the arthritis.   You may use warm or cold treatments on painful joints, depending which works best for you.   Discuss the use of coffee, vitamin C, or cherries with your caregiver. These may be helpful treatment options.  Treatment to Prevent Attacks  After the acute attack subsides, your caregiver may advise prophylactic medicine. These medicines either help your kidneys eliminate uric acid from your body or decrease your uric acid production. You may need to stay on these medicines for a very long time.  The early phase of treatment with prophylactic medicine can be associated   with an increase in acute gout attacks. For this reason, during the first few months of treatment, your caregiver may also advise you to take medicines usually used for acute gout treatment. Be sure you understand your caregiver's directions.  You should also discuss dietary treatment with your caregiver. Certain foods such as meats and fish can increase uric acid levels. Other foods such as dairy can decrease levels. Your caregiver can give you a list of foods  to avoid.  HOME CARE INSTRUCTIONS    Do not take aspirin to relieve pain. This raises uric acid levels.   Only take over-the-counter or prescription medicines for pain, discomfort, or fever as directed by your caregiver.   Rest the joint as much as possible. When in bed, keep sheets and blankets off painful areas.   Keep the affected joint raised (elevated).   Use crutches if the painful joint is in your leg.   Drink enough water and fluids to keep your urine clear or pale yellow. This helps your body get rid of uric acid. Do not drink alcoholic beverages. They slow the passage of uric acid.   Follow your caregiver's dietary instructions. Pay careful attention to the amount of protein you eat. Your daily diet should emphasize fruits, vegetables, whole grains, and fat-free or low-fat milk products.   Maintain a healthy body weight.  SEEK MEDICAL CARE IF:    You have an oral temperature above 102 F (38.9 C).   You develop diarrhea, vomiting, or any side effects from medicines.   You do not feel better in 24 hours, or you are getting worse.  SEEK IMMEDIATE MEDICAL CARE IF:    Your joint becomes suddenly more tender and you have:   Chills.   An oral temperature above 102 F (38.9 C), not controlled by medicine.  MAKE SURE YOU:    Understand these instructions.   Will watch your condition.   Will get help right away if you are not doing well or get worse.  Document Released: 06/02/2000 Document Revised: 08/28/2011 Document Reviewed: 09/13/2009  [employer] Patient Information 2014 [employer].

## 2012-12-16 ENCOUNTER — Ambulatory Visit (INDEPENDENT_AMBULATORY_CARE_PROVIDER_SITE_OTHER): Payer: Medicare Other | Admitting: *Deleted

## 2012-12-16 ENCOUNTER — Encounter: Payer: Self-pay | Admitting: Internal Medicine

## 2012-12-16 DIAGNOSIS — I472 Ventricular tachycardia, unspecified: Secondary | ICD-10-CM

## 2012-12-16 DIAGNOSIS — I4729 Other ventricular tachycardia: Secondary | ICD-10-CM

## 2012-12-16 DIAGNOSIS — Z95810 Presence of automatic (implantable) cardiac defibrillator: Secondary | ICD-10-CM

## 2012-12-18 LAB — REMOTE ICD DEVICE
BATTERY VOLTAGE: 2.8 V
BRDY-0002RV: 40 {beats}/min
HV IMPEDENCE: 49 Ohm
RV LEAD IMPEDENCE ICD: 380 Ohm
VENTRICULAR PACING ICD: 1 pct

## 2012-12-24 ENCOUNTER — Encounter: Payer: Self-pay | Admitting: *Deleted

## 2012-12-25 ENCOUNTER — Ambulatory Visit (INDEPENDENT_AMBULATORY_CARE_PROVIDER_SITE_OTHER): Payer: Medicare Other | Admitting: *Deleted

## 2012-12-25 DIAGNOSIS — I5022 Chronic systolic (congestive) heart failure: Secondary | ICD-10-CM

## 2012-12-25 DIAGNOSIS — I472 Ventricular tachycardia: Secondary | ICD-10-CM

## 2012-12-25 DIAGNOSIS — I509 Heart failure, unspecified: Secondary | ICD-10-CM

## 2012-12-25 LAB — ICD DEVICE OBSERVATION: DEVICE MODEL ICD: 665247

## 2012-12-25 NOTE — Progress Notes (Signed)
ICD /VF intervals reprogrammed per Dr. Johney Frame.

## 2013-01-03 ENCOUNTER — Encounter: Payer: Self-pay | Admitting: Internal Medicine

## 2013-01-14 ENCOUNTER — Other Ambulatory Visit (INDEPENDENT_AMBULATORY_CARE_PROVIDER_SITE_OTHER): Payer: Medicare Other

## 2013-01-14 ENCOUNTER — Ambulatory Visit (INDEPENDENT_AMBULATORY_CARE_PROVIDER_SITE_OTHER): Payer: Medicare Other | Admitting: Internal Medicine

## 2013-01-14 ENCOUNTER — Encounter: Payer: Self-pay | Admitting: Internal Medicine

## 2013-01-14 VITALS — BP 110/72 | HR 80 | Temp 97.0°F | Resp 16 | Wt 201.0 lb

## 2013-01-14 DIAGNOSIS — M109 Gout, unspecified: Secondary | ICD-10-CM

## 2013-01-14 DIAGNOSIS — E1129 Type 2 diabetes mellitus with other diabetic kidney complication: Secondary | ICD-10-CM

## 2013-01-14 DIAGNOSIS — E785 Hyperlipidemia, unspecified: Secondary | ICD-10-CM

## 2013-01-14 DIAGNOSIS — I10 Essential (primary) hypertension: Secondary | ICD-10-CM

## 2013-01-14 DIAGNOSIS — E1165 Type 2 diabetes mellitus with hyperglycemia: Secondary | ICD-10-CM

## 2013-01-14 LAB — COMPREHENSIVE METABOLIC PANEL
ALT: 35 U/L (ref 0–53)
Alkaline Phosphatase: 51 U/L (ref 39–117)
Creatinine, Ser: 1.2 mg/dL (ref 0.4–1.5)
Sodium: 140 mEq/L (ref 135–145)
Total Bilirubin: 1.3 mg/dL — ABNORMAL HIGH (ref 0.3–1.2)
Total Protein: 6.9 g/dL (ref 6.0–8.3)

## 2013-01-14 LAB — HEMOGLOBIN A1C: Hgb A1c MFr Bld: 6.3 % (ref 4.6–6.5)

## 2013-01-14 LAB — LIPID PANEL
HDL: 44.9 mg/dL (ref >39.00)
LDL Cholesterol: 75 mg/dL (ref 0–99)
VLDL: 21.2 mg/dL (ref 0.0–40.0)

## 2013-01-14 NOTE — Assessment & Plan Note (Signed)
He is doing well on zocor He has achieved the LDL goal

## 2013-01-14 NOTE — Assessment & Plan Note (Signed)
His BP is well controlled 

## 2013-01-14 NOTE — Patient Instructions (Signed)
Type 2 Diabetes Mellitus, Adult Type 2 diabetes mellitus, often simply referred to as type 2 diabetes, is a long-lasting (chronic) disease. In type 2 diabetes, the pancreas does not make enough insulin (a hormone), the cells are less responsive to the insulin that is made (insulin resistance), or both. Normally, insulin moves sugars from food into the tissue cells. The tissue cells use the sugars for energy. The lack of insulin or the lack of normal response to insulin causes excess sugars to build up in the blood instead of going into the tissue cells. As a result, high blood sugar (hyperglycemia) develops. The effect of high sugar (glucose) levels can cause many complications. Type 2 diabetes was also previously called adult-onset diabetes but it can occur at any age.  RISK FACTORS  A person is predisposed to developing type 2 diabetes if someone in the family has the disease and also has one or more of the following primary risk factors:  Overweight.  An inactive lifestyle.  A history of consistently eating high-calorie foods. Maintaining a normal weight and regular physical activity can reduce the chance of developing type 2 diabetes. SYMPTOMS  A person with type 2 diabetes may not show symptoms initially. The symptoms of type 2 diabetes appear slowly. The symptoms include:  Increased thirst (polydipsia).  Increased urination (polyuria).  Increased urination during the night (nocturia).  Weight loss. This weight loss may be rapid.  Frequent, recurring infections.  Tiredness (fatigue).  Weakness.  Vision changes, such as blurred vision.  Fruity smell to your breath.  Abdominal pain.  Nausea or vomiting.  Cuts or bruises which are slow to heal.  Tingling or numbness in the hands or feet. DIAGNOSIS Type 2 diabetes is frequently not diagnosed until complications of diabetes are present. Type 2 diabetes is diagnosed when symptoms or complications are present and when blood  glucose levels are increased. Your blood glucose level may be checked by one or more of the following blood tests:  A fasting blood glucose test. You will not be allowed to eat for at least 8 hours before a blood sample is taken.  A random blood glucose test. Your blood glucose is checked at any time of the day regardless of when you ate.  A hemoglobin A1c blood glucose test. A hemoglobin A1c test provides information about blood glucose control over the previous 3 months.  An oral glucose tolerance test (OGTT). Your blood glucose is measured after you have not eaten (fasted) for 2 hours and then after you drink a glucose-containing beverage. TREATMENT   You may need to take insulin or diabetes medicine daily to keep blood glucose levels in the desired range.  You will need to match insulin dosing with exercise and healthy food choices. The treatment goal is to maintain the before meal blood sugar (preprandial glucose) level at 70 130 mg/dL. HOME CARE INSTRUCTIONS   Have your hemoglobin A1c level checked twice a year.  Perform daily blood glucose monitoring as directed by your caregiver.  Monitor urine ketones when you are ill and as directed by your caregiver.  Take your diabetes medicine or insulin as directed by your caregiver to maintain your blood glucose levels in the desired range.  Never run out of diabetes medicine or insulin. It is needed every day.  Adjust insulin based on your intake of carbohydrates. Carbohydrates can raise blood glucose levels but need to be included in your diet. Carbohydrates provide vitamins, minerals, and fiber which are an essential part of   a healthy diet. Carbohydrates are found in fruits, vegetables, whole grains, dairy products, legumes, and foods containing added sugars.    Eat healthy foods. Alternate 3 meals with 3 snacks.  Lose weight if overweight.  Carry a medical alert card or wear your medical alert jewelry.  Carry a 15 gram  carbohydrate snack with you at all times to treat low blood glucose (hypoglycemia). Some examples of 15 gram carbohydrate snacks include:  Glucose tablets, 3 or 4   Glucose gel, 15 gram tube  Raisins, 2 tablespoons (24 grams)  Jelly beans, 6  Animal crackers, 8  Regular pop, 4 ounces (120 mL)  Gummy treats, 9  Recognize hypoglycemia. Hypoglycemia occurs with blood glucose levels of 70 mg/dL and below. The risk for hypoglycemia increases when fasting or skipping meals, during or after intense exercise, and during sleep. Hypoglycemia symptoms can include:  Tremors or shakes.  Decreased ability to concentrate.  Sweating.  Increased heart rate.  Headache.  Dry mouth.  Hunger.  Irritability.  Anxiety.  Restless sleep.  Altered speech or coordination.  Confusion.  Treat hypoglycemia promptly. If you are alert and able to safely swallow, follow the 15:15 rule:  Take 15 20 grams of rapid-acting glucose or carbohydrate. Rapid-acting options include glucose gel, glucose tablets, or 4 ounces (120 mL) of fruit juice, regular soda, or low fat milk.  Check your blood glucose level 15 minutes after taking the glucose.  Take 15 20 grams more of glucose if the repeat blood glucose level is still 70 mg/dL or below.  Eat a meal or snack within 1 hour once blood glucose levels return to normal.    Be alert to polyuria and polydipsia which are early signs of hyperglycemia. An early awareness of hyperglycemia allows for prompt treatment. Treat hyperglycemia as directed by your caregiver.  Engage in at least 150 minutes of moderate-intensity physical activity a week, spread over at least 3 days of the week or as directed by your caregiver. In addition, you should engage in resistance exercise at least 2 times a week or as directed by your caregiver.  Adjust your medicine and food intake as needed if you start a new exercise or sport.  Follow your sick day plan at any time you  are unable to eat or drink as usual.  Avoid tobacco use.  Limit alcohol intake to no more than 1 drink per day for nonpregnant women and 2 drinks per day for men. You should drink alcohol only when you are also eating food. Talk with your caregiver whether alcohol is safe for you. Tell your caregiver if you drink alcohol several times a week.  Follow up with your caregiver regularly.  Schedule an eye exam soon after the diagnosis of type 2 diabetes and then annually.  Perform daily skin and foot care. Examine your skin and feet daily for cuts, bruises, redness, nail problems, bleeding, blisters, or sores. A foot exam by a caregiver should be done annually.  Brush your teeth and gums at least twice a day and floss at least once a day. Follow up with your dentist regularly.  Share your diabetes management plan with your workplace or school.  Stay up-to-date with immunizations.  Learn to manage stress.  Obtain ongoing diabetes education and support as needed.  Participate in, or seek rehabilitation as needed to maintain or improve independence and quality of life. Request a physical or occupational therapy referral if you are having foot or hand numbness or difficulties with grooming,   dressing, eating, or physical activity. SEEK MEDICAL CARE IF:   You are unable to eat food or drink fluids for more than 6 hours.  You have nausea and vomiting for more than 6 hours.  Your blood glucose level is over 240 mg/dL.  There is a change in mental status.  You develop an additional serious illness.  You have diarrhea for more than 6 hours.  You have been sick or have had a fever for a couple of days and are not getting better.  You have pain during any physical activity.  SEEK IMMEDIATE MEDICAL CARE IF:  You have difficulty breathing.  You have moderate to large ketone levels. MAKE SURE YOU:  Understand these instructions.  Will watch your condition.  Will get help right away if  you are not doing well or get worse. Document Released: 06/05/2005 Document Revised: 02/28/2012 Document Reviewed: 01/02/2012 [employer] Patient Information 2014 [employer].  

## 2013-01-14 NOTE — Assessment & Plan Note (Signed)
His A1C shows good control of the blood sugar His renal function is stable

## 2013-01-14 NOTE — Progress Notes (Signed)
Subjective:    Patient ID: [name], male    DOB: July 19, 1940, 72 y.o.   MRN: [id number]  Diabetes He presents for his follow-up diabetic visit. He has type 2 diabetes mellitus. His disease course has been stable. There are no hypoglycemic associated symptoms. Pertinent negatives for hypoglycemia include no dizziness. Pertinent negatives for diabetes include no blurred vision, no chest pain, no fatigue, no foot ulcerations, no polydipsia, no polyphagia, no polyuria, no visual change, no weakness and no weight loss. There are no hypoglycemic complications. Symptoms are stable. Diabetic complications include heart disease and nephropathy. Current diabetic treatment includes oral agent (monotherapy). He is compliant with treatment all of the time. His weight is stable. He is following a generally healthy diet. Meal planning includes avoidance of concentrated sweets. He participates in exercise intermittently. There is no change in his home blood glucose trend. An ACE inhibitor/angiotensin II receptor blocker is contraindicated. He does not see a podiatrist.Eye exam is current.      Review of Systems  Constitutional: Negative.  Negative for fever, chills, weight loss, diaphoresis and fatigue. Unexpected weight change: some weight gain.  HENT: Negative.   Eyes: Negative.  Negative for blurred vision.  Respiratory: Negative.  Negative for cough, chest tightness, shortness of breath, wheezing and stridor.   Cardiovascular: Negative.  Negative for chest pain, palpitations and leg swelling.  Gastrointestinal: Negative.  Negative for nausea, abdominal pain, diarrhea, constipation and blood in stool.  Endocrine: Negative.  Negative for polydipsia, polyphagia and polyuria.  Genitourinary: Negative.   Musculoskeletal: Negative.  Negative for myalgias and arthralgias.  Skin: Negative.   Allergic/Immunologic: Negative.   Neurological: Negative.  Negative for dizziness, weakness and light-headedness.   Hematological: Negative.  Negative for adenopathy. Does not bruise/bleed easily.  Psychiatric/Behavioral: Negative.        Objective:   Physical Exam  Vitals reviewed. Constitutional: He is oriented to person, place, and time. He appears well-developed and well-nourished.  HENT:  Head: Normocephalic and atraumatic.  Mouth/Throat: Oropharynx is clear and moist. No oropharyngeal exudate.  Eyes: Conjunctivae are normal. Right eye exhibits no discharge. Left eye exhibits no discharge. No scleral icterus.  Neck: Normal range of motion. Neck supple. No JVD present. No tracheal deviation present. No thyromegaly present.  Cardiovascular: Normal rate, regular rhythm and intact distal pulses.  Exam reveals no gallop and no friction rub.   Murmur heard. Pulmonary/Chest: Effort normal and breath sounds normal. No stridor. No respiratory distress. He has no wheezes. He has no rales.  Abdominal: Soft. Bowel sounds are normal. He exhibits no distension and no mass. There is no tenderness. There is no rebound and no guarding.  Musculoskeletal: Normal range of motion. He exhibits no edema and no tenderness.  Lymphadenopathy:    He has no cervical adenopathy.  Neurological: He is oriented to person, place, and time.  Skin: Skin is warm and dry. No rash noted. He is not diaphoretic. No erythema. No pallor.  Psychiatric: He has a normal mood and affect. His behavior is normal. Judgment and thought content normal.      Lab Results  Component Value Date   WBC 5.1 09/11/2012   HGB 12.8* 09/11/2012   HCT 39.0 09/11/2012   PLT 140.0* 09/11/2012   GLUCOSE 121* 09/11/2012   CHOL 136 03/12/2012   TRIG 78.0 03/12/2012   HDL 43.10 03/12/2012   LDLCALC 77 03/12/2012   ALT 21 03/12/2012   AST 25 03/12/2012   NA 141 09/11/2012   K 4.0 09/11/2012  CL 108 09/11/2012   CREATININE 1.1 09/11/2012   BUN 14 09/11/2012   CO2 25 09/11/2012   TSH 1.46 03/12/2012   PSA <0.01 ng/mL* 11/09/2008   INR 4.4 RATIO* 06/25/2008    HGBA1C 6.4 09/11/2012   MICROALBUR 14.1* 08/10/2011      Assessment & Plan:

## 2013-01-16 ENCOUNTER — Other Ambulatory Visit: Payer: Self-pay | Admitting: Internal Medicine

## 2013-03-24 ENCOUNTER — Ambulatory Visit (INDEPENDENT_AMBULATORY_CARE_PROVIDER_SITE_OTHER): Payer: Medicare Other | Admitting: *Deleted

## 2013-03-24 DIAGNOSIS — Z95810 Presence of automatic (implantable) cardiac defibrillator: Secondary | ICD-10-CM

## 2013-03-24 DIAGNOSIS — I420 Dilated cardiomyopathy: Secondary | ICD-10-CM

## 2013-03-24 DIAGNOSIS — I428 Other cardiomyopathies: Secondary | ICD-10-CM

## 2013-03-27 LAB — REMOTE ICD DEVICE
DEV-0020ICD: NEGATIVE
HV IMPEDENCE: 44 Ohm
RV LEAD AMPLITUDE: 12 mv
RV LEAD IMPEDENCE ICD: 340 Ohm

## 2013-03-31 ENCOUNTER — Encounter: Payer: Self-pay | Admitting: Cardiology

## 2013-03-31 ENCOUNTER — Ambulatory Visit (INDEPENDENT_AMBULATORY_CARE_PROVIDER_SITE_OTHER): Payer: Medicare Other | Admitting: Cardiology

## 2013-03-31 VITALS — BP 110/80 | HR 87 | Ht 69.0 in | Wt 199.1 lb

## 2013-03-31 DIAGNOSIS — Z95810 Presence of automatic (implantable) cardiac defibrillator: Secondary | ICD-10-CM

## 2013-03-31 DIAGNOSIS — I10 Essential (primary) hypertension: Secondary | ICD-10-CM

## 2013-03-31 DIAGNOSIS — I428 Other cardiomyopathies: Secondary | ICD-10-CM

## 2013-03-31 DIAGNOSIS — I472 Ventricular tachycardia, unspecified: Secondary | ICD-10-CM

## 2013-03-31 DIAGNOSIS — I420 Dilated cardiomyopathy: Secondary | ICD-10-CM

## 2013-03-31 NOTE — Progress Notes (Signed)
HPI: FU NICM; previously followed by Dr Daleen Squibb; normal coronary arteries by cardiac catheterization in the 1990s, s/p AICD, prior DVT. Nuclear study 8/08: no ischemia, inf/lat/dAnt/apical scar. Echo 09/26/11: EF 25-30%, mod MR, mod LAE, PASP 47. Patient last seen in March of 2014. Since that time, the patient has dyspnea with more extreme activities but not with routine activities. It is relieved with rest. It is not associated with chest pain. There is no orthopnea, PND or pedal edema. There is no syncope or palpitations. There is no exertional chest pain.     Current Outpatient Prescriptions  Medication Sig Dispense Refill  . aspirin 81 MG tablet Take 81 mg by mouth daily.        . carvedilol (COREG) 25 MG tablet Take 25 mg by mouth 2 (two) times daily with a meal.        . colchicine (COLCRYS) 0.6 MG tablet Take 1 tablet (0.6 mg total) by mouth 2 (two) times daily.  180 tablet  3  . digoxin (LANOXIN) 0.125 MG tablet Take 125 mcg by mouth daily.        . Febuxostat (ULORIC) 80 MG TABS Take 1 tablet (80 mg total) by mouth daily.  90 tablet  3  . furosemide (LASIX) 40 MG tablet Take 40 mg by mouth daily. Take daily as directed      . HYDROcodone-acetaminophen (NORCO) 10-325 MG per tablet Take 1 tablet by mouth every 8 (eight) hours as needed for pain.  50 tablet  2  . lisinopril (PRINIVIL,ZESTRIL) 5 MG tablet Take 1 tablet (5 mg total) by mouth daily.  30 tablet  11  . PRECISION XTRA TEST STRIPS test strip as directed.      . simvastatin (ZOCOR) 20 MG tablet Take 10 mg by mouth at bedtime.        . TRADJENTA 5 MG TABS tablet TAKE 1 TABLET BY MOUTH DAILY FOR DIABETES  90 tablet  2   No current facility-administered medications for this visit.     Past Medical History  Diagnosis Date  . AICD (automatic cardioverter/defibrillator) present   . Obesity   . Prostate cancer   . Glucose intolerance (impaired glucose tolerance)   . Bursitis of elbow     left  . Osteoarthritis   . HTN  (hypertension)   . HLD (hyperlipidemia)   . History of DVT (deep vein thrombosis)   . Chronic systolic heart failure     echo 4/13: EF 25-30%, mod MR, mod LAE, PASP 47  . NICM (nonischemic cardiomyopathy)     normal cors by cath in 1990s;  nuclear 8/08: no ischemia; inf/lat/dist ant/apical scar  . Moderate mitral regurgitation     echo 4/13    Past Surgical History  Procedure Laterality Date  . Transurethral resection of prostate    . Cardiac defibrillator placement  10/09  . Hernia repair    . Robot assisted laparoscopic radical prostatectomy  6/08    History   Social History  . Marital Status: Married    Spouse Name: N/A    Number of Children: N/A  . Years of Education: N/A   Occupational History  . Paramedic    Social History Main Topics  . Smoking status: Former Smoker -- 0.50 packs/day for 10 years    Types: Cigarettes    Quit date: 11/01/1975  . Smokeless tobacco: Never Used     Comment: quit over 30 years ago   . Alcohol Use:  No     Comment: occasionally  . Drug Use: No  . Sexual Activity: Not Currently   Other Topics Concern  . Not on file   Social History Narrative  . No narrative on file    ROS: no fevers or chills, productive cough, hemoptysis, dysphasia, odynophagia, melena, hematochezia, dysuria, hematuria, rash, seizure activity, orthopnea, PND, pedal edema, claudication. Remaining systems are negative.  Physical Exam: Well-developed well-nourished in no acute distress.  Skin is warm and dry.  HEENT is normal.  Neck is supple.  Chest is clear to auscultation with normal expansion.  Cardiovascular exam is regular rate and rhythm. 2/6 systolic murmur apex. Abdominal exam nontender or distended. No masses palpated. Extremities show no edema. neuro grossly intact  ECG sinus rhythm at a rate of 87. Occasional PVC. Nonspecific ST change.

## 2013-03-31 NOTE — Assessment & Plan Note (Signed)
Management per electrophysiology. 

## 2013-03-31 NOTE — Assessment & Plan Note (Signed)
Continue ACE inhibitor and beta blocker. Repeat echo when he returns in one year.

## 2013-03-31 NOTE — Assessment & Plan Note (Signed)
Continue present blood pressure medications. 

## 2013-03-31 NOTE — Patient Instructions (Signed)
Your physician wants you to follow-up in: ONE YEAR WITH DR CRENSHAW You will receive a reminder letter in the mail two months in advance. If you don't receive a letter, please call our office to schedule the follow-up appointment.  

## 2013-04-09 ENCOUNTER — Encounter: Payer: Self-pay | Admitting: *Deleted

## 2013-04-24 ENCOUNTER — Other Ambulatory Visit: Payer: Self-pay

## 2013-04-25 ENCOUNTER — Encounter: Payer: Self-pay | Admitting: Internal Medicine

## 2013-05-16 ENCOUNTER — Other Ambulatory Visit: Payer: Self-pay | Admitting: Internal Medicine

## 2013-05-20 ENCOUNTER — Other Ambulatory Visit (INDEPENDENT_AMBULATORY_CARE_PROVIDER_SITE_OTHER): Payer: Medicare Other

## 2013-05-20 ENCOUNTER — Encounter: Payer: Self-pay | Admitting: Internal Medicine

## 2013-05-20 ENCOUNTER — Ambulatory Visit (INDEPENDENT_AMBULATORY_CARE_PROVIDER_SITE_OTHER): Payer: Medicare Other | Admitting: Internal Medicine

## 2013-05-20 VITALS — BP 108/70 | HR 74 | Temp 98.6°F | Resp 16 | Ht 69.0 in | Wt 206.0 lb

## 2013-05-20 DIAGNOSIS — E785 Hyperlipidemia, unspecified: Secondary | ICD-10-CM

## 2013-05-20 DIAGNOSIS — I10 Essential (primary) hypertension: Secondary | ICD-10-CM

## 2013-05-20 DIAGNOSIS — E1129 Type 2 diabetes mellitus with other diabetic kidney complication: Secondary | ICD-10-CM

## 2013-05-20 DIAGNOSIS — E1165 Type 2 diabetes mellitus with hyperglycemia: Secondary | ICD-10-CM

## 2013-05-20 LAB — MICROALBUMIN / CREATININE URINE RATIO
Creatinine,U: 156.5 mg/dL
Microalb Creat Ratio: 0.3 mg/g (ref 0.0–30.0)

## 2013-05-20 LAB — BASIC METABOLIC PANEL
BUN: 35 mg/dL — ABNORMAL HIGH (ref 6–23)
Chloride: 108 mEq/L (ref 96–112)
Creatinine, Ser: 1.3 mg/dL (ref 0.4–1.5)
GFR: 68.63 mL/min (ref >60.00)
Glucose, Bld: 110 mg/dL — ABNORMAL HIGH (ref 70–99)
Potassium: 4 mEq/L (ref 3.5–5.1)

## 2013-05-20 LAB — HEMOGLOBIN A1C: Hgb A1c MFr Bld: 6.5 % (ref 4.6–6.5)

## 2013-05-20 NOTE — Assessment & Plan Note (Signed)
His A1C shows good control of the blood sugars His renal function is stable

## 2013-05-20 NOTE — Assessment & Plan Note (Signed)
His BP is well controlled 

## 2013-05-20 NOTE — Patient Instructions (Signed)
Type 2 Diabetes Mellitus, Adult Type 2 diabetes mellitus, often simply referred to as type 2 diabetes, is a long-lasting (chronic) disease. In type 2 diabetes, the pancreas does not make enough insulin (a hormone), the cells are less responsive to the insulin that is made (insulin resistance), or both. Normally, insulin moves sugars from food into the tissue cells. The tissue cells use the sugars for energy. The lack of insulin or the lack of normal response to insulin causes excess sugars to build up in the blood instead of going into the tissue cells. As a result, high blood sugar (hyperglycemia) develops. The effect of high sugar (glucose) levels can cause many complications. Type 2 diabetes was also previously called adult-onset diabetes but it can occur at any age.  RISK FACTORS  A person is predisposed to developing type 2 diabetes if someone in the family has the disease and also has one or more of the following primary risk factors:  Overweight.  An inactive lifestyle.  A history of consistently eating high-calorie foods. Maintaining a normal weight and regular physical activity can reduce the chance of developing type 2 diabetes. SYMPTOMS  A person with type 2 diabetes may not show symptoms initially. The symptoms of type 2 diabetes appear slowly. The symptoms include:  Increased thirst (polydipsia).  Increased urination (polyuria).  Increased urination during the night (nocturia).  Weight loss. This weight loss may be rapid.  Frequent, recurring infections.  Tiredness (fatigue).  Weakness.  Vision changes, such as blurred vision.  Fruity smell to your breath.  Abdominal pain.  Nausea or vomiting.  Cuts or bruises which are slow to heal.  Tingling or numbness in the hands or feet. DIAGNOSIS Type 2 diabetes is frequently not diagnosed until complications of diabetes are present. Type 2 diabetes is diagnosed when symptoms or complications are present and when blood  glucose levels are increased. Your blood glucose level may be checked by one or more of the following blood tests:  A fasting blood glucose test. You will not be allowed to eat for at least 8 hours before a blood sample is taken.  A random blood glucose test. Your blood glucose is checked at any time of the day regardless of when you ate.  A hemoglobin A1c blood glucose test. A hemoglobin A1c test provides information about blood glucose control over the previous 3 months.  An oral glucose tolerance test (OGTT). Your blood glucose is measured after you have not eaten (fasted) for 2 hours and then after you drink a glucose-containing beverage. TREATMENT   You may need to take insulin or diabetes medicine daily to keep blood glucose levels in the desired range.  You will need to match insulin dosing with exercise and healthy food choices. The treatment goal is to maintain the before meal blood sugar (preprandial glucose) level at 70 130 mg/dL. HOME CARE INSTRUCTIONS   Have your hemoglobin A1c level checked twice a year.  Perform daily blood glucose monitoring as directed by your caregiver.  Monitor urine ketones when you are ill and as directed by your caregiver.  Take your diabetes medicine or insulin as directed by your caregiver to maintain your blood glucose levels in the desired range.  Never run out of diabetes medicine or insulin. It is needed every day.  Adjust insulin based on your intake of carbohydrates. Carbohydrates can raise blood glucose levels but need to be included in your diet. Carbohydrates provide vitamins, minerals, and fiber which are an essential part of   a healthy diet. Carbohydrates are found in fruits, vegetables, whole grains, dairy products, legumes, and foods containing added sugars.    Eat healthy foods. Alternate 3 meals with 3 snacks.  Lose weight if overweight.  Carry a medical alert card or wear your medical alert jewelry.  Carry a 15 gram  carbohydrate snack with you at all times to treat low blood glucose (hypoglycemia). Some examples of 15 gram carbohydrate snacks include:  Glucose tablets, 3 or 4   Glucose gel, 15 gram tube  Raisins, 2 tablespoons (24 grams)  Jelly beans, 6  Animal crackers, 8  Regular pop, 4 ounces (120 mL)  Gummy treats, 9  Recognize hypoglycemia. Hypoglycemia occurs with blood glucose levels of 70 mg/dL and below. The risk for hypoglycemia increases when fasting or skipping meals, during or after intense exercise, and during sleep. Hypoglycemia symptoms can include:  Tremors or shakes.  Decreased ability to concentrate.  Sweating.  Increased heart rate.  Headache.  Dry mouth.  Hunger.  Irritability.  Anxiety.  Restless sleep.  Altered speech or coordination.  Confusion.  Treat hypoglycemia promptly. If you are alert and able to safely swallow, follow the 15:15 rule:  Take 15 20 grams of rapid-acting glucose or carbohydrate. Rapid-acting options include glucose gel, glucose tablets, or 4 ounces (120 mL) of fruit juice, regular soda, or low fat milk.  Check your blood glucose level 15 minutes after taking the glucose.  Take 15 20 grams more of glucose if the repeat blood glucose level is still 70 mg/dL or below.  Eat a meal or snack within 1 hour once blood glucose levels return to normal.    Be alert to polyuria and polydipsia which are early signs of hyperglycemia. An early awareness of hyperglycemia allows for prompt treatment. Treat hyperglycemia as directed by your caregiver.  Engage in at least 150 minutes of moderate-intensity physical activity a week, spread over at least 3 days of the week or as directed by your caregiver. In addition, you should engage in resistance exercise at least 2 times a week or as directed by your caregiver.  Adjust your medicine and food intake as needed if you start a new exercise or sport.  Follow your sick day plan at any time you  are unable to eat or drink as usual.  Avoid tobacco use.  Limit alcohol intake to no more than 1 drink per day for nonpregnant women and 2 drinks per day for men. You should drink alcohol only when you are also eating food. Talk with your caregiver whether alcohol is safe for you. Tell your caregiver if you drink alcohol several times a week.  Follow up with your caregiver regularly.  Schedule an eye exam soon after the diagnosis of type 2 diabetes and then annually.  Perform daily skin and foot care. Examine your skin and feet daily for cuts, bruises, redness, nail problems, bleeding, blisters, or sores. A foot exam by a caregiver should be done annually.  Brush your teeth and gums at least twice a day and floss at least once a day. Follow up with your dentist regularly.  Share your diabetes management plan with your workplace or school.  Stay up-to-date with immunizations.  Learn to manage stress.  Obtain ongoing diabetes education and support as needed.  Participate in, or seek rehabilitation as needed to maintain or improve independence and quality of life. Request a physical or occupational therapy referral if you are having foot or hand numbness or difficulties with grooming,   dressing, eating, or physical activity. SEEK MEDICAL CARE IF:   You are unable to eat food or drink fluids for more than 6 hours.  You have nausea and vomiting for more than 6 hours.  Your blood glucose level is over 240 mg/dL.  There is a change in mental status.  You develop an additional serious illness.  You have diarrhea for more than 6 hours.  You have been sick or have had a fever for a couple of days and are not getting better.  You have pain during any physical activity.  SEEK IMMEDIATE MEDICAL CARE IF:  You have difficulty breathing.  You have moderate to large ketone levels. MAKE SURE YOU:  Understand these instructions.  Will watch your condition.  Will get help right away if  you are not doing well or get worse. Document Released: 06/05/2005 Document Revised: 02/28/2012 Document Reviewed: 01/02/2012 [employer] Patient Information 2014 [employer].  

## 2013-05-20 NOTE — Progress Notes (Signed)
Subjective:    Patient ID: [name], male    DOB: August 26, 1940, 72 y.o.   MRN: [id number]  Diabetes He presents for his follow-up diabetic visit. He has type 2 diabetes mellitus. His disease course has been stable. There are no hypoglycemic associated symptoms. Pertinent negatives for hypoglycemia include no dizziness or tremors. Pertinent negatives for diabetes include no blurred vision, no chest pain, no fatigue, no foot paresthesias, no foot ulcerations, no polydipsia, no polyphagia, no polyuria, no visual change, no weakness and no weight loss. There are no hypoglycemic complications. Diabetic complications include heart disease. Pertinent negatives for diabetic complications include no CVA. Current diabetic treatment includes oral agent (monotherapy). He is compliant with treatment all of the time. His weight is stable. He is following a generally healthy diet. Meal planning includes avoidance of concentrated sweets. He has not had a previous visit with a dietician. He participates in exercise intermittently. There is no change in his home blood glucose trend. An ACE inhibitor/angiotensin II receptor blocker is contraindicated (his BP is too low for an ACEI). He does not see a podiatrist.Eye exam is current.      Review of Systems  Constitutional: Negative.  Negative for fever, chills, weight loss, diaphoresis, appetite change and fatigue.  HENT: Negative.   Eyes: Negative.  Negative for blurred vision.  Respiratory: Negative.  Negative for cough, chest tightness, shortness of breath, wheezing and stridor.   Cardiovascular: Negative.  Negative for chest pain, palpitations and leg swelling.  Gastrointestinal: Negative.  Negative for nausea, vomiting, abdominal pain, diarrhea, constipation and blood in stool.  Endocrine: Negative.  Negative for polydipsia, polyphagia and polyuria.  Genitourinary: Negative.   Musculoskeletal: Negative.  Negative for arthralgias, back pain, myalgias, neck pain  and neck stiffness.  Skin: Negative.   Allergic/Immunologic: Negative.   Neurological: Negative.  Negative for dizziness, tremors, facial asymmetry, weakness and light-headedness.  Hematological: Negative.  Negative for adenopathy. Does not bruise/bleed easily.  Psychiatric/Behavioral: Negative.        Objective:   Physical Exam  Vitals reviewed. Constitutional: He is oriented to person, place, and time. He appears well-developed and well-nourished. No distress.  HENT:  Head: Normocephalic and atraumatic.  Mouth/Throat: Oropharynx is clear and moist. No oropharyngeal exudate.  Eyes: Conjunctivae are normal. Right eye exhibits no discharge. Left eye exhibits no discharge. No scleral icterus.  Neck: Normal range of motion. Neck supple. No JVD present. No tracheal deviation present. No thyromegaly present.  Cardiovascular: Normal rate, regular rhythm, normal heart sounds and intact distal pulses.  Exam reveals no gallop and no friction rub.   No murmur heard. Pulmonary/Chest: Effort normal and breath sounds normal. No stridor. No respiratory distress. He has no wheezes. He has no rales. He exhibits no tenderness.  Abdominal: Soft. Bowel sounds are normal. He exhibits no distension and no mass. There is no tenderness. There is no rebound and no guarding.  Musculoskeletal: Normal range of motion. He exhibits no edema and no tenderness.  Lymphadenopathy:    He has no cervical adenopathy.  Neurological: He is oriented to person, place, and time.  Skin: Skin is warm and dry. No rash noted. He is not diaphoretic. No erythema. No pallor.  Psychiatric: He has a normal mood and affect. His behavior is normal. Judgment and thought content normal.     Lab Results  Component Value Date   WBC 5.1 09/11/2012   HGB 12.8* 09/11/2012   HCT 39.0 09/11/2012   PLT 140.0* 09/11/2012   GLUCOSE  110* 01/14/2013   CHOL 141 01/14/2013   TRIG 106.0 01/14/2013   HDL 44.90 01/14/2013   LDLCALC 75 01/14/2013   ALT  35 01/14/2013   AST 26 01/14/2013   NA 140 01/14/2013   K 4.4 01/14/2013   CL 105 01/14/2013   CREATININE 1.2 01/14/2013   BUN 18 01/14/2013   CO2 30 01/14/2013   TSH 1.46 03/12/2012   PSA <0.01 ng/mL* 11/09/2008   INR 4.4 RATIO* 06/25/2008   HGBA1C 6.3 01/14/2013   MICROALBUR 14.1* 08/10/2011       Assessment & Plan:

## 2013-05-20 NOTE — Progress Notes (Signed)
Pre visit review using our clinic review tool, if applicable. No additional management support is needed unless otherwise documented below in the visit note. 

## 2013-05-20 NOTE — Assessment & Plan Note (Signed)
Goal achieved 

## 2013-06-26 ENCOUNTER — Ambulatory Visit (INDEPENDENT_AMBULATORY_CARE_PROVIDER_SITE_OTHER): Payer: Medicare Other | Admitting: *Deleted

## 2013-06-26 DIAGNOSIS — I428 Other cardiomyopathies: Secondary | ICD-10-CM

## 2013-06-26 DIAGNOSIS — I509 Heart failure, unspecified: Secondary | ICD-10-CM

## 2013-06-26 DIAGNOSIS — I420 Dilated cardiomyopathy: Secondary | ICD-10-CM

## 2013-06-26 DIAGNOSIS — I4729 Other ventricular tachycardia: Secondary | ICD-10-CM

## 2013-06-26 DIAGNOSIS — I5022 Chronic systolic (congestive) heart failure: Secondary | ICD-10-CM

## 2013-06-26 DIAGNOSIS — I472 Ventricular tachycardia, unspecified: Secondary | ICD-10-CM

## 2013-06-29 LAB — MDC_IDC_ENUM_SESS_TYPE_REMOTE
Battery Remaining Longevity: 43 mo
Brady Statistic RV Percent Paced: 1 %
Date Time Interrogation Session: 20150108084130
HIGH POWER IMPEDANCE MEASURED VALUE: 47 Ohm
Implantable Pulse Generator Serial Number: 665247
Lead Channel Impedance Value: 360 Ohm
Lead Channel Sensing Intrinsic Amplitude: 12 mV
Lead Channel Setting Pacing Amplitude: 2.5 V
Lead Channel Setting Pacing Pulse Width: 0.5 ms
MDC IDC MSMT BATTERY VOLTAGE: 2.65 V
MDC IDC SET LEADCHNL RV SENSING SENSITIVITY: 0.3 mV
Zone Setting Detection Interval: 300 ms

## 2013-07-14 ENCOUNTER — Encounter: Payer: Self-pay | Admitting: *Deleted

## 2013-07-15 ENCOUNTER — Encounter: Payer: Self-pay | Admitting: Internal Medicine

## 2013-07-18 ENCOUNTER — Other Ambulatory Visit: Payer: Self-pay | Admitting: Internal Medicine

## 2013-08-06 LAB — HM DIABETES EYE EXAM

## 2013-08-15 ENCOUNTER — Other Ambulatory Visit: Payer: Self-pay | Admitting: Internal Medicine

## 2013-09-12 ENCOUNTER — Other Ambulatory Visit: Payer: Self-pay | Admitting: Internal Medicine

## 2013-09-15 ENCOUNTER — Encounter: Payer: Self-pay | Admitting: Internal Medicine

## 2013-09-15 ENCOUNTER — Ambulatory Visit (INDEPENDENT_AMBULATORY_CARE_PROVIDER_SITE_OTHER): Payer: Medicare Other | Admitting: Internal Medicine

## 2013-09-15 VITALS — BP 125/71 | HR 73 | Ht 69.0 in | Wt 208.0 lb

## 2013-09-15 DIAGNOSIS — I509 Heart failure, unspecified: Secondary | ICD-10-CM

## 2013-09-15 DIAGNOSIS — I5022 Chronic systolic (congestive) heart failure: Secondary | ICD-10-CM

## 2013-09-15 DIAGNOSIS — I472 Ventricular tachycardia: Secondary | ICD-10-CM

## 2013-09-15 DIAGNOSIS — I4729 Other ventricular tachycardia: Secondary | ICD-10-CM

## 2013-09-15 LAB — MDC_IDC_ENUM_SESS_TYPE_INCLINIC
Battery Remaining Longevity: 42 mo
Battery Voltage: 2.62 V
Brady Statistic RV Percent Paced: 0 %
HighPow Impedance: 51.0242
Implantable Pulse Generator Serial Number: 665247
Lead Channel Pacing Threshold Pulse Width: 0.5 ms
Lead Channel Sensing Intrinsic Amplitude: 12 mV
Lead Channel Setting Pacing Amplitude: 2.5 V
Lead Channel Setting Pacing Pulse Width: 0.5 ms
Lead Channel Setting Sensing Sensitivity: 0.3 mV
MDC IDC MSMT LEADCHNL RV IMPEDANCE VALUE: 375 Ohm
MDC IDC MSMT LEADCHNL RV PACING THRESHOLD AMPLITUDE: 1.25 V
MDC IDC SESS DTM: 20150330091620
Zone Setting Detection Interval: 300 ms

## 2013-09-15 NOTE — Progress Notes (Signed)
PCP:  Scarlette Calico, MD Primary Cardiologist:  Dr Stanford Breed  The patient presents today for routine electrophysiology followup.  Since last being seen in our clinic, the patient reports doing reasonably well.  His exercise tolerance is stable.  He walks on the treadmill for 10 minutes every other day.  Today, he denies symptoms of palpitations, chest pain,  orthopnea, PND, lower extremity edema, dizziness, presyncope, syncope, or neurologic sequela.  The patient feels that he is tolerating medications without difficulties and is otherwise without complaint today.   Past Medical History  Diagnosis Date  . AICD (automatic cardioverter/defibrillator) present   . Obesity   . Prostate cancer   . Glucose intolerance (impaired glucose tolerance)   . Bursitis of elbow     left  . Osteoarthritis   . HTN (hypertension)   . HLD (hyperlipidemia)   . History of DVT (deep vein thrombosis)   . Chronic systolic heart failure     echo 4/13: EF 25-30%, mod MR, mod LAE, PASP 47  . NICM (nonischemic cardiomyopathy)     normal cors by cath in 1990s;  nuclear 8/08: no ischemia; inf/lat/dist ant/apical scar  . Moderate mitral regurgitation     echo 4/13   Past Surgical History  Procedure Laterality Date  . Transurethral resection of prostate    . Cardiac defibrillator placement  10/09  . Hernia repair    . Robot assisted laparoscopic radical prostatectomy  6/08    Current Outpatient Prescriptions  Medication Sig Dispense Refill  . aspirin 81 MG tablet Take 81 mg by mouth daily.        . carvedilol (COREG) 25 MG tablet Take 25 mg by mouth 2 (two) times daily with a meal.        . digoxin (LANOXIN) 0.125 MG tablet Take 125 mcg by mouth daily.        . furosemide (LASIX) 40 MG tablet Take 40 mg by mouth daily. Take daily as directed      . lisinopril (PRINIVIL,ZESTRIL) 5 MG tablet Take 1 tablet (5 mg total) by mouth daily.  30 tablet  11  . methotrexate (RHEUMATREX) 2.5 MG tablet Take 6 tablets every  Wednesday      . simvastatin (ZOCOR) 20 MG tablet Take 10 mg by mouth at bedtime.        . TRADJENTA 5 MG TABS tablet TAKE 1 TABLET BY MOUTH DAILY FOR DIABETES  90 tablet  2  . ULORIC 80 MG TABS TAKE 1 TABLET (80 MG TOTAL) BY MOUTH DAILY.  90 tablet  2   No current facility-administered medications for this visit.    Allergies  Allergen Reactions  . Spironolactone     History   Social History  . Marital Status: Married    Spouse Name: N/A    Number of Children: N/A  . Years of Education: N/A   Occupational History  . Tour manager    Social History Main Topics  . Smoking status: Former Smoker -- 0.50 packs/day for 10 years    Types: Cigarettes    Quit date: 11/01/1975  . Smokeless tobacco: Never Used     Comment: quit over 30 years ago   . Alcohol Use: No     Comment: occasionally  . Drug Use: No  . Sexual Activity: Not Currently   Other Topics Concern  . Not on file   Social History Narrative  . No narrative on file    Family History  Problem Relation Age of Onset  .  Cancer Father     prostate  . Heart disease Mother     Physical Exam: Filed Vitals:   09/15/13 0912  BP: 125/71  Pulse: 73  Height: 5\' 9"  (1.753 m)  Weight: 208 lb (94.348 kg)    GEN- The patient is well appearing, alert and oriented x 3 today.   Head- normocephalic, atraumatic Eyes-  Sclera clear, conjunctiva pink Ears- hearing intact Oropharynx- clear Neck- supple, JVP 6cm Lungs- clear Chest- ICD pocket is well healed Heart- Regular rate and rhythm, 2/6 SEM LSB GI- soft, NT, ND, + BS Extremities- no clubbing, cyanosis, or edema  ICD interrogation- reviewed in detail today,  See PACEART report  Assessment and Plan:  1. Chronic systolic dysfunction Normal ICD function See Pace Art report No changes today  Merlin Return to see Jerene Pitch in 1 year Follow-up with Dr Stanford Breed as scheduled

## 2013-09-15 NOTE — Patient Instructions (Signed)
Your physician wants you to follow-up in: 12 months with Gary Hutchinson, PA You will receive a reminder letter in the mail two months in advance. If you don't receive a letter, please call our office to schedule the follow-up appointment.    Remote monitoring is used to monitor your Pacemaker or ICD from home. This monitoring reduces the number of office visits required to check your device to one time per year. It allows [place] to keep an eye on the functioning of your device to ensure it is working properly. You are scheduled for a device check from home on 12/17/13. You may send your transmission at any time that day. If you have a wireless device, the transmission will be sent automatically. After your physician reviews your transmission, you will receive a postcard with your next transmission date.

## 2013-09-18 ENCOUNTER — Encounter: Payer: Self-pay | Admitting: Internal Medicine

## 2013-09-18 ENCOUNTER — Other Ambulatory Visit (INDEPENDENT_AMBULATORY_CARE_PROVIDER_SITE_OTHER): Payer: Medicare Other

## 2013-09-18 ENCOUNTER — Ambulatory Visit (INDEPENDENT_AMBULATORY_CARE_PROVIDER_SITE_OTHER): Payer: Medicare Other | Admitting: Internal Medicine

## 2013-09-18 VITALS — BP 120/70 | HR 84 | Temp 97.6°F | Resp 16 | Ht 69.0 in | Wt 206.0 lb

## 2013-09-18 DIAGNOSIS — E1165 Type 2 diabetes mellitus with hyperglycemia: Principal | ICD-10-CM

## 2013-09-18 DIAGNOSIS — E1129 Type 2 diabetes mellitus with other diabetic kidney complication: Secondary | ICD-10-CM

## 2013-09-18 DIAGNOSIS — Z23 Encounter for immunization: Secondary | ICD-10-CM

## 2013-09-18 DIAGNOSIS — I10 Essential (primary) hypertension: Secondary | ICD-10-CM

## 2013-09-18 LAB — BASIC METABOLIC PANEL
BUN: 28 mg/dL — ABNORMAL HIGH (ref 6–23)
CO2: 27 meq/L (ref 19–32)
Calcium: 9.1 mg/dL (ref 8.4–10.5)
Chloride: 105 mEq/L (ref 96–112)
Creatinine, Ser: 1.4 mg/dL (ref 0.4–1.5)
GFR: 62.52 mL/min (ref >60.00)
GLUCOSE: 118 mg/dL — AB (ref 70–99)
Potassium: 4.1 mEq/L (ref 3.5–5.1)
SODIUM: 140 meq/L (ref 135–145)

## 2013-09-18 LAB — HEMOGLOBIN A1C: Hgb A1c MFr Bld: 6.5 % (ref 4.6–6.5)

## 2013-09-18 NOTE — Assessment & Plan Note (Signed)
His blood sugars are well controlled 

## 2013-09-18 NOTE — Patient Instructions (Signed)
Type 2 Diabetes Mellitus, Adult Type 2 diabetes mellitus, often simply referred to as type 2 diabetes, is a long-lasting (chronic) disease. In type 2 diabetes, the pancreas does not make enough insulin (a hormone), the cells are less responsive to the insulin that is made (insulin resistance), or both. Normally, insulin moves sugars from food into the tissue cells. The tissue cells use the sugars for energy. The lack of insulin or the lack of normal response to insulin causes excess sugars to build up in the blood instead of going into the tissue cells. As a result, high blood sugar (hyperglycemia) develops. The effect of high sugar (glucose) levels can cause many complications. Type 2 diabetes was also previously called adult-onset diabetes but it can occur at any age.  RISK FACTORS  A person is predisposed to developing type 2 diabetes if someone in the family has the disease and also has one or more of the following primary risk factors:  Overweight.  An inactive lifestyle.  A history of consistently eating high-calorie foods. Maintaining a normal weight and regular physical activity can reduce the chance of developing type 2 diabetes. SYMPTOMS  A person with type 2 diabetes may not show symptoms initially. The symptoms of type 2 diabetes appear slowly. The symptoms include:  Increased thirst (polydipsia).  Increased urination (polyuria).  Increased urination during the night (nocturia).  Weight loss. This weight loss may be rapid.  Frequent, recurring infections.  Tiredness (fatigue).  Weakness.  Vision changes, such as blurred vision.  Fruity smell to your breath.  Abdominal pain.  Nausea or vomiting.  Cuts or bruises which are slow to heal.  Tingling or numbness in the hands or feet. DIAGNOSIS Type 2 diabetes is frequently not diagnosed until complications of diabetes are present. Type 2 diabetes is diagnosed when symptoms or complications are present and when blood  glucose levels are increased. Your blood glucose level may be checked by one or more of the following blood tests:  A fasting blood glucose test. You will not be allowed to eat for at least 8 hours before a blood sample is taken.  A random blood glucose test. Your blood glucose is checked at any time of the day regardless of when you ate.  A hemoglobin A1c blood glucose test. A hemoglobin A1c test provides information about blood glucose control over the previous 3 months.  An oral glucose tolerance test (OGTT). Your blood glucose is measured after you have not eaten (fasted) for 2 hours and then after you drink a glucose-containing beverage. TREATMENT   You may need to take insulin or diabetes medicine daily to keep blood glucose levels in the desired range.  You will need to match insulin dosing with exercise and healthy food choices. The treatment goal is to maintain the before meal blood sugar (preprandial glucose) level at 70 130 mg/dL. HOME CARE INSTRUCTIONS   Have your hemoglobin A1c level checked twice a year.  Perform daily blood glucose monitoring as directed by your caregiver.  Monitor urine ketones when you are ill and as directed by your caregiver.  Take your diabetes medicine or insulin as directed by your caregiver to maintain your blood glucose levels in the desired range.  Never run out of diabetes medicine or insulin. It is needed every day.  Adjust insulin based on your intake of carbohydrates. Carbohydrates can raise blood glucose levels but need to be included in your diet. Carbohydrates provide vitamins, minerals, and fiber which are an essential part of   a healthy diet. Carbohydrates are found in fruits, vegetables, whole grains, dairy products, legumes, and foods containing added sugars.    Eat healthy foods. Alternate 3 meals with 3 snacks.  Lose weight if overweight.  Carry a medical alert card or wear your medical alert jewelry.  Carry a 15 gram  carbohydrate snack with you at all times to treat low blood glucose (hypoglycemia). Some examples of 15 gram carbohydrate snacks include:  Glucose tablets, 3 or 4   Glucose gel, 15 gram tube  Raisins, 2 tablespoons (24 grams)  Jelly beans, 6  Animal crackers, 8  Regular pop, 4 ounces (120 mL)  Gummy treats, 9  Recognize hypoglycemia. Hypoglycemia occurs with blood glucose levels of 70 mg/dL and below. The risk for hypoglycemia increases when fasting or skipping meals, during or after intense exercise, and during sleep. Hypoglycemia symptoms can include:  Tremors or shakes.  Decreased ability to concentrate.  Sweating.  Increased heart rate.  Headache.  Dry mouth.  Hunger.  Irritability.  Anxiety.  Restless sleep.  Altered speech or coordination.  Confusion.  Treat hypoglycemia promptly. If you are alert and able to safely swallow, follow the 15:15 rule:  Take 15 20 grams of rapid-acting glucose or carbohydrate. Rapid-acting options include glucose gel, glucose tablets, or 4 ounces (120 mL) of fruit juice, regular soda, or low fat milk.  Check your blood glucose level 15 minutes after taking the glucose.  Take 15 20 grams more of glucose if the repeat blood glucose level is still 70 mg/dL or below.  Eat a meal or snack within 1 hour once blood glucose levels return to normal.    Be alert to polyuria and polydipsia which are early signs of hyperglycemia. An early awareness of hyperglycemia allows for prompt treatment. Treat hyperglycemia as directed by your caregiver.  Engage in at least 150 minutes of moderate-intensity physical activity a week, spread over at least 3 days of the week or as directed by your caregiver. In addition, you should engage in resistance exercise at least 2 times a week or as directed by your caregiver.  Adjust your medicine and food intake as needed if you start a new exercise or sport.  Follow your sick day plan at any time you  are unable to eat or drink as usual.  Avoid tobacco use.  Limit alcohol intake to no more than 1 drink per day for nonpregnant women and 2 drinks per day for men. You should drink alcohol only when you are also eating food. Talk with your caregiver whether alcohol is safe for you. Tell your caregiver if you drink alcohol several times a week.  Follow up with your caregiver regularly.  Schedule an eye exam soon after the diagnosis of type 2 diabetes and then annually.  Perform daily skin and foot care. Examine your skin and feet daily for cuts, bruises, redness, nail problems, bleeding, blisters, or sores. A foot exam by a caregiver should be done annually.  Brush your teeth and gums at least twice a day and floss at least once a day. Follow up with your dentist regularly.  Share your diabetes management plan with your workplace or school.  Stay up-to-date with immunizations.  Learn to manage stress.  Obtain ongoing diabetes education and support as needed.  Participate in, or seek rehabilitation as needed to maintain or improve independence and quality of life. Request a physical or occupational therapy referral if you are having foot or hand numbness or difficulties with grooming,   dressing, eating, or physical activity. SEEK MEDICAL CARE IF:   You are unable to eat food or drink fluids for more than 6 hours.  You have nausea and vomiting for more than 6 hours.  Your blood glucose level is over 240 mg/dL.  There is a change in mental status.  You develop an additional serious illness.  You have diarrhea for more than 6 hours.  You have been sick or have had a fever for a couple of days and are not getting better.  You have pain during any physical activity.  SEEK IMMEDIATE MEDICAL CARE IF:  You have difficulty breathing.  You have moderate to large ketone levels. MAKE SURE YOU:  Understand these instructions.  Will watch your condition.  Will get help right away if  you are not doing well or get worse. Document Released: 06/05/2005 Document Revised: 02/28/2012 Document Reviewed: 01/02/2012 [employer] Patient Information 2014 [employer].  

## 2013-09-18 NOTE — Progress Notes (Signed)
Pre visit review using our clinic review tool, if applicable. No additional management support is needed unless otherwise documented below in the visit note. 

## 2013-09-18 NOTE — Assessment & Plan Note (Signed)
His BP is well controlled His lytes and renal function are stable 

## 2013-09-18 NOTE — Progress Notes (Signed)
   Subjective:    Patient ID: [name], male    DOB: 02-27-1941, 73 y.o.   MRN: [id number]  Diabetes He presents for his follow-up diabetic visit. He has type 2 diabetes mellitus. His disease course has been stable. There are no hypoglycemic associated symptoms. Pertinent negatives for diabetes include no blurred vision, no chest pain, no fatigue, no foot paresthesias, no foot ulcerations, no polydipsia, no polyphagia, no polyuria, no visual change, no weakness and no weight loss. Diabetic complications include heart disease. Current diabetic treatment includes oral agent (monotherapy). His weight is stable. He is following a generally healthy diet. Meal planning includes avoidance of concentrated sweets. He has not had a previous visit with a dietician. He participates in exercise intermittently. There is no change in his home blood glucose trend. An ACE inhibitor/angiotensin II receptor blocker is not being taken. He does not see a podiatrist.Eye exam is current.      Review of Systems  Constitutional: Negative for weight loss and fatigue.  Eyes: Negative for blurred vision.  Cardiovascular: Negative for chest pain.  Endocrine: Negative for polydipsia, polyphagia and polyuria.  Neurological: Negative for weakness.  All other systems reviewed and are negative.       Objective:   Physical Exam  Vitals reviewed. Constitutional: He is oriented to person, place, and time. He appears well-developed and well-nourished. No distress.  HENT:  Head: Normocephalic and atraumatic.  Mouth/Throat: Oropharynx is clear and moist. No oropharyngeal exudate.  Eyes: Conjunctivae are normal. Right eye exhibits no discharge. Left eye exhibits no discharge. No scleral icterus.  Neck: Normal range of motion. Neck supple. No JVD present. No tracheal deviation present. No thyromegaly present.  Cardiovascular: Normal rate, regular rhythm, normal heart sounds and intact distal pulses.  Exam reveals no gallop  and no friction rub.   No murmur heard. Pulmonary/Chest: Effort normal and breath sounds normal. No stridor. No respiratory distress. He has no wheezes. He has no rales. He exhibits no tenderness.  Abdominal: Soft. Bowel sounds are normal. He exhibits no distension and no mass. There is no tenderness. There is no rebound and no guarding.  Musculoskeletal: Normal range of motion. He exhibits no edema and no tenderness.  Lymphadenopathy:    He has no cervical adenopathy.  Neurological: He is oriented to person, place, and time.  Skin: Skin is warm and dry. No rash noted. He is not diaphoretic. No erythema. No pallor.  Psychiatric: He has a normal mood and affect. His behavior is normal. Judgment and thought content normal.     Lab Results  Component Value Date   WBC 5.1 09/11/2012   HGB 12.8* 09/11/2012   HCT 39.0 09/11/2012   PLT 140.0* 09/11/2012   GLUCOSE 110* 05/20/2013   CHOL 141 01/14/2013   TRIG 106.0 01/14/2013   HDL 44.90 01/14/2013   LDLCALC 75 01/14/2013   ALT 35 01/14/2013   AST 26 01/14/2013   NA 140 05/20/2013   K 4.0 05/20/2013   CL 108 05/20/2013   CREATININE 1.3 05/20/2013   BUN 35* 05/20/2013   CO2 23 05/20/2013   TSH 1.46 03/12/2012   PSA <0.01 ng/mL* 11/09/2008   INR 4.4 RATIO* 06/25/2008   HGBA1C 6.5 05/20/2013   MICROALBUR 0.4 05/20/2013       Assessment & Plan:

## 2013-09-22 ENCOUNTER — Telehealth: Payer: Self-pay | Admitting: Internal Medicine

## 2013-09-22 NOTE — Telephone Encounter (Signed)
Relevant patient education assigned to patient using Emmi. ° °

## 2013-09-23 ENCOUNTER — Encounter: Payer: Self-pay | Admitting: Internal Medicine

## 2013-12-17 ENCOUNTER — Ambulatory Visit (INDEPENDENT_AMBULATORY_CARE_PROVIDER_SITE_OTHER): Payer: Medicare Other | Admitting: *Deleted

## 2013-12-17 DIAGNOSIS — I420 Dilated cardiomyopathy: Secondary | ICD-10-CM

## 2013-12-17 DIAGNOSIS — I428 Other cardiomyopathies: Secondary | ICD-10-CM

## 2013-12-17 LAB — MDC_IDC_ENUM_SESS_TYPE_REMOTE
Battery Voltage: 2.6 V
Brady Statistic RV Percent Paced: 1 %
HighPow Impedance: 53 Ohm
Implantable Pulse Generator Serial Number: 665247
Lead Channel Impedance Value: 360 Ohm
Lead Channel Pacing Threshold Amplitude: 1.25 V
Lead Channel Pacing Threshold Pulse Width: 0.5 ms
Lead Channel Sensing Intrinsic Amplitude: 12 mV
Lead Channel Setting Pacing Pulse Width: 0.5 ms
Lead Channel Setting Sensing Sensitivity: 0.3 mV
MDC IDC MSMT BATTERY REMAINING LONGEVITY: 41 mo
MDC IDC SESS DTM: 20150701073938
MDC IDC SET LEADCHNL RV PACING AMPLITUDE: 2.5 V
MDC IDC SET ZONE DETECTION INTERVAL: 300 ms

## 2013-12-17 NOTE — Progress Notes (Signed)
Remote ICD transmission.   

## 2014-01-06 ENCOUNTER — Encounter: Payer: Self-pay | Admitting: Cardiology

## 2014-01-19 ENCOUNTER — Ambulatory Visit: Payer: Medicare Other | Admitting: Internal Medicine

## 2014-01-19 DIAGNOSIS — Z0289 Encounter for other administrative examinations: Secondary | ICD-10-CM

## 2014-01-21 ENCOUNTER — Telehealth: Payer: Self-pay | Admitting: Family Medicine

## 2014-01-21 NOTE — Telephone Encounter (Signed)
noted 

## 2014-01-21 NOTE — Telephone Encounter (Signed)
Patient no showed for 4 month fu on 08/3.  Please advise.

## 2014-01-22 ENCOUNTER — Encounter: Payer: Self-pay | Admitting: Internal Medicine

## 2014-02-05 ENCOUNTER — Ambulatory Visit: Payer: Medicare Other | Admitting: Internal Medicine

## 2014-03-02 ENCOUNTER — Other Ambulatory Visit (INDEPENDENT_AMBULATORY_CARE_PROVIDER_SITE_OTHER): Payer: Medicare Other

## 2014-03-02 ENCOUNTER — Encounter: Payer: Self-pay | Admitting: Internal Medicine

## 2014-03-02 ENCOUNTER — Ambulatory Visit (INDEPENDENT_AMBULATORY_CARE_PROVIDER_SITE_OTHER): Payer: Medicare Other | Admitting: Internal Medicine

## 2014-03-02 VITALS — BP 118/78 | HR 82 | Temp 98.0°F | Resp 16 | Wt 209.0 lb

## 2014-03-02 DIAGNOSIS — I4729 Other ventricular tachycardia: Secondary | ICD-10-CM

## 2014-03-02 DIAGNOSIS — C61 Malignant neoplasm of prostate: Secondary | ICD-10-CM

## 2014-03-02 DIAGNOSIS — M1990 Unspecified osteoarthritis, unspecified site: Secondary | ICD-10-CM

## 2014-03-02 DIAGNOSIS — E1129 Type 2 diabetes mellitus with other diabetic kidney complication: Secondary | ICD-10-CM

## 2014-03-02 DIAGNOSIS — I420 Dilated cardiomyopathy: Secondary | ICD-10-CM

## 2014-03-02 DIAGNOSIS — E785 Hyperlipidemia, unspecified: Secondary | ICD-10-CM

## 2014-03-02 DIAGNOSIS — Z95810 Presence of automatic (implantable) cardiac defibrillator: Secondary | ICD-10-CM

## 2014-03-02 DIAGNOSIS — I340 Nonrheumatic mitral (valve) insufficiency: Secondary | ICD-10-CM

## 2014-03-02 DIAGNOSIS — J13 Pneumonia due to Streptococcus pneumoniae: Secondary | ICD-10-CM

## 2014-03-02 DIAGNOSIS — E1165 Type 2 diabetes mellitus with hyperglycemia: Principal | ICD-10-CM

## 2014-03-02 DIAGNOSIS — I10 Essential (primary) hypertension: Secondary | ICD-10-CM

## 2014-03-02 DIAGNOSIS — I472 Ventricular tachycardia: Secondary | ICD-10-CM

## 2014-03-02 DIAGNOSIS — Z23 Encounter for immunization: Secondary | ICD-10-CM

## 2014-03-02 LAB — CBC WITH DIFFERENTIAL/PLATELET
BASOS PCT: 0.3 % (ref 0.0–3.0)
Basophils Absolute: 0 10*3/uL (ref 0.0–0.1)
EOS PCT: 1.2 % (ref 0.0–5.0)
Eosinophils Absolute: 0.1 10*3/uL (ref 0.0–0.7)
HCT: 41.4 % (ref 39.0–52.0)
Hemoglobin: 13.2 g/dL (ref 13.0–17.0)
LYMPHS PCT: 16 % (ref 12.0–46.0)
Lymphs Abs: 1.1 10*3/uL (ref 0.7–4.0)
MCHC: 32 g/dL (ref 30.0–36.0)
MCV: 86.2 fl (ref 78.0–100.0)
Monocytes Absolute: 0.8 10*3/uL (ref 0.1–1.0)
Monocytes Relative: 12.4 % — ABNORMAL HIGH (ref 3.0–12.0)
Neutro Abs: 4.8 10*3/uL (ref 1.4–7.7)
Neutrophils Relative %: 70.1 % (ref 43.0–77.0)
Platelets: 168 10*3/uL (ref 150.0–400.0)
RBC: 4.8 Mil/uL (ref 4.22–5.81)
RDW: 15.6 % — ABNORMAL HIGH (ref 11.5–15.5)
WBC: 6.8 10*3/uL (ref 4.0–10.5)

## 2014-03-02 LAB — COMPREHENSIVE METABOLIC PANEL
ALBUMIN: 3.8 g/dL (ref 3.5–5.2)
ALT: 28 U/L (ref 0–53)
AST: 24 U/L (ref 0–37)
Alkaline Phosphatase: 48 U/L (ref 39–117)
BUN: 25 mg/dL — AB (ref 6–23)
CALCIUM: 9.4 mg/dL (ref 8.4–10.5)
CHLORIDE: 106 meq/L (ref 96–112)
CO2: 29 mEq/L (ref 19–32)
CREATININE: 1.5 mg/dL (ref 0.4–1.5)
GFR: 60.48 mL/min (ref >60.00)
GLUCOSE: 97 mg/dL (ref 70–99)
POTASSIUM: 4.3 meq/L (ref 3.5–5.1)
Sodium: 143 mEq/L (ref 135–145)
Total Bilirubin: 1 mg/dL (ref 0.2–1.2)
Total Protein: 7.1 g/dL (ref 6.0–8.3)

## 2014-03-02 LAB — LIPID PANEL
Cholesterol: 157 mg/dL (ref 0–200)
HDL: 47.2 mg/dL (ref >39.00)
LDL CALC: 91 mg/dL (ref 0–99)
NonHDL: 109.8
Total CHOL/HDL Ratio: 3
Triglycerides: 92 mg/dL (ref 0.0–149.0)
VLDL: 18.4 mg/dL (ref 0.0–40.0)

## 2014-03-02 LAB — TSH: TSH: 1.22 u[IU]/mL (ref 0.35–4.50)

## 2014-03-02 LAB — HEMOGLOBIN A1C: HEMOGLOBIN A1C: 6.5 % (ref 4.6–6.5)

## 2014-03-02 MED ORDER — AZITHROMYCIN 500 MG PO TABS: 500.0000 mg | ORAL_TABLET | Freq: Every day | ORAL | Status: DC

## 2014-03-02 NOTE — Progress Notes (Signed)
Subjective:    Patient ID: [name], male    DOB: 18-Apr-1941, 73 y.o.   MRN: [id number]  Cough This is a new problem. The current episode started in the past 7 days. The problem has been gradually worsening. The problem occurs every few minutes. The cough is productive of purulent sputum. Associated symptoms include chills, rhinorrhea and a sore throat. Pertinent negatives include no chest pain, ear congestion, ear pain, fever, headaches, heartburn, hemoptysis, myalgias, nasal congestion, postnasal drip, rash, shortness of breath, sweats, weight loss or wheezing. Nothing aggravates the symptoms. The treatment provided no relief.      Review of Systems  Constitutional: Positive for chills. Negative for fever, weight loss, diaphoresis, appetite change and fatigue.  HENT: Positive for rhinorrhea and sore throat. Negative for congestion, ear pain, facial swelling, postnasal drip, sinus pressure, sneezing, tinnitus and trouble swallowing.   Eyes: Negative.   Respiratory: Positive for cough. Negative for apnea, hemoptysis, choking, chest tightness, shortness of breath, wheezing and stridor.   Cardiovascular: Negative.  Negative for chest pain, palpitations and leg swelling.  Gastrointestinal: Negative.  Negative for heartburn, nausea, vomiting, abdominal pain, diarrhea, constipation and blood in stool.  Endocrine: Negative.   Genitourinary: Negative.   Musculoskeletal: Negative.  Negative for arthralgias, back pain, joint swelling and myalgias.  Skin: Negative.  Negative for rash.  Allergic/Immunologic: Negative.   Neurological: Negative for headaches.  Hematological: Negative.  Negative for adenopathy. Does not bruise/bleed easily.  Psychiatric/Behavioral: Negative.        Objective:   Physical Exam  Vitals reviewed. Constitutional: He is oriented to person, place, and time. He appears well-developed and well-nourished.  Non-toxic appearance. He does not have a sickly appearance. He  does not appear ill. No distress.  HENT:  Head: Normocephalic and atraumatic.  Mouth/Throat: Oropharynx is clear and moist. No oropharyngeal exudate.  Eyes: Conjunctivae are normal. Right eye exhibits no discharge. Left eye exhibits no discharge. No scleral icterus.  Neck: Normal range of motion. Neck supple. No JVD present. No tracheal deviation present. No thyromegaly present.  Cardiovascular: Normal rate, regular rhythm, normal heart sounds and intact distal pulses.  Exam reveals no gallop and no friction rub.   No murmur heard. Pulmonary/Chest: Effort normal and breath sounds normal. No stridor. No respiratory distress. He has no wheezes. He has no rales. He exhibits no tenderness.  Abdominal: Soft. Bowel sounds are normal. He exhibits no distension and no mass. There is no tenderness. There is no rebound and no guarding.  Musculoskeletal: Normal range of motion. He exhibits no edema and no tenderness.  Lymphadenopathy:    He has no cervical adenopathy.  Neurological: He is oriented to person, place, and time.  Skin: Skin is warm and dry. No rash noted. He is not diaphoretic. No erythema. No pallor.     Lab Results  Component Value Date   WBC 5.1 09/11/2012   HGB 12.8* 09/11/2012   HCT 39.0 09/11/2012   PLT 140.0* 09/11/2012   GLUCOSE 118* 09/18/2013   CHOL 141 01/14/2013   TRIG 106.0 01/14/2013   HDL 44.90 01/14/2013   LDLCALC 75 01/14/2013   ALT 35 01/14/2013   AST 26 01/14/2013   NA 140 09/18/2013   K 4.1 09/18/2013   CL 105 09/18/2013   CREATININE 1.4 09/18/2013   BUN 28* 09/18/2013   CO2 27 09/18/2013   TSH 1.46 03/12/2012   PSA <0.01 ng/mL* 11/09/2008   INR 4.4 RATIO* 06/25/2008   HGBA1C 6.5 09/18/2013  MICROALBUR 0.4 05/20/2013       Assessment & Plan:

## 2014-03-02 NOTE — Progress Notes (Signed)
Pre visit review using our clinic review tool, if applicable. No additional management support is needed unless otherwise documented below in the visit note. 

## 2014-03-02 NOTE — Patient Instructions (Signed)
Cough, Adult  A cough is a reflex that helps clear your throat and airways. It can help heal the body or may be a reaction to an irritated airway. A cough may only last 2 or 3 weeks (acute) or may last more than 8 weeks (chronic).  CAUSES Acute cough:  Viral or bacterial infections. Chronic cough:  Infections.  Allergies.  Asthma.  Post-nasal drip.  Smoking.  Heartburn or acid reflux.  Some medicines.  Chronic lung problems (COPD).  Cancer. SYMPTOMS   Cough.  Fever.  Chest pain.  Increased breathing rate.  High-pitched whistling sound when breathing (wheezing).  Colored mucus that you cough up (sputum). TREATMENT   A bacterial cough may be treated with antibiotic medicine.  A viral cough must run its course and will not respond to antibiotics.  Your caregiver may recommend other treatments if you have a chronic cough. HOME CARE INSTRUCTIONS   Only take over-the-counter or prescription medicines for pain, discomfort, or fever as directed by your caregiver. Use cough suppressants only as directed by your caregiver.  Use a cold steam vaporizer or humidifier in your bedroom or home to help loosen secretions.  Sleep in a semi-upright position if your cough is worse at night.  Rest as needed.  Stop smoking if you smoke. SEEK IMMEDIATE MEDICAL CARE IF:   You have pus in your sputum.  Your cough starts to worsen.  You cannot control your cough with suppressants and are losing sleep.  You begin coughing up blood.  You have difficulty breathing.  You develop pain which is getting worse or is uncontrolled with medicine.  You have a fever. MAKE SURE YOU:   Understand these instructions.  Will watch your condition.  Will get help right away if you are not doing well or get worse. Document Released: 12/02/2010 Document Revised: 08/28/2011 Document Reviewed: 12/02/2010 Exit[employer] Patient Information 2015 [employer]. This information is not intended  to replace advice given to you by your health care provider. Make sure you discuss any questions you have with your health care provider.  

## 2014-03-03 ENCOUNTER — Encounter: Payer: Self-pay | Admitting: Internal Medicine

## 2014-03-03 LAB — DIGOXIN LEVEL: DIGOXIN LVL: 0.7 ng/mL — AB (ref 0.8–2.0)

## 2014-03-05 NOTE — Assessment & Plan Note (Signed)
He has achieved his LDL goal 

## 2014-03-05 NOTE — Assessment & Plan Note (Signed)
His BP is well controlled Lytes and renal function are stable 

## 2014-03-05 NOTE — Assessment & Plan Note (Signed)
I will treat the infection with zithromax 

## 2014-03-05 NOTE — Assessment & Plan Note (Signed)
His blood sugars are well controlled 

## 2014-03-09 ENCOUNTER — Telehealth: Payer: Self-pay | Admitting: Internal Medicine

## 2014-03-09 NOTE — Telephone Encounter (Signed)
Received 5 pages of medical records from [facility], sent to Dr. Ronnald Ramp. 03/09/14/ss.

## 2014-03-23 ENCOUNTER — Encounter: Payer: Self-pay | Admitting: Internal Medicine

## 2014-03-23 ENCOUNTER — Ambulatory Visit (INDEPENDENT_AMBULATORY_CARE_PROVIDER_SITE_OTHER): Payer: Medicare Other | Admitting: *Deleted

## 2014-03-23 DIAGNOSIS — I420 Dilated cardiomyopathy: Secondary | ICD-10-CM

## 2014-03-23 LAB — MDC_IDC_ENUM_SESS_TYPE_REMOTE
Battery Voltage: 2.59 V
Brady Statistic RV Percent Paced: 1 %
Date Time Interrogation Session: 20151005061217
HIGH POWER IMPEDANCE MEASURED VALUE: 49 Ohm
Implantable Pulse Generator Serial Number: 665247
Lead Channel Impedance Value: 350 Ohm
Lead Channel Pacing Threshold Pulse Width: 0.5 ms
Lead Channel Sensing Intrinsic Amplitude: 12 mV
MDC IDC MSMT BATTERY REMAINING LONGEVITY: 38 mo
MDC IDC MSMT LEADCHNL RV PACING THRESHOLD AMPLITUDE: 1.25 V
MDC IDC SET LEADCHNL RV PACING AMPLITUDE: 2.5 V
MDC IDC SET LEADCHNL RV PACING PULSEWIDTH: 0.5 ms
MDC IDC SET LEADCHNL RV SENSING SENSITIVITY: 0.3 mV
MDC IDC SET ZONE DETECTION INTERVAL: 300 ms

## 2014-03-23 NOTE — Progress Notes (Signed)
Remote ICD transmission.   

## 2014-03-24 ENCOUNTER — Ambulatory Visit (INDEPENDENT_AMBULATORY_CARE_PROVIDER_SITE_OTHER): Payer: Medicare Other | Admitting: Internal Medicine

## 2014-03-24 ENCOUNTER — Encounter: Payer: Self-pay | Admitting: Internal Medicine

## 2014-03-24 VITALS — BP 106/68 | HR 86 | Temp 97.8°F | Resp 16 | Ht 69.0 in | Wt 209.0 lb

## 2014-03-24 DIAGNOSIS — I10 Essential (primary) hypertension: Secondary | ICD-10-CM

## 2014-03-24 NOTE — Assessment & Plan Note (Signed)
His BP is well controlled 

## 2014-03-24 NOTE — Progress Notes (Signed)
Pre visit review using our clinic review tool, if applicable. No additional management support is needed unless otherwise documented below in the visit note. 

## 2014-03-24 NOTE — Progress Notes (Signed)
   Subjective:    Patient ID: [name], male    DOB: 08/26/1940, 73 y.o.   MRN: [id number]  HPI Comments: He returns for f/up, all of his URI symptoms have resolved. He feels well and offers no complaints.     Review of Systems  Constitutional: Negative.  Negative for fever, chills, diaphoresis, appetite change and fatigue.  HENT: Negative.  Negative for congestion, facial swelling, postnasal drip, rhinorrhea, sinus pressure, sore throat and trouble swallowing.   Eyes: Negative.   Respiratory: Negative for cough, choking, chest tightness, shortness of breath and stridor.   Cardiovascular: Negative.  Negative for chest pain, palpitations and leg swelling.  Gastrointestinal: Negative.  Negative for nausea, vomiting, abdominal pain, diarrhea, constipation and blood in stool.  Endocrine: Negative.   Genitourinary: Negative.   Musculoskeletal: Negative.  Negative for arthralgias, back pain, myalgias and neck pain.  Skin: Negative.  Negative for pallor and rash.  Allergic/Immunologic: Negative.   Neurological: Negative.   Hematological: Negative.  Negative for adenopathy. Does not bruise/bleed easily.  Psychiatric/Behavioral: Negative.        Objective:   Physical Exam  Vitals reviewed. Constitutional: He is oriented to person, place, and time. He appears well-developed and well-nourished. No distress.  HENT:  Head: Normocephalic and atraumatic.  Mouth/Throat: Oropharynx is clear and moist. No oropharyngeal exudate.  Eyes: Conjunctivae are normal. Right eye exhibits no discharge. Left eye exhibits no discharge. No scleral icterus.  Neck: Normal range of motion. Neck supple. No JVD present. No tracheal deviation present. No thyromegaly present.  Cardiovascular: Normal rate, regular rhythm, normal heart sounds and intact distal pulses.  Exam reveals no gallop and no friction rub.   No murmur heard. Pulmonary/Chest: Effort normal and breath sounds normal. No stridor. No respiratory  distress. He has no wheezes. He has no rales. He exhibits no tenderness.  Abdominal: Soft. Bowel sounds are normal. He exhibits no distension and no mass. There is no tenderness. There is no rebound and no guarding.  Musculoskeletal: Normal range of motion. He exhibits no edema and no tenderness.  Lymphadenopathy:    He has no cervical adenopathy.  Neurological: He is oriented to person, place, and time.  Skin: Skin is warm and dry. No rash noted. He is not diaphoretic. No erythema. No pallor.          Assessment & Plan:

## 2014-03-24 NOTE — Patient Instructions (Signed)

## 2014-04-09 NOTE — Progress Notes (Signed)
HPI:FU NICM; normal coronary arteries by cardiac catheterization in the 1990s, s/p AICD, prior DVT. Nuclear study 8/08: no ischemia, inf/lat/dAnt/apical scar. Echo 09/26/11: EF 25-30%, mod MR, mod LAE, PASP 47. Since last seen, the patient has dyspnea with more extreme activities but not with routine activities. It is relieved with rest. It is not associated with chest pain. There is no orthopnea, PND or pedal edema. There is no syncope or palpitations. There is no exertional chest pain.    Current Outpatient Prescriptions  Medication Sig Dispense Refill  . aspirin 81 MG tablet Take 81 mg by mouth daily.        . carvedilol (COREG) 25 MG tablet Take 25 mg by mouth 2 (two) times daily with a meal.        . COLCRYS 0.6 MG tablet       . digoxin (LANOXIN) 0.125 MG tablet Take 125 mcg by mouth daily.        . furosemide (LASIX) 40 MG tablet Take 40 mg by mouth daily. Take daily as directed      . methotrexate (RHEUMATREX) 2.5 MG tablet Take 6 tablets every Wednesday      . simvastatin (ZOCOR) 20 MG tablet Take 10 mg by mouth at bedtime.        . TRADJENTA 5 MG TABS tablet TAKE 1 TABLET BY MOUTH DAILY FOR DIABETES  90 tablet  2  . ULORIC 80 MG TABS TAKE 1 TABLET (80 MG TOTAL) BY MOUTH DAILY.  90 tablet  2  . lisinopril (PRINIVIL,ZESTRIL) 5 MG tablet Take 1 tablet (5 mg total) by mouth daily.  30 tablet  11   No current facility-administered medications for this visit.     Past Medical History  Diagnosis Date  . AICD (automatic cardioverter/defibrillator) present   . Obesity   . Prostate cancer   . Glucose intolerance (impaired glucose tolerance)   . Bursitis of elbow     left  . Osteoarthritis   . HTN (hypertension)   . HLD (hyperlipidemia)   . History of DVT (deep vein thrombosis)   . Chronic systolic heart failure     echo 4/13: EF 25-30%, mod MR, mod LAE, PASP 47  . NICM (nonischemic cardiomyopathy)     normal cors by cath in 1990s;  nuclear 8/08: no ischemia; inf/lat/dist  ant/apical scar  . Moderate mitral regurgitation     echo 4/13    Past Surgical History  Procedure Laterality Date  . Transurethral resection of prostate    . Cardiac defibrillator placement  10/09  . Hernia repair    . Robot assisted laparoscopic radical prostatectomy  6/08    History   Social History  . Marital Status: Married    Spouse Name: N/A    Number of Children: N/A  . Years of Education: N/A   Occupational History  . Tour manager    Social History Main Topics  . Smoking status: Former Smoker -- 0.50 packs/day for 10 years    Types: Cigarettes    Quit date: 11/01/1975  . Smokeless tobacco: Never Used     Comment: quit over 30 years ago   . Alcohol Use: No     Comment: occasionally  . Drug Use: No  . Sexual Activity: Not Currently   Other Topics Concern  . Not on file   Social History Narrative  . No narrative on file    ROS: Arthralgias but no fevers or chills, productive cough, hemoptysis,  dysphasia, odynophagia, melena, hematochezia, dysuria, hematuria, rash, seizure activity, orthopnea, PND, pedal edema, claudication. Remaining systems are negative.  Physical Exam: Well-developed well-nourished in no acute distress.  Skin is warm and dry.  HEENT is normal.  Neck is supple.  Chest is clear to auscultation with normal expansion. 2/6 systolic murmur apex Cardiovascular exam is regular rate and rhythm.  Abdominal exam nontender or distended. No masses palpated. Positive bruit Extremities show no edema. neuro grossly intact  ECG Normal sinus rhythm at a rate of 90. Left posterior fascicular block, inferior lateral T-wave inversion.

## 2014-04-10 ENCOUNTER — Ambulatory Visit (INDEPENDENT_AMBULATORY_CARE_PROVIDER_SITE_OTHER): Payer: Medicare Other | Admitting: Cardiology

## 2014-04-10 ENCOUNTER — Encounter: Payer: Self-pay | Admitting: *Deleted

## 2014-04-10 ENCOUNTER — Encounter: Payer: Self-pay | Admitting: Cardiology

## 2014-04-10 VITALS — BP 110/72 | HR 90 | Ht 69.0 in | Wt 211.4 lb

## 2014-04-10 DIAGNOSIS — I059 Rheumatic mitral valve disease, unspecified: Secondary | ICD-10-CM

## 2014-04-10 DIAGNOSIS — R0989 Other specified symptoms and signs involving the circulatory and respiratory systems: Secondary | ICD-10-CM

## 2014-04-10 DIAGNOSIS — I420 Dilated cardiomyopathy: Secondary | ICD-10-CM

## 2014-04-10 DIAGNOSIS — I5022 Chronic systolic (congestive) heart failure: Secondary | ICD-10-CM

## 2014-04-10 NOTE — Assessment & Plan Note (Signed)
Patient is euvolemic on examination.Continue present dose of Lasix. 

## 2014-04-10 NOTE — Patient Instructions (Signed)
Your physician wants you to follow-up in: [place] will receive a reminder letter in the mail two months in advance. If you don't receive a letter, please call our office to schedule the follow-up appointment.   Your physician has requested that you have an echocardiogram. Echocardiography is a painless test that uses sound waves to create images of your heart. It provides your doctor with information about the size and shape of your heart and how well your heart's chambers and valves are working. This procedure takes approximately one hour. There are no restrictions for this procedure.   Your physician has requested that you have an abdominal aorta duplex. During this test, an ultrasound is used to evaluate the aorta. Allow 30 minutes for this exam. Do not eat after midnight the day before and avoid carbonated beverages

## 2014-04-10 NOTE — Assessment & Plan Note (Signed)
Blood pressure controlled. Continue present medications. 

## 2014-04-10 NOTE — Assessment & Plan Note (Signed)
Repeat echocardiogram. 

## 2014-04-10 NOTE — Assessment & Plan Note (Signed)
Followed by electrophysiology. 

## 2014-04-10 NOTE — Assessment & Plan Note (Signed)
Continue statin. 

## 2014-04-10 NOTE — Assessment & Plan Note (Signed)
Abdominal ultrasound to exclude aneurysm. 

## 2014-04-10 NOTE — Assessment & Plan Note (Signed)
Continue ACE inhibitor and beta blocker. Renal function and potassium monitored by primary care.

## 2014-04-14 ENCOUNTER — Other Ambulatory Visit: Payer: Self-pay | Admitting: Internal Medicine

## 2014-04-23 ENCOUNTER — Ambulatory Visit (HOSPITAL_COMMUNITY)
Admission: RE | Admit: 2014-04-23 | Discharge: 2014-04-23 | Disposition: A | Discharge status: Home / Self Care | Payer: Medicare Other | Source: Ambulatory Visit | Attending: Cardiology | Admitting: Cardiology

## 2014-04-23 ENCOUNTER — Other Ambulatory Visit (HOSPITAL_COMMUNITY): Payer: Self-pay | Admitting: Cardiology

## 2014-04-23 ENCOUNTER — Ambulatory Visit (HOSPITAL_BASED_OUTPATIENT_CLINIC_OR_DEPARTMENT_OTHER)
Admission: RE | Admit: 2014-04-23 | Discharge: 2014-04-23 | Disposition: A | Discharge status: Home / Self Care | Payer: Medicare Other | Source: Ambulatory Visit | Attending: Cardiology | Admitting: Cardiology

## 2014-04-23 DIAGNOSIS — I420 Dilated cardiomyopathy: Secondary | ICD-10-CM

## 2014-04-23 DIAGNOSIS — R0989 Other specified symptoms and signs involving the circulatory and respiratory systems: Secondary | ICD-10-CM | POA: Insufficient documentation

## 2014-04-23 DIAGNOSIS — I429 Cardiomyopathy, unspecified: Secondary | ICD-10-CM | POA: Insufficient documentation

## 2014-04-23 DIAGNOSIS — I341 Nonrheumatic mitral (valve) prolapse: Secondary | ICD-10-CM

## 2014-04-23 DIAGNOSIS — Z95810 Presence of automatic (implantable) cardiac defibrillator: Secondary | ICD-10-CM | POA: Insufficient documentation

## 2014-04-23 DIAGNOSIS — E785 Hyperlipidemia, unspecified: Secondary | ICD-10-CM | POA: Diagnosis not present

## 2014-04-23 DIAGNOSIS — Z87891 Personal history of nicotine dependence: Secondary | ICD-10-CM | POA: Insufficient documentation

## 2014-04-23 DIAGNOSIS — I10 Essential (primary) hypertension: Secondary | ICD-10-CM | POA: Diagnosis not present

## 2014-04-23 DIAGNOSIS — Z86718 Personal history of other venous thrombosis and embolism: Secondary | ICD-10-CM | POA: Diagnosis not present

## 2014-04-23 DIAGNOSIS — I059 Rheumatic mitral valve disease, unspecified: Secondary | ICD-10-CM

## 2014-04-23 NOTE — Progress Notes (Signed)
Abdominal Aortic Duplex Completed °Brianna L Mazza,RVT °

## 2014-04-23 NOTE — Progress Notes (Signed)
2D Echocardiogram Complete.  04/23/2014   Leiyah Maultsby King, RDCS

## 2014-06-23 ENCOUNTER — Ambulatory Visit (INDEPENDENT_AMBULATORY_CARE_PROVIDER_SITE_OTHER): Payer: Medicare Other | Admitting: *Deleted

## 2014-06-23 DIAGNOSIS — I420 Dilated cardiomyopathy: Secondary | ICD-10-CM

## 2014-06-23 NOTE — Progress Notes (Signed)
Remote ICD transmission.   

## 2014-06-24 LAB — MDC_IDC_ENUM_SESS_TYPE_REMOTE
Battery Voltage: 2.59 V
HighPow Impedance: 51 Ohm
Implantable Pulse Generator Serial Number: 665247
Lead Channel Setting Pacing Pulse Width: 0.5 ms
Lead Channel Setting Sensing Sensitivity: 0.3 mV
MDC IDC MSMT BATTERY REMAINING LONGEVITY: 38 mo
MDC IDC MSMT LEADCHNL RV IMPEDANCE VALUE: 350 Ohm
MDC IDC MSMT LEADCHNL RV PACING THRESHOLD AMPLITUDE: 1.25 V
MDC IDC MSMT LEADCHNL RV PACING THRESHOLD PULSEWIDTH: 0.5 ms
MDC IDC MSMT LEADCHNL RV SENSING INTR AMPL: 12 mV
MDC IDC SESS DTM: 20160105084806
MDC IDC SET LEADCHNL RV PACING AMPLITUDE: 2.5 V
MDC IDC SET ZONE DETECTION INTERVAL: 300 ms
MDC IDC STAT BRADY RV PERCENT PACED: 1 %

## 2014-07-03 ENCOUNTER — Encounter: Payer: Self-pay | Admitting: Cardiology

## 2014-07-08 ENCOUNTER — Encounter: Payer: Self-pay | Admitting: Internal Medicine

## 2014-07-17 ENCOUNTER — Encounter: Payer: Self-pay | Admitting: Cardiology

## 2014-07-20 DIAGNOSIS — I472 Ventricular tachycardia, unspecified: Secondary | ICD-10-CM

## 2014-07-20 HISTORY — DX: Ventricular tachycardia, unspecified: I47.20

## 2014-07-20 HISTORY — DX: Ventricular tachycardia: I47.2

## 2014-07-28 ENCOUNTER — Encounter: Payer: Self-pay | Admitting: Internal Medicine

## 2014-07-28 ENCOUNTER — Other Ambulatory Visit (INDEPENDENT_AMBULATORY_CARE_PROVIDER_SITE_OTHER): Payer: Medicare Other

## 2014-07-28 ENCOUNTER — Ambulatory Visit (INDEPENDENT_AMBULATORY_CARE_PROVIDER_SITE_OTHER): Payer: Medicare Other | Admitting: Internal Medicine

## 2014-07-28 ENCOUNTER — Ambulatory Visit (INDEPENDENT_AMBULATORY_CARE_PROVIDER_SITE_OTHER)
Admission: RE | Admit: 2014-07-28 | Discharge: 2014-07-28 | Disposition: A | Discharge status: Home / Self Care | Payer: Medicare Other | Source: Ambulatory Visit | Attending: Internal Medicine | Admitting: Internal Medicine

## 2014-07-28 VITALS — BP 106/72 | HR 85 | Temp 97.9°F | Resp 16 | Wt 209.0 lb

## 2014-07-28 DIAGNOSIS — K5909 Other constipation: Secondary | ICD-10-CM

## 2014-07-28 DIAGNOSIS — E1121 Type 2 diabetes mellitus with diabetic nephropathy: Secondary | ICD-10-CM

## 2014-07-28 DIAGNOSIS — R100 Acute abdomen: Secondary | ICD-10-CM

## 2014-07-28 DIAGNOSIS — I10 Essential (primary) hypertension: Secondary | ICD-10-CM

## 2014-07-28 DIAGNOSIS — R109 Unspecified abdominal pain: Secondary | ICD-10-CM

## 2014-07-28 LAB — URINALYSIS, ROUTINE W REFLEX MICROSCOPIC
Bilirubin Urine: NEGATIVE
Hgb urine dipstick: NEGATIVE
Ketones, ur: NEGATIVE
LEUKOCYTES UA: NEGATIVE
Nitrite: NEGATIVE
RBC / HPF: NONE SEEN (ref 0–?)
SPECIFIC GRAVITY, URINE: 1.02 (ref 1.000–1.030)
Total Protein, Urine: NEGATIVE
URINE GLUCOSE: NEGATIVE
UROBILINOGEN UA: 0.2 (ref 0.0–1.0)
pH: 6 (ref 5.0–8.0)

## 2014-07-28 LAB — COMPREHENSIVE METABOLIC PANEL
ALT: 22 U/L (ref 0–53)
AST: 18 U/L (ref 0–37)
Albumin: 4 g/dL (ref 3.5–5.2)
Alkaline Phosphatase: 47 U/L (ref 39–117)
BILIRUBIN TOTAL: 0.8 mg/dL (ref 0.2–1.2)
BUN: 43 mg/dL — ABNORMAL HIGH (ref 6–23)
CHLORIDE: 108 meq/L (ref 96–112)
CO2: 26 mEq/L (ref 19–32)
Calcium: 9.1 mg/dL (ref 8.4–10.5)
Creatinine, Ser: 1.52 mg/dL — ABNORMAL HIGH (ref 0.40–1.50)
GFR: 58.13 mL/min — AB (ref >60.00)
Glucose, Bld: 133 mg/dL — ABNORMAL HIGH (ref 70–99)
Potassium: 4.4 mEq/L (ref 3.5–5.1)
SODIUM: 140 meq/L (ref 135–145)
TOTAL PROTEIN: 7.1 g/dL (ref 6.0–8.3)

## 2014-07-28 LAB — CBC WITH DIFFERENTIAL/PLATELET
BASOS PCT: 0.6 % (ref 0.0–3.0)
Basophils Absolute: 0 10*3/uL (ref 0.0–0.1)
EOS ABS: 0.1 10*3/uL (ref 0.0–0.7)
Eosinophils Relative: 2.1 % (ref 0.0–5.0)
HEMATOCRIT: 39.2 % (ref 39.0–52.0)
HEMOGLOBIN: 12.8 g/dL — AB (ref 13.0–17.0)
LYMPHS ABS: 1.4 10*3/uL (ref 0.7–4.0)
LYMPHS PCT: 24.9 % (ref 12.0–46.0)
MCHC: 32.7 g/dL (ref 30.0–36.0)
MCV: 82.6 fl (ref 78.0–100.0)
Monocytes Absolute: 0.8 10*3/uL (ref 0.1–1.0)
Monocytes Relative: 14.2 % — ABNORMAL HIGH (ref 3.0–12.0)
NEUTROS ABS: 3.4 10*3/uL (ref 1.4–7.7)
Neutrophils Relative %: 58.2 % (ref 43.0–77.0)
Platelets: 189 10*3/uL (ref 150.0–400.0)
RBC: 4.74 Mil/uL (ref 4.22–5.81)
RDW: 16.5 % — AB (ref 11.5–15.5)
WBC: 5.8 10*3/uL (ref 4.0–10.5)

## 2014-07-28 LAB — AMYLASE: AMYLASE: 85 U/L (ref 27–131)

## 2014-07-28 LAB — HEMOGLOBIN A1C: HEMOGLOBIN A1C: 6.6 % — AB (ref 4.6–6.5)

## 2014-07-28 LAB — LIPASE: LIPASE: 24 U/L (ref 11.0–59.0)

## 2014-07-28 LAB — MICROALBUMIN / CREATININE URINE RATIO
Creatinine,U: 155.2 mg/dL
MICROALB/CREAT RATIO: 0.6 mg/g (ref 0.0–30.0)
Microalb, Ur: 1 mg/dL (ref 0.0–1.9)

## 2014-07-28 LAB — TSH: TSH: 2.22 u[IU]/mL (ref 0.35–4.50)

## 2014-07-28 MED ORDER — LINACLOTIDE 145 MCG PO CAPS: 145.0000 ug | ORAL_CAPSULE | Freq: Every day | ORAL | Status: DC

## 2014-07-28 NOTE — Patient Instructions (Signed)

## 2014-07-28 NOTE — Progress Notes (Signed)
Pre visit review using our clinic review tool, if applicable. No additional management support is needed unless otherwise documented below in the visit note. 

## 2014-07-29 ENCOUNTER — Encounter: Payer: Self-pay | Admitting: Internal Medicine

## 2014-07-30 NOTE — Assessment & Plan Note (Signed)
His blood sugars are adequately well controlled Renal function is stable

## 2014-07-30 NOTE — Assessment & Plan Note (Signed)
He does not appear to have an acute abd process based on ROS, exam, xray, labs Will treat the constipation with linzess and will follow from there

## 2014-07-30 NOTE — Progress Notes (Signed)
Subjective:    Patient ID: [name], male    DOB: October 09, 1940, 74 y.o.   MRN: [id number]  Abdominal Pain This is a recurrent problem. The current episode started 1 to 4 weeks ago. The onset quality is gradual. The problem occurs intermittently. The problem has been unchanged. The pain is located in the LLQ and RLQ. The pain is at a severity of 1/10. The pain is mild. The quality of the pain is dull. The abdominal pain does not radiate. Associated symptoms include constipation. Pertinent negatives include no anorexia, arthralgias, belching, diarrhea, dysuria, fever, flatus, frequency, headaches, hematochezia, hematuria, melena, myalgias, nausea, vomiting or weight loss. Nothing aggravates the pain. The pain is relieved by bowel movements.  Diabetes Pertinent negatives for hypoglycemia include no headaches. Pertinent negatives for diabetes include no chest pain, no fatigue, no polydipsia, no polyphagia, no polyuria and no weight loss.      Review of Systems  Constitutional: Negative.  Negative for fever, chills, weight loss, diaphoresis, appetite change and fatigue.  HENT: Negative.   Eyes: Negative.   Respiratory: Negative.  Negative for cough, choking, chest tightness, shortness of breath and stridor.   Cardiovascular: Negative.  Negative for chest pain, palpitations and leg swelling.  Gastrointestinal: Positive for abdominal pain and constipation. Negative for nausea, vomiting, diarrhea, blood in stool, melena, hematochezia, abdominal distention, anal bleeding, rectal pain, anorexia and flatus.  Endocrine: Negative.  Negative for polydipsia, polyphagia and polyuria.  Genitourinary: Negative.  Negative for dysuria, urgency, frequency, hematuria, flank pain, decreased urine volume, discharge, penile swelling, scrotal swelling, enuresis, difficulty urinating, genital sores, penile pain and testicular pain.  Musculoskeletal: Negative.  Negative for myalgias and arthralgias.  Skin: Negative.     Allergic/Immunologic: Negative.   Neurological: Negative.  Negative for headaches.  Hematological: Negative.  Negative for adenopathy. Does not bruise/bleed easily.  Psychiatric/Behavioral: Negative.        Objective:   Physical Exam  Constitutional: He appears well-developed and well-nourished.  Non-toxic appearance. He does not have a sickly appearance. He does not appear ill. No distress.  HENT:  Head: Normocephalic and atraumatic.  Mouth/Throat: Oropharynx is clear and moist. No oropharyngeal exudate.  Eyes: Conjunctivae are normal. Right eye exhibits no discharge. Left eye exhibits no discharge. No scleral icterus.  Neck: Normal range of motion. Neck supple. No JVD present. No tracheal deviation present. No thyromegaly present.  Cardiovascular: Normal rate, regular rhythm, normal heart sounds and intact distal pulses.  Exam reveals no gallop and no friction rub.   No murmur heard. Pulmonary/Chest: Effort normal and breath sounds normal. No stridor. No respiratory distress. He has no rales. He exhibits no tenderness.  Abdominal: Soft. Normal appearance and bowel sounds are normal. He exhibits no shifting dullness, no distension, no pulsatile liver, no fluid wave, no abdominal bruit, no ascites, no pulsatile midline mass and no mass. There is no hepatosplenomegaly, splenomegaly or hepatomegaly. There is no tenderness. There is no rebound and no CVA tenderness. No hernia. Hernia confirmed negative in the ventral area, confirmed negative in the right inguinal area and confirmed negative in the left inguinal area.  Genitourinary: Testes normal and penis normal. Right testis shows no mass, no swelling and no tenderness. Right testis is descended. Left testis shows no mass, no swelling and no tenderness. Left testis is descended. Uncircumcised. No phimosis, paraphimosis, hypospadias, penile erythema or penile tenderness. No discharge found.  Lymphadenopathy:    He has no cervical adenopathy.        Right: No  inguinal adenopathy present.       Left: No inguinal adenopathy present.  Skin: He is not diaphoretic.  Vitals reviewed.   Lab Results  Component Value Date   WBC 5.8 07/28/2014   HGB 12.8* 07/28/2014   HCT 39.2 07/28/2014   PLT 189.0 07/28/2014   GLUCOSE 133* 07/28/2014   CHOL 157 03/02/2014   TRIG 92.0 03/02/2014   HDL 47.20 03/02/2014   LDLCALC 91 03/02/2014   ALT 22 07/28/2014   AST 18 07/28/2014   NA 140 07/28/2014   K 4.4 07/28/2014   CL 108 07/28/2014   CREATININE 1.52* 07/28/2014   BUN 43* 07/28/2014   CO2 26 07/28/2014   TSH 2.22 07/28/2014   PSA <0.01 ng/mL* 11/09/2008   INR 4.4 RATIO* 06/25/2008   HGBA1C 6.6* 07/28/2014   MICROALBUR 1.0 07/28/2014        Assessment & Plan:

## 2014-07-30 NOTE — Assessment & Plan Note (Signed)
Exam and plain films are normal - no SBO or ileus Will check his labs to screen for secondary causes of constipation Will start linzess for symptom relief

## 2014-08-12 ENCOUNTER — Encounter (HOSPITAL_COMMUNITY): Payer: Self-pay | Admitting: Emergency Medicine

## 2014-08-12 ENCOUNTER — Inpatient Hospital Stay (HOSPITAL_COMMUNITY)
Admission: EM | Admit: 2014-08-12 | Discharge: 2014-08-16 | DRG: 287 | Disposition: A | Discharge status: Home / Self Care | Payer: Medicare Other | Attending: Cardiology | Admitting: Cardiology

## 2014-08-12 DIAGNOSIS — I472 Ventricular tachycardia, unspecified: Secondary | ICD-10-CM

## 2014-08-12 DIAGNOSIS — I272 Other secondary pulmonary hypertension: Secondary | ICD-10-CM | POA: Diagnosis present

## 2014-08-12 DIAGNOSIS — E785 Hyperlipidemia, unspecified: Secondary | ICD-10-CM | POA: Diagnosis present

## 2014-08-12 DIAGNOSIS — Z4502 Encounter for adjustment and management of automatic implantable cardiac defibrillator: Secondary | ICD-10-CM | POA: Diagnosis present

## 2014-08-12 DIAGNOSIS — Z95810 Presence of automatic (implantable) cardiac defibrillator: Secondary | ICD-10-CM | POA: Diagnosis not present

## 2014-08-12 DIAGNOSIS — I429 Cardiomyopathy, unspecified: Secondary | ICD-10-CM | POA: Diagnosis present

## 2014-08-12 DIAGNOSIS — I248 Other forms of acute ischemic heart disease: Secondary | ICD-10-CM | POA: Diagnosis present

## 2014-08-12 DIAGNOSIS — I5022 Chronic systolic (congestive) heart failure: Secondary | ICD-10-CM | POA: Diagnosis present

## 2014-08-12 DIAGNOSIS — R778 Other specified abnormalities of plasma proteins: Secondary | ICD-10-CM | POA: Diagnosis present

## 2014-08-12 DIAGNOSIS — Z8546 Personal history of malignant neoplasm of prostate: Secondary | ICD-10-CM

## 2014-08-12 DIAGNOSIS — E1122 Type 2 diabetes mellitus with diabetic chronic kidney disease: Secondary | ICD-10-CM | POA: Diagnosis present

## 2014-08-12 DIAGNOSIS — R7989 Other specified abnormal findings of blood chemistry: Secondary | ICD-10-CM

## 2014-08-12 DIAGNOSIS — Z7982 Long term (current) use of aspirin: Secondary | ICD-10-CM | POA: Diagnosis not present

## 2014-08-12 DIAGNOSIS — I340 Nonrheumatic mitral (valve) insufficiency: Secondary | ICD-10-CM | POA: Diagnosis present

## 2014-08-12 DIAGNOSIS — Z86718 Personal history of other venous thrombosis and embolism: Secondary | ICD-10-CM | POA: Diagnosis not present

## 2014-08-12 DIAGNOSIS — E669 Obesity, unspecified: Secondary | ICD-10-CM | POA: Diagnosis present

## 2014-08-12 DIAGNOSIS — I428 Other cardiomyopathies: Secondary | ICD-10-CM

## 2014-08-12 DIAGNOSIS — Z0389 Encounter for observation for other suspected diseases and conditions ruled out: Secondary | ICD-10-CM | POA: Diagnosis not present

## 2014-08-12 DIAGNOSIS — Z87891 Personal history of nicotine dependence: Secondary | ICD-10-CM

## 2014-08-12 DIAGNOSIS — N183 Chronic kidney disease, stage 3 unspecified: Secondary | ICD-10-CM | POA: Diagnosis present

## 2014-08-12 DIAGNOSIS — I10 Essential (primary) hypertension: Secondary | ICD-10-CM | POA: Diagnosis not present

## 2014-08-12 DIAGNOSIS — E1129 Type 2 diabetes mellitus with other diabetic kidney complication: Secondary | ICD-10-CM | POA: Diagnosis present

## 2014-08-12 DIAGNOSIS — I270 Primary pulmonary hypertension: Secondary | ICD-10-CM | POA: Diagnosis not present

## 2014-08-12 DIAGNOSIS — IMO0001 Reserved for inherently not codable concepts without codable children: Secondary | ICD-10-CM

## 2014-08-12 HISTORY — DX: Ventricular tachycardia: I47.2

## 2014-08-12 LAB — CBC
HCT: 34.4 % — ABNORMAL LOW (ref 39.0–52.0)
Hemoglobin: 11.2 g/dL — ABNORMAL LOW (ref 13.0–17.0)
MCH: 26.5 pg (ref 26.0–34.0)
MCHC: 32.6 g/dL (ref 30.0–36.0)
MCV: 81.5 fL (ref 78.0–100.0)
Platelets: 156 10*3/uL (ref 150–400)
RBC: 4.22 MIL/uL (ref 4.22–5.81)
RDW: 15.8 % — AB (ref 11.5–15.5)
WBC: 5.8 10*3/uL (ref 4.0–10.5)

## 2014-08-12 LAB — I-STAT TROPONIN, ED: TROPONIN I, POC: 0.22 ng/mL — AB (ref 0.00–0.08)

## 2014-08-12 NOTE — ED Provider Notes (Signed)
CSN: [id number]     Arrival date & time 08/12/14  2220 History  This chart was scribed for Veryl Speak, MD by Einar Pheasant, ED Scribe. This patient was seen in room A12C/A12C and the patient's care was started at 11:18 PM.    Chief Complaint  Patient presents with  . Irregular Heart Beat  . AICD Problem   The history is provided by the patient and medical records. No language interpreter was used.   HPI Comments: [name] is a 74 y.o. male brought in by ambulance, who presents to the Emergency Department complaining of irregular heart beat. Pt comes from a home. He has a Sport and exercise psychologist. Jude defibrillator that was placed in Nov 2009. Pt reports his pacemaker firing 4 times in twenty minutes. He states that before the event he felt dizzy and became diaphoretic. Symptoms have subsided at this time. Pt denies any fever, neck pain, sore throat, visual disturbance, CP, cough, SOB, abdominal pain, nausea, emesis, diarrhea, urinary symptoms, back pain, HA, weakness, numbness and rash as associated symptoms.     Past Medical History  Diagnosis Date  . AICD (automatic cardioverter/defibrillator) present   . Obesity   . Prostate cancer   . Glucose intolerance (impaired glucose tolerance)   . Bursitis of elbow     left  . Osteoarthritis   . HTN (hypertension)   . HLD (hyperlipidemia)   . History of DVT (deep vein thrombosis)   . Chronic systolic heart failure     echo 4/13: EF 25-30%, mod MR, mod LAE, PASP 47  . NICM (nonischemic cardiomyopathy)     normal cors by cath in 1990s;  nuclear 8/08: no ischemia; inf/lat/dist ant/apical scar  . Moderate mitral regurgitation     echo 4/13   Past Surgical History  Procedure Laterality Date  . Transurethral resection of prostate    . Cardiac defibrillator placement  10/09  . Hernia repair    . Robot assisted laparoscopic radical prostatectomy  6/08   Family History  Problem Relation Age of Onset  . Cancer Father     prostate  . Heart disease Mother     History  Substance Use Topics  . Smoking status: Former Smoker -- 0.50 packs/day for 10 years    Types: Cigarettes    Quit date: 11/01/1975  . Smokeless tobacco: Never Used     Comment: quit over 30 years ago   . Alcohol Use: No     Comment: occasionally    Review of Systems  Constitutional: Positive for diaphoresis. Negative for fever, chills and appetite change.  Respiratory: Negative for cough, chest tightness, shortness of breath and wheezing.   Cardiovascular: Negative for chest pain.  Gastrointestinal: Negative for nausea, vomiting, abdominal pain and diarrhea.  Neurological: Positive for dizziness. Negative for syncope, weakness, light-headedness, numbness and headaches.  Hematological: Does not bruise/bleed easily.  Psychiatric/Behavioral: Negative for confusion.   Allergies  Spironolactone  Home Medications   Prior to Admission medications   Medication Sig Start Date End Date Taking? Authorizing Provider  aspirin 81 MG tablet Take 81 mg by mouth daily.      Historical Provider, MD  carvedilol (COREG) 25 MG tablet Take 25 mg by mouth 2 (two) times daily with a meal.      Historical Provider, MD  COLCRYS 0.6 MG tablet  03/11/14   Historical Provider, MD  digoxin (LANOXIN) 0.125 MG tablet Take 125 mcg by mouth daily.      Historical Provider, MD  furosemide (  LASIX) 40 MG tablet Take 40 mg by mouth daily. Take daily as directed 09/14/11   Thompson Grayer, MD  Linaclotide [facility]) 145 MCG CAPS capsule Take 1 capsule (145 mcg total) by mouth daily. 07/28/14   Janith Lima, MD  lisinopril (PRINIVIL,ZESTRIL) 5 MG tablet Take 1 tablet (5 mg total) by mouth daily. 09/29/11 09/15/13  Renella Cunas, MD  methotrexate (RHEUMATREX) 2.5 MG tablet Take 6 tablets every Wednesday 04/21/13   Historical Provider, MD  simvastatin (ZOCOR) 20 MG tablet Take 10 mg by mouth at bedtime.      Historical Provider, MD  TRADJENTA 5 MG TABS tablet TAKE 1 TABLET BY MOUTH DAILY FOR DIABETES    Janith Lima, MD  ULORIC 80 MG TABS TAKE 1 TABLET (80 MG TOTAL) BY MOUTH DAILY. Patient taking differently: TAKE 1 TABLET (120 MG TOTAL) BY MOUTH 3 XS DAILY. 04/14/14   Janith Lima, MD   BP 113/67 mmHg  Pulse 96  Temp(Src) 98.4 F (36.9 C) (Oral)  Resp 33  Ht 5\' 9"  (1.753 m)  Wt 205 lb (92.987 kg)  BMI 30.26 kg/m2  SpO2 97%  Physical Exam  Constitutional: He appears well-developed and well-nourished.  HENT:  Head: Normocephalic and atraumatic.  Eyes: Conjunctivae and EOM are normal. Pupils are equal, round, and reactive to light.  Neck: Neck supple.  Cardiovascular: Normal rate and regular rhythm.   No murmur heard. Pulmonary/Chest: Effort normal and breath sounds normal.  Abdominal: Soft. Bowel sounds are normal. He exhibits no distension. There is no tenderness.  Musculoskeletal: Normal range of motion. He exhibits no edema or tenderness.  Neurological: He is alert. Coordination normal.  Skin: Skin is warm and dry. No rash noted.  Psychiatric: He has a normal mood and affect.  Nursing note and vitals reviewed.   ED Course  Procedures (including critical care time)  DIAGNOSTIC STUDIES: Oxygen Saturation is 97% on RA, normal by my interpretation.    COORDINATION OF CARE: 11:21 PM- Pt advised of plan for treatment and pt agrees.  Labs Review Labs Reviewed  CBC - Abnormal; Notable for the following:    Hemoglobin 11.2 (*)    HCT 34.4 (*)    RDW 15.8 (*)    All other components within normal limits  I-STAT TROPOININ, ED - Abnormal; Notable for the following:    Troponin i, poc 0.22 (*)    All other components within normal limits  COMPREHENSIVE METABOLIC PANEL    Imaging Review No results found.   EKG Interpretation   Date/Time:  Wednesday August 12 2014 22:28:00 EST Ventricular Rate:  102 PR Interval:  175 QRS Duration: 107 QT Interval:  357 QTC Calculation: 465 R Axis:   124 Text Interpretation:  Sinus tachycardia Probable left atrial enlargement  Low  voltage with right axis deviation Abnormal R-wave progression, late  transition nonspecific st changes No significant change since last tracing  Confirmed by Canary Brim  MD, MARTHA [phone]) on 08/12/2014 10:51:49 PM      MDM   Final diagnoses:  None    Patient is a 74 year old male with history of cardiomyopathy with AICD placement. He presents today for evaluation of receiving multiple shocks at home. Interrogation of the device reveals him to be experiencing episodes of monomorphic ventricular tachycardia with appropriate cardioversion. I've spoken with Dr. Claiborne Billings who is recommending admission to Dr. Radford Pax on the cardiology service. He will evaluate the patient in the ER.  CRITICAL CARE Performed by: Veryl Speak Total critical care  time: 30 minutes Critical care time was exclusive of separately billable procedures and treating other patients. Critical care was necessary to treat or prevent imminent or life-threatening deterioration. Critical care was time spent personally by me on the following activities: development of treatment plan with patient and/or surrogate as well as nursing, discussions with consultants, evaluation of patient's response to treatment, examination of patient, obtaining history from patient or surrogate, ordering and performing treatments and interventions, ordering and review of laboratory studies, ordering and review of radiographic studies, pulse oximetry and re-evaluation of patient's condition.   I personally performed the services described in this documentation, which was scribed in my presence. The recorded information has been reviewed and is accurate.      Veryl Speak, MD 08/12/14 [phone]

## 2014-08-12 NOTE — ED Notes (Signed)
Charge RN asked to [employer]. Jude defibrillator.

## 2014-08-12 NOTE — ED Notes (Addendum)
Pt is from home, pt has a [employer] defibrillator that was placed in Nov 2009, pt reports his pacemaker fired 4 times in twenty minutes. Pt instructed to call EMS. Pt reports when it fired the first time he felt dizzy and diaphoretic, pt denies this at this time. Pt denies chest pain/SOB. A&O X4. NSR/ST on the monitor, multifocal PVCs reported by EMS.

## 2014-08-12 NOTE — ED Notes (Signed)
Run of [facility] present on monitor, strip printed out and given to EDP.

## 2014-08-13 ENCOUNTER — Encounter (HOSPITAL_COMMUNITY)
Admission: EM | Disposition: A | Discharge status: Home / Self Care | Payer: Self-pay | Source: Home / Self Care | Attending: Cardiology

## 2014-08-13 ENCOUNTER — Encounter (HOSPITAL_COMMUNITY): Payer: Self-pay | Admitting: *Deleted

## 2014-08-13 DIAGNOSIS — E1122 Type 2 diabetes mellitus with diabetic chronic kidney disease: Secondary | ICD-10-CM

## 2014-08-13 DIAGNOSIS — N189 Chronic kidney disease, unspecified: Secondary | ICD-10-CM

## 2014-08-13 DIAGNOSIS — I472 Ventricular tachycardia, unspecified: Secondary | ICD-10-CM | POA: Insufficient documentation

## 2014-08-13 DIAGNOSIS — I270 Primary pulmonary hypertension: Secondary | ICD-10-CM

## 2014-08-13 DIAGNOSIS — R7989 Other specified abnormal findings of blood chemistry: Secondary | ICD-10-CM

## 2014-08-13 DIAGNOSIS — I5022 Chronic systolic (congestive) heart failure: Secondary | ICD-10-CM

## 2014-08-13 DIAGNOSIS — Z4502 Encounter for adjustment and management of automatic implantable cardiac defibrillator: Secondary | ICD-10-CM | POA: Diagnosis present

## 2014-08-13 DIAGNOSIS — I10 Essential (primary) hypertension: Secondary | ICD-10-CM

## 2014-08-13 HISTORY — PX: LEFT AND RIGHT HEART CATHETERIZATION WITH CORONARY ANGIOGRAM: SHX5449

## 2014-08-13 LAB — CBC
HCT: 32.2 % — ABNORMAL LOW (ref 39.0–52.0)
HCT: 33.9 % — ABNORMAL LOW (ref 39.0–52.0)
HEMATOCRIT: 33.9 % — AB (ref 39.0–52.0)
Hemoglobin: 10.5 g/dL — ABNORMAL LOW (ref 13.0–17.0)
Hemoglobin: 11.1 g/dL — ABNORMAL LOW (ref 13.0–17.0)
Hemoglobin: 11.2 g/dL — ABNORMAL LOW (ref 13.0–17.0)
MCH: 26.5 pg (ref 26.0–34.0)
MCH: 27.2 pg (ref 26.0–34.0)
MCH: 27.5 pg (ref 26.0–34.0)
MCHC: 32.6 g/dL (ref 30.0–36.0)
MCHC: 32.7 g/dL (ref 30.0–36.0)
MCHC: 33 g/dL (ref 30.0–36.0)
MCV: 81.3 fL (ref 78.0–100.0)
MCV: 83.1 fL (ref 78.0–100.0)
MCV: 83.3 fL (ref 78.0–100.0)
PLATELETS: 162 10*3/uL (ref 150–400)
PLATELETS: 138 10*3/uL — AB (ref 150–400)
Platelets: 130 10*3/uL — ABNORMAL LOW (ref 150–400)
RBC: 3.96 MIL/uL — ABNORMAL LOW (ref 4.22–5.81)
RBC: 4.08 MIL/uL — AB (ref 4.22–5.81)
RBC: 4.07 MIL/uL — ABNORMAL LOW (ref 4.22–5.81)
RDW: 15.7 % — AB (ref 11.5–15.5)
RDW: 16.1 % — ABNORMAL HIGH (ref 11.5–15.5)
RDW: 15.9 % — AB (ref 11.5–15.5)
WBC: 4.5 10*3/uL (ref 4.0–10.5)
WBC: 3.9 10*3/uL — AB (ref 4.0–10.5)
WBC: 3.6 10*3/uL — ABNORMAL LOW (ref 4.0–10.5)

## 2014-08-13 LAB — COMPREHENSIVE METABOLIC PANEL
ALT: 40 U/L (ref 0–53)
AST: 37 U/L (ref 0–37)
Albumin: 3.5 g/dL (ref 3.5–5.2)
Alkaline Phosphatase: 47 U/L (ref 39–117)
Anion gap: 9 (ref 5–15)
BUN: 18 mg/dL (ref 6–23)
CALCIUM: 8.7 mg/dL (ref 8.4–10.5)
CHLORIDE: 106 mmol/L (ref 96–112)
CO2: 24 mmol/L (ref 19–32)
Creatinine, Ser: 1.46 mg/dL — ABNORMAL HIGH (ref 0.50–1.35)
GFR calc Af Amer: 54 mL/min — ABNORMAL LOW (ref >90)
GFR calc non Af Amer: 46 mL/min — ABNORMAL LOW (ref >90)
GLUCOSE: 125 mg/dL — AB (ref 70–99)
Potassium: 3.8 mmol/L (ref 3.5–5.1)
Sodium: 139 mmol/L (ref 135–145)
Total Bilirubin: 1.2 mg/dL (ref 0.3–1.2)
Total Protein: 6.7 g/dL (ref 6.0–8.3)

## 2014-08-13 LAB — BASIC METABOLIC PANEL
Anion gap: 11 (ref 5–15)
BUN: 20 mg/dL (ref 6–23)
CO2: 20 mmol/L (ref 19–32)
Calcium: 8.3 mg/dL — ABNORMAL LOW (ref 8.4–10.5)
Chloride: 106 mmol/L (ref 96–112)
Creatinine, Ser: 1.46 mg/dL — ABNORMAL HIGH (ref 0.50–1.35)
GFR calc Af Amer: 54 mL/min — ABNORMAL LOW (ref >90)
GFR, EST NON AFRICAN AMERICAN: 46 mL/min — AB (ref >90)
GLUCOSE: 124 mg/dL — AB (ref 70–99)
POTASSIUM: 3.7 mmol/L (ref 3.5–5.1)
Sodium: 137 mmol/L (ref 135–145)

## 2014-08-13 LAB — CREATININE, SERUM
CREATININE: 1.38 mg/dL — AB (ref 0.50–1.35)
Creatinine, Ser: 1.37 mg/dL — ABNORMAL HIGH (ref 0.50–1.35)
GFR calc Af Amer: 57 mL/min — ABNORMAL LOW (ref >90)
GFR calc Af Amer: 58 mL/min — ABNORMAL LOW (ref >90)
GFR calc non Af Amer: 50 mL/min — ABNORMAL LOW (ref >90)
GFR, EST NON AFRICAN AMERICAN: 50 mL/min — AB (ref >90)

## 2014-08-13 LAB — TROPONIN I
TROPONIN I: 1.71 ng/mL — AB (ref <0.031)
TROPONIN I: 2.42 ng/mL — AB (ref <0.031)
Troponin I: 1.93 ng/mL (ref <0.031)

## 2014-08-13 LAB — LIPID PANEL
Cholesterol: 117 mg/dL (ref 0–200)
HDL: 40 mg/dL (ref >39)
LDL CALC: 67 mg/dL (ref 0–99)
TRIGLYCERIDES: 48 mg/dL (ref <150)
Total CHOL/HDL Ratio: 2.9 RATIO
VLDL: 10 mg/dL (ref 0–40)

## 2014-08-13 LAB — MRSA PCR SCREENING: MRSA BY PCR: NEGATIVE

## 2014-08-13 LAB — POCT I-STAT 3, ART BLOOD GAS (G3+)
ACID-BASE DEFICIT: 4 mmol/L — AB (ref 0.0–2.0)
BICARBONATE: 21.1 meq/L (ref 20.0–24.0)
O2 Saturation: 98 %
PCO2 ART: 37.6 mmHg (ref 35.0–45.0)
PO2 ART: 103 mmHg — AB (ref 80.0–100.0)
TCO2: 22 mmol/L (ref 0–100)
pH, Arterial: 7.358 (ref 7.350–7.450)

## 2014-08-13 LAB — BRAIN NATRIURETIC PEPTIDE: B Natriuretic Peptide: 372 pg/mL — ABNORMAL HIGH (ref 0.0–100.0)

## 2014-08-13 LAB — POCT I-STAT 3, VENOUS BLOOD GAS (G3P V)
Acid-base deficit: 4 mmol/L — ABNORMAL HIGH (ref 0.0–2.0)
Bicarbonate: 21.5 mEq/L (ref 20.0–24.0)
O2 SAT: 58 %
TCO2: 23 mmol/L (ref 0–100)
pCO2, Ven: 40.6 mmHg — ABNORMAL LOW (ref 45.0–50.0)
pH, Ven: 7.331 — ABNORMAL HIGH (ref 7.250–7.300)
pO2, Ven: 32 mmHg (ref 30.0–45.0)

## 2014-08-13 LAB — PROTIME-INR
INR: 1.09 (ref 0.00–1.49)
Prothrombin Time: 14.2 seconds (ref 11.6–15.2)

## 2014-08-13 LAB — GLUCOSE, CAPILLARY
GLUCOSE-CAPILLARY: 124 mg/dL — AB (ref 70–99)
GLUCOSE-CAPILLARY: 142 mg/dL — AB (ref 70–99)
Glucose-Capillary: 119 mg/dL — ABNORMAL HIGH (ref 70–99)

## 2014-08-13 LAB — TSH: TSH: 0.827 u[IU]/mL (ref 0.350–4.500)

## 2014-08-13 LAB — MAGNESIUM: MAGNESIUM: 1.8 mg/dL (ref 1.5–2.5)

## 2014-08-13 SURGERY — LEFT AND RIGHT HEART CATHETERIZATION WITH CORONARY ANGIOGRAM
Anesthesia: LOCAL

## 2014-08-13 MED ORDER — DIGOXIN 125 MCG PO TABS
125.0000 ug | ORAL_TABLET | Freq: Every day | ORAL | Status: DC
  Administered 2014-08-13 – 2014-08-16 (×4): 125 ug via ORAL
  Filled 2014-08-13 (×4): qty 1

## 2014-08-13 MED ORDER — ASPIRIN EC 81 MG PO TBEC
81.0000 mg | DELAYED_RELEASE_TABLET | Freq: Every day | ORAL | Status: DC
  Administered 2014-08-14 – 2014-08-16 (×3): 81 mg via ORAL
  Filled 2014-08-13 (×3): qty 1

## 2014-08-13 MED ORDER — SODIUM CHLORIDE 0.9 % IV SOLN
INTRAVENOUS | Status: AC
  Administered 2014-08-13: 50 mL/h via INTRAVENOUS

## 2014-08-13 MED ORDER — MIDAZOLAM HCL 2 MG/2ML IJ SOLN
INTRAMUSCULAR | Status: AC
  Filled 2014-08-13: qty 2

## 2014-08-13 MED ORDER — SODIUM CHLORIDE 0.9 % IV SOLN
1.0000 mL/kg/h | INTRAVENOUS | Status: DC
  Administered 2014-08-13: 1 mL/kg/h via INTRAVENOUS

## 2014-08-13 MED ORDER — SODIUM CHLORIDE 0.9 % IJ SOLN: 3.0000 mL | INTRAMUSCULAR | Status: DC | PRN

## 2014-08-13 MED ORDER — LIDOCAINE HCL (PF) 1 % IJ SOLN
INTRAMUSCULAR | Status: AC
  Filled 2014-08-13: qty 30

## 2014-08-13 MED ORDER — SIMVASTATIN 10 MG PO TABS
10.0000 mg | ORAL_TABLET | Freq: Every day | ORAL | Status: DC
  Administered 2014-08-13 – 2014-08-15 (×3): 10 mg via ORAL
  Filled 2014-08-13 (×5): qty 1

## 2014-08-13 MED ORDER — ASPIRIN EC 81 MG PO TBEC: 81.0000 mg | DELAYED_RELEASE_TABLET | Freq: Every day | ORAL | Status: DC

## 2014-08-13 MED ORDER — CARVEDILOL 25 MG PO TABS
25.0000 mg | ORAL_TABLET | Freq: Two times a day (BID) | ORAL | Status: DC
  Administered 2014-08-13 – 2014-08-16 (×7): 25 mg via ORAL
  Filled 2014-08-13 (×9): qty 1

## 2014-08-13 MED ORDER — FEBUXOSTAT 40 MG PO TABS
40.0000 mg | ORAL_TABLET | Freq: Every day | ORAL | Status: DC
Start: 2014-08-13 — End: 2014-08-13
  Filled 2014-08-13 (×2): qty 1

## 2014-08-13 MED ORDER — SODIUM CHLORIDE 0.9 % IV SOLN: 250.0000 mL | INTRAVENOUS | Status: DC | PRN

## 2014-08-13 MED ORDER — COLCHICINE 0.6 MG PO TABS
0.6000 mg | ORAL_TABLET | Freq: Two times a day (BID) | ORAL | Status: DC | PRN
  Filled 2014-08-13: qty 1

## 2014-08-13 MED ORDER — OXYCODONE-ACETAMINOPHEN 5-325 MG PO TABS: 1.0000 | ORAL_TABLET | ORAL | Status: DC | PRN

## 2014-08-13 MED ORDER — INSULIN ASPART 100 UNIT/ML ~~LOC~~ SOLN
0.0000 [IU] | Freq: Three times a day (TID) | SUBCUTANEOUS | Status: DC
  Administered 2014-08-14: 2 [IU] via SUBCUTANEOUS
  Administered 2014-08-14 – 2014-08-15 (×3): 1 [IU] via SUBCUTANEOUS
  Administered 2014-08-16: 2 [IU] via SUBCUTANEOUS

## 2014-08-13 MED ORDER — FEBUXOSTAT 40 MG PO TABS
80.0000 mg | ORAL_TABLET | Freq: Every day | ORAL | Status: DC
  Administered 2014-08-13: 80 mg via ORAL
  Filled 2014-08-13: qty 2

## 2014-08-13 MED ORDER — HEPARIN (PORCINE) IN NACL 2-0.9 UNIT/ML-% IJ SOLN
INTRAMUSCULAR | Status: AC
  Filled 2014-08-13: qty 1500

## 2014-08-13 MED ORDER — AMIODARONE HCL IN DEXTROSE 360-4.14 MG/200ML-% IV SOLN
60.0000 mg/h | Freq: Once | INTRAVENOUS | Status: AC
  Administered 2014-08-13: 60 mg/h via INTRAVENOUS
  Filled 2014-08-13: qty 200

## 2014-08-13 MED ORDER — ACETAMINOPHEN 325 MG PO TABS: 650.0000 mg | ORAL_TABLET | ORAL | Status: DC | PRN

## 2014-08-13 MED ORDER — HEPARIN SODIUM (PORCINE) 5000 UNIT/ML IJ SOLN
5000.0000 [IU] | Freq: Three times a day (TID) | INTRAMUSCULAR | Status: DC
  Administered 2014-08-13 – 2014-08-16 (×8): 5000 [IU] via SUBCUTANEOUS
  Filled 2014-08-13 (×13): qty 1

## 2014-08-13 MED ORDER — SODIUM CHLORIDE 0.9 % IJ SOLN
3.0000 mL | Freq: Two times a day (BID) | INTRAMUSCULAR | Status: DC
  Administered 2014-08-13: 3 mL via INTRAVENOUS

## 2014-08-13 MED ORDER — AMIODARONE LOAD VIA INFUSION
150.0000 mg | Freq: Once | INTRAVENOUS | Status: AC
  Administered 2014-08-13: 150 mg via INTRAVENOUS
  Filled 2014-08-13: qty 83.34

## 2014-08-13 MED ORDER — LISINOPRIL 5 MG PO TABS
5.0000 mg | ORAL_TABLET | Freq: Every day | ORAL | Status: DC
  Filled 2014-08-13: qty 1

## 2014-08-13 MED ORDER — ASPIRIN 81 MG PO CHEW
81.0000 mg | CHEWABLE_TABLET | Freq: Once | ORAL | Status: AC
  Administered 2014-08-13: 81 mg via ORAL
  Filled 2014-08-13: qty 1

## 2014-08-13 MED ORDER — ONDANSETRON HCL 4 MG/2ML IJ SOLN: 4.0000 mg | Freq: Four times a day (QID) | INTRAMUSCULAR | Status: DC | PRN

## 2014-08-13 MED ORDER — FENTANYL CITRATE 0.05 MG/ML IJ SOLN
INTRAMUSCULAR | Status: AC
  Filled 2014-08-13: qty 2

## 2014-08-13 MED ORDER — NITROGLYCERIN 0.4 MG SL SUBL: 0.4000 mg | SUBLINGUAL_TABLET | SUBLINGUAL | Status: DC | PRN

## 2014-08-13 MED ORDER — INSULIN ASPART 100 UNIT/ML ~~LOC~~ SOLN: 0.0000 [IU] | Freq: Every day | SUBCUTANEOUS | Status: DC

## 2014-08-13 MED ORDER — FEBUXOSTAT 80 MG PO TABS: 40.0000 mg | ORAL_TABLET | Freq: Two times a day (BID) | ORAL | Status: DC

## 2014-08-13 MED ORDER — LINACLOTIDE 145 MCG PO CAPS
145.0000 ug | ORAL_CAPSULE | Freq: Every day | ORAL | Status: DC
  Administered 2014-08-14 – 2014-08-16 (×2): 145 ug via ORAL
  Filled 2014-08-13 (×4): qty 1

## 2014-08-13 MED ORDER — AMIODARONE IV BOLUS ONLY 150 MG/100ML: 150.0000 mg | Freq: Once | INTRAVENOUS | Status: DC

## 2014-08-13 MED ORDER — AMIODARONE HCL IN DEXTROSE 360-4.14 MG/200ML-% IV SOLN
30.0000 mg/h | INTRAVENOUS | Status: DC
  Administered 2014-08-13: 60 mg/h via INTRAVENOUS
  Administered 2014-08-13 – 2014-08-14 (×3): 30 mg/h via INTRAVENOUS
  Filled 2014-08-13 (×9): qty 200

## 2014-08-13 NOTE — Progress Notes (Signed)
Progress Note  Subjective:    No complaints this AM. Says he is feeling well, no chest pain or SOB. Denies dizziness, lightheadedness or palpitations.  Says his left arm is somewhat "tight".   Objective:   Temp:  [98 F (36.7 C)-98.7 F (37.1 C)] 98.7 F (37.1 C) (02/25 0800) Pulse Rate:  [72-101] 82 (02/25 0800) Resp:  [18-34] 26 (02/25 0800) BP: (88-120)/(56-74) 97/65 mmHg (02/25 0800) SpO2:  [94 %-99 %] 97 % (02/25 0800) Weight:  [205 lb (92.987 kg)-208 lb 15.9 oz (94.8 kg)] 208 lb 15.9 oz (94.8 kg) (02/25 0500) Last BM Date: 08/12/14  Filed Weights   08/12/14 2232 08/13/14 0050 08/13/14 0500  Weight: 205 lb (92.987 kg) 208 lb 8.9 oz (94.6 kg) 208 lb 15.9 oz (94.8 kg)    Intake/Output Summary (Last 24 hours) at 08/13/14 0841 Last data filed at 08/13/14 0630  Gross per 24 hour  Intake  116.6 ml  Output    150 ml  Net  -33.4 ml    Telemetry: NSR, PVC's. One episode of 8 beats NSVT ~1:30 AM  Physical Exam: General: AA male, alert, cooperative, NAD. HEENT: PERRL, EOMI. Moist mucus membranes Neck: Full range of motion without pain, supple, no lymphadenopathy or carotid bruits Lungs: Clear to ascultation bilaterally, normal work of respiration, no wheezes, rales, rhonchi Heart: RRR, no gallops, or rubs. 2/6 systolic murmur.  Abdomen: Soft, non-tender, non-distended, BS + Extremities: No cyanosis, clubbing, or edema. Left foot deformity ("shrapnel from [place]") Neurologic: Alert & oriented X3, cranial nerves II-XII intact, strength grossly intact, sensation intact to light touch   Lab Results:  Basic Metabolic Panel:  Recent Labs Lab 08/12/14 2230 08/13/14 0111  NA 139  --   K 3.8  --   CL 106  --   CO2 24  --   GLUCOSE 125*  --   BUN 18  --   CREATININE 1.46* 1.38*  CALCIUM 8.7  --   MG  --  1.8    Liver Function Tests:  Recent Labs Lab 08/12/14 2230  AST 37  ALT 40  ALKPHOS 47  BILITOT 1.2  PROT 6.7  ALBUMIN 3.5    CBC:  Recent  Labs Lab 08/12/14 2230 08/13/14 0253 08/13/14 0800  WBC 5.8 4.5 3.9*  HGB 11.2* 10.5* 11.1*  HCT 34.4* 32.2* 33.9*  MCV 81.5 81.3 83.1  PLT 156 162 138*    Cardiac Enzymes:  Recent Labs Lab 08/13/14 0111  TROPONINI 1.71*    ECG: Sinus tachycardia, LAD   Medications:   Scheduled Medications: . [START ON 08/14/2014] aspirin EC  81 mg Oral Daily  . carvedilol  25 mg Oral BID WC  . digoxin  125 mcg Oral Daily  . febuxostat  80 mg Oral Daily   And  . febuxostat  40 mg Oral QHS  . heparin  5,000 Units Subcutaneous 3 times per day  . Linaclotide  145 mcg Oral Daily  . lisinopril  5 mg Oral Daily  . simvastatin  10 mg Oral QHS     Infusions: . amiodarone 30 mg/hr (08/13/14 0630)     PRN Medications:  acetaminophen, colchicine, nitroGLYCERIN, ondansetron (ZOFRAN) IV   Assessment and Plan:  74 y/o M w/ PMHx of DM type II, HTN, chronic systolic CHF w/ AICD placed in 2008, admitted w/ palpitations and AICD firing.   Monomorphic Ventricular Tachycardia: Patient w/ 7 shocks total from ICD. Interrogated, found to be monomorphic VT. Started on Amiodarone gtt in  ED. Some question of ischemia as etiology. Previous nuclear study from 2008 showed no ischemia, cath in 1990's showed normal coronaries. Patient does have h/o tobacco abuse, HLD, and HTN. TSH wnl. Troponin 1.71 on admission, repeat pending.  -Continue Amiodarone gtt for now; transition to po tomorrow -For cardiac catheterization today -Digoxin 0.125 mg qd -Hold Lisinopril + Coreg 25 mg bid -Zocor 10 mg qhs -ASA -Zofran, NTG prn  Chronic Systolic CHF: Most recent ECHO from 04/2014 shows EF 20-25%, diffuse hypokinesis.  -Hold Lasix, Coreg, ACEI as above  CKD Stage 3: Baseline Cr ~1.5. Trend as follows:   Recent Labs Lab 08/12/14 2230 08/13/14 0111  CREATININE 1.46* 1.38*  -BMP in AM  DM type II: Takes Tradjenta at home -ISS-S  HLD: Stable -Statin as above   Gary Bence, [name] PGY-2 Internal  Medicine Pager: [phone] Patient seen and examined and history reviewed. Agree with above findings and plan. Very pleasant 74 yo male with history of nonischemic cardiomyopathy admitted with recurrent VT with multiple PCI shocks. Troponin is mildy elevated- probably due to ICD shocks. No recent symptoms of angina. No CHF symptoms. Able to do housework, yard work, wash car without dyspnea. EF 20% by Echo in November. He needs ischemic work up. Will plan right and left heart cath today. (no LV gram with CKD). Will hydrate this am and hold lasix and ACEi for cath. Spoke to Dr. Rayann Heman who will see in am for EP input. Continue IV amiodarone.   [name], [name] 08/13/2014 9:24 AM

## 2014-08-13 NOTE — Interval H&P Note (Signed)
Cath Lab Visit (complete for each Cath Lab visit)  Clinical Evaluation Leading to the Procedure:   ACS: Yes.    Non-ACS:    Anginal Classification: CCS III  Anti-ischemic medical therapy: Maximal Therapy (2 or more classes of medications)  Non-Invasive Test Results: No non-invasive testing performed  Prior CABG: No previous CABG      History and Physical Interval Note:  08/13/2014 2:08 PM  [name]  has presented today for surgery, with the diagnosis of chf  The various methods of treatment have been discussed with the patient and family. After consideration of risks, benefits and other options for treatment, the patient has consented to  Procedure(s): LEFT AND RIGHT HEART CATHETERIZATION WITH CORONARY ANGIOGRAM (N/A) as a surgical intervention .  The patient's history has been reviewed, patient examined, no change in status, stable for surgery.  I have reviewed the patient's chart and labs.  Questions were answered to the patient's satisfaction.     Sinclair Grooms

## 2014-08-13 NOTE — H&P (Addendum)
[name] is an 74 y.o. male.    Chief Complaint: palpitations and ICD shocks Primary Cardiologist/EP: Crenshaw/Allred PCP: Dr. 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 He was last seen in clinic 04/09/14 with Dr. Stanford Breed. He doesn't do a lot of physical activity but performs basic ADLs, can travel to grocery store/gas station and ambulate solo. He started feeling like he had a cold 2-3 days ago but no fever/chills/nausea/vomiting/diarrhea. Today he felt his defibrillator go over 3-4x in 20 minutes leading to 911 call. He had some associated dizziness/diaphoresis. Otherwise, nothing else new going on. No syncope. No previous ICD shocks. No chest pain. He is accompanied by his wife and son. In the ER ICD interrogated and per report 7 shocks with 1 episode requiring 3 shocks for success. Monomorphic VT at 230s. He was started on IV amiodarone bolus + gtt in the ER. He had one more episode of VT in the ER.      Past Medical History  Diagnosis Date  . AICD (automatic cardioverter/defibrillator) present   . Obesity   . Prostate cancer   . Glucose intolerance (impaired glucose tolerance)   . Bursitis of elbow     left  . Osteoarthritis   . HTN (hypertension)   . HLD (hyperlipidemia)   . History of DVT (deep vein thrombosis)   . Chronic systolic heart failure     echo 4/13: EF 25-30%, mod MR, mod LAE, PASP 47  . NICM (nonischemic cardiomyopathy)     normal cors by cath in 1990s;  nuclear 8/08: no ischemia; inf/lat/dist ant/apical scar  . Moderate mitral regurgitation     echo 4/13    Past Surgical History  Procedure Laterality Date  . Transurethral resection of prostate    . Cardiac defibrillator placement  10/09  . Hernia repair    . Robot assisted laparoscopic radical prostatectomy  6/08    Family  History  Problem Relation Age of Onset  . Cancer Father     prostate  . Heart disease Mother    Social History:  reports that he quit smoking about 38 years ago. His smoking use included Cigarettes. He has a 5 pack-year smoking history. He has never used smokeless tobacco. He reports that he does not drink alcohol or use illicit drugs.  Allergies:  Allergies  Allergen Reactions  . Spironolactone Other (See Comments)    Cannot remember reaction     (Not in a hospital admission)  Results for orders placed or performed during the hospital encounter of 08/12/14 (from the past 48 hour(s))  CBC     Status: Abnormal   Collection Time: 08/12/14 10:30 PM  Result Value Ref Range   WBC 5.8 4.0 - 10.5 K/uL   RBC 4.22 4.22 - 5.81 MIL/uL   Hemoglobin 11.2 (L) 13.0 - 17.0 g/dL   HCT 34.4 (L) 39.0 - 52.0 %   MCV 81.5 78.0 - 100.0 fL   MCH 26.5 26.0 - 34.0 pg   MCHC 32.6 30.0 - 36.0 g/dL   RDW 15.8 (H) 11.5 - 15.5 %   Platelets 156 150 - 400 K/uL  Comprehensive metabolic panel     Status: Abnormal   Collection Time: 08/12/14 10:30 PM  Result Value Ref Range   Sodium 139 135 - 145 mmol/L   Potassium  3.8 3.5 - 5.1 mmol/L   Chloride 106 96 - 112 mmol/L   CO2 24 19 - 32 mmol/L   Glucose, Bld 125 (H) 70 - 99 mg/dL   BUN 18 6 - 23 mg/dL   Creatinine, Ser 1.46 (H) 0.50 - 1.35 mg/dL   Calcium 8.7 8.4 - 10.5 mg/dL   Total Protein 6.7 6.0 - 8.3 g/dL   Albumin 3.5 3.5 - 5.2 g/dL   AST 37 0 - 37 U/L   ALT 40 0 - 53 U/L   Alkaline Phosphatase 47 39 - 117 U/L   Total Bilirubin 1.2 0.3 - 1.2 mg/dL   GFR calc non Af Amer 46 (L) >90 mL/min   GFR calc Af Amer 54 (L) >90 mL/min    Comment: (NOTE) The eGFR has been calculated using the CKD EPI equation. This calculation has not been validated in all clinical situations. eGFR's persistently <90 mL/min signify possible Chronic Kidney Disease.    Anion gap 9 5 - 15  I-stat troponin, ED (not at [facility])     Status: Abnormal   Collection Time: 08/12/14  10:42 PM  Result Value Ref Range   Troponin i, poc 0.22 (HH) 0.00 - 0.08 ng/mL   Comment NOTIFIED PHYSICIAN    Comment 3            Comment: Due to the release kinetics of cTnI, a negative result within the first hours of the onset of symptoms does not rule out myocardial infarction with certainty. If myocardial infarction is still suspected, repeat the test at appropriate intervals.    No results found.  Review of Systems  Constitutional: Positive for malaise/fatigue. Negative for fever and chills.  HENT: Negative for ear discharge.   Eyes: Negative for double vision and photophobia.  Respiratory: Negative for cough, hemoptysis, sputum production and shortness of breath.   Cardiovascular: Negative for chest pain, claudication and leg swelling.  Gastrointestinal: Negative for nausea, vomiting and abdominal pain.  Genitourinary: Negative for dysuria, urgency and frequency.  Musculoskeletal: Negative for myalgias and neck pain.  Skin: Negative for rash.  Neurological: Positive for dizziness. Negative for tingling, tremors, sensory change and headaches.  Endo/Heme/Allergies: Negative for polydipsia. Does not bruise/bleed easily.  Psychiatric/Behavioral: Negative for depression, suicidal ideas and substance abuse.    Blood pressure 117/67, pulse 97, temperature 98.4 F (36.9 C), temperature source Oral, resp. rate 26, height 5' 9"  (1.753 m), weight 92.987 kg (205 lb), SpO2 94 %. Physical Exam  Nursing note and vitals reviewed. Constitutional: He is oriented to person, place, and time. He appears well-developed and well-nourished. No distress.  HENT:  Head: Normocephalic and atraumatic.  Nose: Nose normal.  Mouth/Throat: Oropharynx is clear and moist. No oropharyngeal exudate.  Eyes: Conjunctivae and EOM are normal. Pupils are equal, round, and reactive to light. No scleral icterus.  Neck: Neck supple. No JVD present. No tracheal deviation present.  JVP 1 cm above the clavicle    Cardiovascular: Normal rate, regular rhythm, normal heart sounds and intact distal pulses.  Exam reveals no gallop.   No murmur heard. Respiratory: Effort normal and breath sounds normal. No respiratory distress. He has no wheezes. He has no rales.  GI: Soft. Bowel sounds are normal. He exhibits no distension. There is no tenderness. There is no rebound.  Musculoskeletal: Normal range of motion. He exhibits no edema or tenderness.  Neurological: He is alert and oriented to person, place, and time. No cranial nerve deficit. Coordination normal.  Skin: Skin is warm  and dry. No rash noted. He is not diaphoretic. No erythema.  Psychiatric: He has a normal mood and affect. His behavior is normal. Judgment and thought content normal.  ECG: ST, HR 102, poor r-wave progression, LAE, RAD/LPFB Trop 0.22, bun/cr 18/1.46, na 139, K 3.8 Normal LHC 90s (clean coronaries); 8/08 nuclear with inferior/lateral/anterior/apical scar Echo 11/15: EF 20-25%, dilated LV, diffuse hypokinesis  Assessment/Plan Problem List ICD firing with Monomorphic VT Chronic Systolic HF Mildly elevated troponin T2DM Hypertension Dyslipiemia  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 Differential diagnosis includes ischemia, electrolytes, scar, outflow tract VT. Given prior clean coronaries and known long-standing dilated cardiomyopathy, I favor scar mediated VT. However, he is a previous tobacco user with male gender, hypertension, dyslipidemia so ischemic is also possible. Will watch on telemetry, treat with amiodarone for now, consider ischemic evaluation based on troponins and clinical picture development. If no ischemic found then drug trial can be considered and if refractory then could consider VT ablation.  - trend  cardiac biomarkers - NPO after MN, consider ischemic evaluation  - amiodarone 300 mg IV, 24 hours of 1 mg/min, then transition to PO - continue coreg 25 mg bid and lisinopril 5 mg daily with bp holding parmaters   Shaydon Lease 08/13/2014, 12:39 AM

## 2014-08-13 NOTE — ED Notes (Signed)
Cardiology at [facility]

## 2014-08-13 NOTE — Progress Notes (Signed)
CRITICAL VALUE ALERT  Critical value received:  Trop 1.71  Date of notification:   08/12/2014  Time of notification:  0220  Critical value read back:Yes.    Nurse who received alert:  A. Charlesetta Garibaldi, RN  MD notified (1st page): Jamse Mead  Time of first page: 0222  MD notified (2nd page):  Time of second page:  Responding MD: Jamse Mead  Time MD responded: [phone]

## 2014-08-13 NOTE — H&P (View-Only) (Signed)
Progress Note  Subjective:    No complaints this AM. Says he is feeling well, no chest pain or SOB. Denies dizziness, lightheadedness or palpitations.  Says his left arm is somewhat "tight".   Objective:   Temp:  [98 F (36.7 C)-98.7 F (37.1 C)] 98.7 F (37.1 C) (02/25 0800) Pulse Rate:  [72-101] 82 (02/25 0800) Resp:  [18-34] 26 (02/25 0800) BP: (88-120)/(56-74) 97/65 mmHg (02/25 0800) SpO2:  [94 %-99 %] 97 % (02/25 0800) Weight:  [205 lb (92.987 kg)-208 lb 15.9 oz (94.8 kg)] 208 lb 15.9 oz (94.8 kg) (02/25 0500) Last BM Date: 08/12/14  Filed Weights   08/12/14 2232 08/13/14 0050 08/13/14 0500  Weight: 205 lb (92.987 kg) 208 lb 8.9 oz (94.6 kg) 208 lb 15.9 oz (94.8 kg)    Intake/Output Summary (Last 24 hours) at 08/13/14 0841 Last data filed at 08/13/14 0630  Gross per 24 hour  Intake  116.6 ml  Output    150 ml  Net  -33.4 ml    Telemetry: NSR, PVC's. One episode of 8 beats NSVT ~1:30 AM  Physical Exam: General: AA male, alert, cooperative, NAD. HEENT: PERRL, EOMI. Moist mucus membranes Neck: Full range of motion without pain, supple, no lymphadenopathy or carotid bruits Lungs: Clear to ascultation bilaterally, normal work of respiration, no wheezes, rales, rhonchi Heart: RRR, no gallops, or rubs. 2/6 systolic murmur.  Abdomen: Soft, non-tender, non-distended, BS + Extremities: No cyanosis, clubbing, or edema. Left foot deformity ("shrapnel from [place]") Neurologic: Alert & oriented X3, cranial nerves II-XII intact, strength grossly intact, sensation intact to light touch   Lab Results:  Basic Metabolic Panel:  Recent Labs Lab 08/12/14 2230 08/13/14 0111  NA 139  --   K 3.8  --   CL 106  --   CO2 24  --   GLUCOSE 125*  --   BUN 18  --   CREATININE 1.46* 1.38*  CALCIUM 8.7  --   MG  --  1.8    Liver Function Tests:  Recent Labs Lab 08/12/14 2230  AST 37  ALT 40  ALKPHOS 47  BILITOT 1.2  PROT 6.7  ALBUMIN 3.5    CBC:  Recent  Labs Lab 08/12/14 2230 08/13/14 0253 08/13/14 0800  WBC 5.8 4.5 3.9*  HGB 11.2* 10.5* 11.1*  HCT 34.4* 32.2* 33.9*  MCV 81.5 81.3 83.1  PLT 156 162 138*    Cardiac Enzymes:  Recent Labs Lab 08/13/14 0111  TROPONINI 1.71*    ECG: Sinus tachycardia, LAD   Medications:   Scheduled Medications: . [START ON 08/14/2014] aspirin EC  81 mg Oral Daily  . carvedilol  25 mg Oral BID WC  . digoxin  125 mcg Oral Daily  . febuxostat  80 mg Oral Daily   And  . febuxostat  40 mg Oral QHS  . heparin  5,000 Units Subcutaneous 3 times per day  . Linaclotide  145 mcg Oral Daily  . lisinopril  5 mg Oral Daily  . simvastatin  10 mg Oral QHS     Infusions: . amiodarone 30 mg/hr (08/13/14 0630)     PRN Medications:  acetaminophen, colchicine, nitroGLYCERIN, ondansetron (ZOFRAN) IV   Assessment and Plan:  74 y/o M w/ PMHx of DM type II, HTN, chronic systolic CHF w/ AICD placed in 2008, admitted w/ palpitations and AICD firing.   Monomorphic Ventricular Tachycardia: Patient w/ 7 shocks total from ICD. Interrogated, found to be monomorphic VT. Started on Amiodarone gtt in  ED. Some question of ischemia as etiology. Previous nuclear study from 2008 showed no ischemia, cath in 1990's showed normal coronaries. Patient does have h/o tobacco abuse, HLD, and HTN. TSH wnl. Troponin 1.71 on admission, repeat pending.  -Continue Amiodarone gtt for now; transition to po tomorrow -For cardiac catheterization today -Digoxin 0.125 mg qd -Hold Lisinopril + Coreg 25 mg bid -Zocor 10 mg qhs -ASA -Zofran, NTG prn  Chronic Systolic CHF: Most recent ECHO from 04/2014 shows EF 20-25%, diffuse hypokinesis.  -Hold Lasix, Coreg, ACEI as above  CKD Stage 3: Baseline Cr ~1.5. Trend as follows:   Recent Labs Lab 08/12/14 2230 08/13/14 0111  CREATININE 1.46* 1.38*  -BMP in AM  DM type II: Takes Tradjenta at home -ISS-S  HLD: Stable -Statin as above   Natasha Bence, MD PGY-2 Internal  Medicine Pager: [phone] Patient seen and examined and history reviewed. Agree with above findings and plan. Very pleasant 74 yo male with history of nonischemic cardiomyopathy admitted with recurrent VT with multiple PCI shocks. Troponin is mildy elevated- probably due to ICD shocks. No recent symptoms of angina. No CHF symptoms. Able to do housework, yard work, wash car without dyspnea. EF 20% by Echo in November. He needs ischemic work up. Will plan right and left heart cath today. (no LV gram with CKD). Will hydrate this am and hold lasix and ACEi for cath. Spoke to Dr. Rayann Heman who will see in am for EP input. Continue IV amiodarone.   Peter Martinique, [place] 08/13/2014 9:24 AM

## 2014-08-13 NOTE — CV Procedure (Signed)
     Left and Right Heart Catheterization with Coronary Angiography Report  [name]  74 y.o.  male July 14, 1940  Procedure Date: 08/13/2014 Referring Physician: Peter Martinique, MD Primary Cardiologist: Kirk Ruths, MD  INDICATIONS: Multiple AICD discharges, elevated troponin, with test requested to document coronary patency and pulmonary artery pressures.  PROCEDURE: 1. Left heart catheterization; 2. Right heart catheterization; 3. Coronary angiography; 4. Left ventricular hemodynamic recordings  CONSENT:  The risks, benefits, and details of the procedure were explained in detail to the patient. Risks including death, stroke, heart attack, kidney injury, allergy, limb ischemia, bleeding and radiation injury were discussed.  The patient verbalized understanding and wanted to proceed.  Informed written consent was obtained.  PROCEDURE TECHNIQUE:  After Xylocaine anesthesia a 5 French sheath was placed in the right femoral artery using the modified Seldinger technique.  A 7 French sheath was placed in the right femoral vein using the Seldinger technique. Coronary angiography was done using a 5 F A2 multipurpose catheter.  Left ventriculography was done using the A2 multipurpose catheter and hand injection. Right heart catheterization was performed using a 7 French balloon-tip Swan-Ganz catheter.  Digital images were reviewed.  Arterial hemostasis was achieved with a Vascade vascular closure device with good hemostasis. Venous hemostasis was achieved with manual compression.   CONTRAST:  Total of 55 cc.  COMPLICATIONS:  None   HEMODYNAMICS:  Aortic pressure 74/46 mmHg; LV pressure 83/7 mmHg; LVEDP 19 mmHg; RA 8 mmHg; RV 51 over 12 mmHg; PA 51 over 14 mmHg; PCWP(mean) 24 mmHg; Cardiac Output by Fick 4.65 L/m and thermodilution 3.97 L/m ; AV gradient 7 mmHg  ANGIOGRAPHIC DATA:   The left main coronary artery is normal..  The left anterior descending artery is very large and normal and  gives origin to 3 diagonal branches.  The first branch has a course of the ramus intermedius. No obstructions are noted in the LAD or its branches.   The left circumflex artery is small, giving origin to one obtuse marginal branch, and is normal..  The right coronary artery is very small but codominant. The posterior interventricular groove is supplied predominantly by the wraparound LAD.   LEFT VENTRICULOGRAM:  Left ventricular angiogram was not done.   IMPRESSIONS:  1. Normal coronary arteries 2. Moderate to severe pulmonary hypertension. 3. Systemic hypotension. 4. Known severe left ventricular systolic dysfunction and documentation of mildly elevated pulmonary wedge/LV filling pressures.   RECOMMENDATION:  Per treating team. We'll give 6 hours of cautious hydration.Marland Kitchen

## 2014-08-13 NOTE — Progress Notes (Signed)
Utilization review completed. Alezandra Egli, RN, BSN. 

## 2014-08-14 ENCOUNTER — Encounter (HOSPITAL_COMMUNITY): Payer: Self-pay | Admitting: Interventional Cardiology

## 2014-08-14 DIAGNOSIS — Z0389 Encounter for observation for other suspected diseases and conditions ruled out: Secondary | ICD-10-CM

## 2014-08-14 LAB — BASIC METABOLIC PANEL
ANION GAP: 7 (ref 5–15)
BUN: 18 mg/dL (ref 6–23)
CHLORIDE: 107 mmol/L (ref 96–112)
CO2: 22 mmol/L (ref 19–32)
Calcium: 8.5 mg/dL (ref 8.4–10.5)
Creatinine, Ser: 1.41 mg/dL — ABNORMAL HIGH (ref 0.50–1.35)
GFR calc Af Amer: 56 mL/min — ABNORMAL LOW (ref >90)
GFR, EST NON AFRICAN AMERICAN: 48 mL/min — AB (ref >90)
GLUCOSE: 152 mg/dL — AB (ref 70–99)
Potassium: 3.9 mmol/L (ref 3.5–5.1)
Sodium: 136 mmol/L (ref 135–145)

## 2014-08-14 LAB — GLUCOSE, CAPILLARY
Glucose-Capillary: 126 mg/dL — ABNORMAL HIGH (ref 70–99)
Glucose-Capillary: 151 mg/dL — ABNORMAL HIGH (ref 70–99)
Glucose-Capillary: 120 mg/dL — ABNORMAL HIGH (ref 70–99)
Glucose-Capillary: 156 mg/dL — ABNORMAL HIGH (ref 70–99)

## 2014-08-14 LAB — CBC
HCT: 35.1 % — ABNORMAL LOW (ref 39.0–52.0)
HEMOGLOBIN: 11.5 g/dL — AB (ref 13.0–17.0)
MCH: 27.2 pg (ref 26.0–34.0)
MCHC: 32.8 g/dL (ref 30.0–36.0)
MCV: 83 fL (ref 78.0–100.0)
PLATELETS: 134 10*3/uL — AB (ref 150–400)
RBC: 4.23 MIL/uL (ref 4.22–5.81)
RDW: 16.1 % — AB (ref 11.5–15.5)
WBC: 5.6 10*3/uL (ref 4.0–10.5)

## 2014-08-14 MED ORDER — AMIODARONE HCL 200 MG PO TABS
400.0000 mg | ORAL_TABLET | Freq: Two times a day (BID) | ORAL | Status: DC
  Administered 2014-08-14 – 2014-08-16 (×4): 400 mg via ORAL
  Filled 2014-08-14 (×6): qty 2

## 2014-08-14 MED ORDER — LISINOPRIL 5 MG PO TABS
5.0000 mg | ORAL_TABLET | Freq: Every day | ORAL | Status: DC
  Administered 2014-08-15 – 2014-08-16 (×2): 5 mg via ORAL
  Filled 2014-08-14 (×3): qty 1

## 2014-08-14 MED ORDER — FUROSEMIDE 40 MG PO TABS
40.0000 mg | ORAL_TABLET | Freq: Every day | ORAL | Status: DC
  Administered 2014-08-15 – 2014-08-16 (×2): 40 mg via ORAL
  Filled 2014-08-14 (×2): qty 1

## 2014-08-14 NOTE — Consult Note (Signed)
ELECTROPHYSIOLOGY CONSULT NOTE    Patient ID: [name] MRN: [id number], DOB/AGE: February 08, 1941 74 y.o.  Admit date: 08/12/2014 Date of Consult: 08/14/2014  Primary Physician: Gary Calico, MD Primary Cardiologist: [name] Electrophysiologist: [name] Referring Physician: [place]  Reason for Consultation: VT  HPI:  [name] is a 74 y.o. male with a past medical history significant for diabetes, hyperlipidemia, hypertension, nonischemic cardiomyopathy (s/p STJ single chamber ICD). For the 2 days prior to admission, he developed cold like symptoms with fevers and chills.  On the day of admission, he had multiple ICD shocks and called 911.  He was brought to [facility] for further evaluation.  He has been placed on IV amiodarone with no further ventricular arrhythmias.  Catheterization yesterday demonstrated normal coronaries, moderate to severe pulmonary hypertension.    Last echo 04/2014 demonstrated EF 20-25%, diffuse hypokinesis, moderate MR, PA pressure 53.   Lab work is notable for troponin peak of 2.42, creat 1.41.   He has not had previous ICD shocks.  He reports compliance with medications.  Device interrogation demonstrates normal device function with 5 episodes of VT in the VF zone (cycle length 29msec) all between 9:24 and 9:35PM on 08-12-14.  1 episode required 3 shocks to convert, others converted then with recurrent VT.    He currently denies chest pain, shortness of breath, LE edema, palpitations, dizziness, syncope.  ROS is otherwise negative except as outlined above.   EP has been asked to evaluate for treatment options.   Past Medical History  Diagnosis Date  . Obesity   . Prostate cancer   . Glucose intolerance (impaired glucose tolerance)   . Bursitis of elbow     left  . Osteoarthritis   . HTN (hypertension)   . HLD (hyperlipidemia)   . History of DVT (deep vein thrombosis)   . Chronic systolic heart failure     echo 4/13: EF 25-30%, mod MR, mod LAE, PASP 47    . NICM (nonischemic cardiomyopathy)     normal cors by cath in 1990s;  nuclear 8/08: no ischemia; inf/lat/dist ant/apical scar  . Moderate mitral regurgitation     echo 4/13  . Ventricular tachycardia      Surgical History:  Past Surgical History  Procedure Laterality Date  . Transurethral resection of prostate    . Cardiac defibrillator placement  10/09    STJ single chamber ICD   . Hernia repair    . Robot assisted laparoscopic radical prostatectomy  6/08  . Left and right heart catheterization with coronary angiogram N/A 08/13/2014    Procedure: LEFT AND RIGHT HEART CATHETERIZATION WITH CORONARY ANGIOGRAM;  Surgeon: Gary Grooms, MD;  Location: [facility] CATH LAB;  Service: Cardiovascular;  Laterality: N/A;     Prescriptions prior to admission  Medication Sig Dispense Refill Last Dose  . aspirin 81 MG tablet Take 81 mg by mouth daily.     08/12/2014 at Unknown time  . carvedilol (COREG) 25 MG tablet Take 25 mg by mouth 2 (two) times daily with a meal.     08/12/2014 at 2100  . digoxin (LANOXIN) 0.125 MG tablet Take 125 mcg by mouth daily.     08/12/2014 at Unknown time  . Febuxostat 80 MG TABS Take 40-80 mg by mouth 2 (two) times daily. Take 1 tablet (80mg ) in the morning and take 1/2 tablet (40mg ) in the evening   08/12/2014 at Unknown time  . furosemide (LASIX) 40 MG tablet Take 40 mg by mouth daily.  Take daily as directed   08/12/2014 at Unknown time  . Linaclotide (LINZESS) 145 MCG CAPS capsule Take 1 capsule (145 mcg total) by mouth daily. 30 capsule 11 08/12/2014 at Unknown time  . lisinopril (PRINIVIL,ZESTRIL) 5 MG tablet Take 5 mg by mouth daily.   08/12/2014 at Unknown time  . Methotrexate Sodium, PF, 50 MG/2ML SOLN Inject 0.8 mLs as directed once a week.   08/05/2014  . simvastatin (ZOCOR) 20 MG tablet Take 10 mg by mouth at bedtime.     08/12/2014 at Unknown time  . TRADJENTA 5 MG TABS tablet TAKE 1 TABLET BY MOUTH DAILY FOR DIABETES 90 tablet 2 08/12/2014 at Unknown time  . COLCRYS  0.6 MG tablet Take 0.6 mg by mouth 2 (two) times daily as needed (for gout symptoms).    unknown  . lisinopril (PRINIVIL,ZESTRIL) 5 MG tablet Take 1 tablet (5 mg total) by mouth daily. 30 tablet 11 Taking  . ULORIC 80 MG TABS TAKE 1 TABLET (80 MG TOTAL) BY MOUTH DAILY. 90 tablet 2  at Unknown time    Inpatient Medications:  . aspirin EC  81 mg Oral Daily  . carvedilol  25 mg Oral BID WC  . digoxin  125 mcg Oral Daily  . heparin  5,000 Units Subcutaneous 3 times per day  . insulin aspart  0-5 Units Subcutaneous QHS  . insulin aspart  0-9 Units Subcutaneous TID WC  . Linaclotide  145 mcg Oral Daily  . simvastatin  10 mg Oral QHS   . amiodarone 30 mg/hr (08/14/14 1221)     Allergies:  Allergies  Allergen Reactions  . Spironolactone Other (See Comments)    Cannot remember reaction    History   Social History  . Marital Status: Married    Spouse Name: N/A  . Number of Children: N/A  . Years of Education: N/A   Occupational History  . Tour manager    Social History Main Topics  . Smoking status: Former Smoker -- 0.50 packs/day for 10 years    Types: Cigarettes    Quit date: 11/01/1975  . Smokeless tobacco: Never Used     Comment: quit over 30 years ago   . Alcohol Use: No     Comment: occasionally  . Drug Use: No  . Sexual Activity: Not Currently   Other Topics Concern  . Not on file   Social History Narrative     Family History  Problem Relation Age of Onset  . Cancer Father     prostate  . Heart disease Mother     Physical Exam: Filed Vitals:   08/14/14 1043 08/14/14 1100 08/14/14 1200 08/14/14 1248  BP:  120/67 84/49   Pulse: 93 89 93   Temp:    97.4 F (36.3 C)  TempSrc:    Oral  Resp:  27 22   Height:      Weight:      SpO2:  98% 99%     GEN- The patient is well appearing, alert and oriented x 3 today.   Head- normocephalic, atraumatic Eyes-  Sclera clear, conjunctiva pink Ears- hearing intact Oropharynx- clear Neck- supple Lungs- Clear  to ausculation bilaterally, normal work of breathing Heart- Regular rate and rhythm, 2/6 SEM GI- obese, soft, NT, ND, + BS Extremities- no clubbing, cyanosis, or edema MS- no significant deformity or atrophy Skin- no rash or lesion Psych- euthymic mood, full affect Neuro- strength and sensation are intact  Labs:   Lab Results  Component Value Date   WBC 5.6 08/14/2014   HGB 11.5* 08/14/2014   HCT 35.1* 08/14/2014   MCV 83.0 08/14/2014   PLT 134* 08/14/2014    Recent Labs Lab 08/12/14 2230  08/14/14 0934  NA 139  < > 136  K 3.8  < > 3.9  CL 106  < > 107  CO2 24  < > 22  BUN 18  < > 18  CREATININE 1.46*  < > 1.41*  CALCIUM 8.7  < > 8.5  PROT 6.7  --   --   BILITOT 1.2  --   --   ALKPHOS 47  --   --   ALT 40  --   --   AST 37  --   --   GLUCOSE 125*  < > 152*  < > = values in this interval not displayed.  Radiology/Studies: Dg Abd Acute W/chest 07/28/2014   CLINICAL DATA:  Right left lower quadrant pain.  EXAM: ACUTE ABDOMEN SERIES (ABDOMEN 2 VIEW & CHEST 1 VIEW)  COMPARISON:  None.  FINDINGS: Cardiomegaly. Cardiac pacer noted with tip projected over right ventricle. No focal pulmonary infiltrate.  Soft tissue structures of the abdomen are unremarkable. The gas pattern is nonspecific. Surgical wiring noted over the abdomen. Surgical wires are fractured. Metallic densities noted over the left abdomen. Degenerative changes lumbar spine.  IMPRESSION: 1. Cardiomegaly.  Cardiac pacer with lead tip in right ventricle. 2. Postsurgical changes in the abdomen. Metallic densities noted of the left lower abdomen. No bowel distention or free air.   Electronically Signed   By: Marcello Moores  Register   On: 07/28/2014 13:25    EKG: ST, rate 102, LAE, poor R wave progression  TELEMETRY: sinus rhythm with PVC's  A/P:  1.  Ventricular tachycardia Pt with VT at 215msec in VF zone with subsequent 7 ICD shocks delivered  Change IV Amiodarone to po 400mg  bid x2 weeks then 200mg  bidx 4 weeks then  200mg  daily Cath yesterday with normal coronaries  2.  Elevated troponin related to demand ischemia from VT/ICD shocks No CAD by cath.  Elevated troponin likely due to ICD shock and myocardial injury  3.  Chronic systolic heart failure Continue Coreg Lisinopril and Lasix held yesterday in anticipation of cath Resume today and follow BMET  4.  CKD, stage 3 Follow BMET with resumption of Lisinopril/Lasix   Will plan transition to po amiodarone today and transfer to telemetry/abmulate tomorrow.  Plan discharge 1-2 days (hopefully tomorrow)

## 2014-08-14 NOTE — Progress Notes (Signed)
Ambulated 773ft with pt. HR and O2 sat remained stable. Pt noted to be taking deep breaths while walking, when asked why he said he felt slightly short of breath at times. Overall, pt tolerated well.

## 2014-08-15 LAB — BASIC METABOLIC PANEL
ANION GAP: 7 (ref 5–15)
BUN: 19 mg/dL (ref 6–23)
CALCIUM: 8.3 mg/dL — AB (ref 8.4–10.5)
CHLORIDE: 109 mmol/L (ref 96–112)
CO2: 21 mmol/L (ref 19–32)
CREATININE: 1.35 mg/dL (ref 0.50–1.35)
GFR calc Af Amer: 59 mL/min — ABNORMAL LOW (ref >90)
GFR calc non Af Amer: 51 mL/min — ABNORMAL LOW (ref >90)
GLUCOSE: 123 mg/dL — AB (ref 70–99)
POTASSIUM: 3.8 mmol/L (ref 3.5–5.1)
Sodium: 137 mmol/L (ref 135–145)

## 2014-08-15 LAB — GLUCOSE, CAPILLARY
Glucose-Capillary: 117 mg/dL — ABNORMAL HIGH (ref 70–99)
Glucose-Capillary: 133 mg/dL — ABNORMAL HIGH (ref 70–99)
Glucose-Capillary: 111 mg/dL — ABNORMAL HIGH (ref 70–99)

## 2014-08-15 NOTE — Progress Notes (Signed)
Pt washing up in bathroom independently per pt request; report called to [facility]; will transfer to floor when bath and lunch tray completed

## 2014-08-15 NOTE — Progress Notes (Signed)
SUBJECTIVE: The patient is doing well today.  At this time, he denies chest pain, shortness of breath, or any new concerns.  Marland Kitchen amiodarone  400 mg Oral BID  . aspirin EC  81 mg Oral Daily  . carvedilol  25 mg Oral BID WC  . digoxin  125 mcg Oral Daily  . furosemide  40 mg Oral Daily  . heparin  5,000 Units Subcutaneous 3 times per day  . insulin aspart  0-5 Units Subcutaneous QHS  . insulin aspart  0-9 Units Subcutaneous TID WC  . Linaclotide  145 mcg Oral Daily  . lisinopril  5 mg Oral Daily  . simvastatin  10 mg Oral QHS      OBJECTIVE: Physical Exam: Filed Vitals:   08/15/14 0300 08/15/14 0400 08/15/14 0500 08/15/14 0723  BP: 113/65 109/63 108/54   Pulse: 85 80 81 85  Temp: 99.8 F (37.7 C)   98.3 F (36.8 C)  TempSrc: Oral   Oral  Resp: 29 26 27 27   Height:      Weight: 210 lb 5.1 oz (95.4 kg)     SpO2: 96% 96% 94% 86%    Intake/Output Summary (Last 24 hours) at 08/15/14 0739 Last data filed at 08/15/14 0319  Gross per 24 hour  Intake  987.2 ml  Output    925 ml  Net   62.2 ml    Telemetry reveals sinus rhythm, NSVT observed x 1  GEN- The patient is well appearing, alert and oriented x 3 today.   Head- normocephalic, atraumatic Eyes-  Sclera clear, conjunctiva pink Ears- hearing intact Oropharynx- clear Neck- supple, no JVP Lymph- no cervical lymphadenopathy Lungs- Clear to ausculation bilaterally, normal work of breathing Heart- Regular rate and rhythm  GI- soft, NT, ND, + BS Extremities- no clubbing, cyanosis, or edema Skin- no rash or lesion Psych- euthymic mood, full affect Neuro- strength and sensation are intact  LABS: Basic Metabolic Panel:  Recent Labs  08/13/14 0111  08/14/14 0934 08/15/14 0248  NA  --   < > 136 137  K  --   < > 3.9 3.8  CL  --   < > 107 109  CO2  --   < > 22 21  GLUCOSE  --   < > 152* 123*  BUN  --   < > 18 19  CREATININE 1.38*  < > 1.41* 1.35  CALCIUM  --   < > 8.5 8.3*  MG 1.8  --   --   --   < > = values  in this interval not displayed. Liver Function Tests:  Recent Labs  08/12/14 2230  AST 37  ALT 40  ALKPHOS 47  BILITOT 1.2  PROT 6.7  ALBUMIN 3.5   No results for input(s): LIPASE, AMYLASE in the last 72 hours. CBC:  Recent Labs  08/13/14 1652 08/14/14 0934  WBC 3.6* 5.6  HGB 11.2* 11.5*  HCT 33.9* 35.1*  MCV 83.3 83.0  PLT 130* 134*   Cardiac Enzymes:  Recent Labs  08/13/14 0111 08/13/14 0800 08/13/14 1247  TROPONINI 1.71* 2.42* 1.93*   Fasting Lipid Panel:  Recent Labs  08/13/14 0253  CHOL 117  HDL 40  LDLCALC 67  TRIG 48  CHOLHDL 2.9   Thyroid Function Tests:  Recent Labs  08/13/14 0253  TSH 0.827   Anemia Panel: No results for input(s): VITAMINB12, FOLATE, FERRITIN, TIBC, IRON, RETICCTPCT in the last 72 hours.  RADIOLOGY: Dg Abd Acute W/chest  07/28/2014   CLINICAL DATA:  Right left lower quadrant pain.  EXAM: ACUTE ABDOMEN SERIES (ABDOMEN 2 VIEW & CHEST 1 VIEW)  COMPARISON:  None.  FINDINGS: Cardiomegaly. Cardiac pacer noted with tip projected over right ventricle. No focal pulmonary infiltrate.  Soft tissue structures of the abdomen are unremarkable. The gas pattern is nonspecific. Surgical wiring noted over the abdomen. Surgical wires are fractured. Metallic densities noted over the left abdomen. Degenerative changes lumbar spine.  IMPRESSION: 1. Cardiomegaly.  Cardiac pacer with lead tip in right ventricle. 2. Postsurgical changes in the abdomen. Metallic densities noted of the left lower abdomen. No bowel distention or free air.   Electronically Signed   By: Marcello Moores  Register   On: 07/28/2014 13:25    ASSESSMENT AND PLAN:   A/P:  1.  Ventricular tachycardia Stable on oral amiodarone Would continue to observe x 1 more day on oral amiodarone Discharge to home tomorrow Amiodarone 400mg  BID x 7 days then 200mg  BID x 4 weeks then 200mg  daily  2.  Elevated troponin related to demand ischemia from VT/ICD shocks No CAD by cath.  Elevated troponin  likely due to ICD shock and myocardial injury  3.  Chronic systolic heart failure Continue Coreg Resume lisinopril  4.  CKD, stage 3 Stable No change required today  Transfer to telemetry today, discharge tomorrow  Thompson Grayer, MD 08/15/2014 7:39 AM

## 2014-08-15 NOTE — Evaluation (Signed)
Physical Therapy Evaluation Patient Details Name: [name] MRN: [id number] DOB: 04/13/1941 Today's Date: 08/15/2014   History of Present Illness  74 yo man with PMH of T2DM, dyslipiemia, HTN, chronic systolic HF related to nonischemic cardiomyopathy dating to '99 with ICD from '08 who presents with palpitations multiple ICD firings with ICD interrogation revealing monomorphic VT.   Clinical Impression  Patient independent with all aspects of mobility, ambulated well, VSS on room air. No further acute PT needs at this time. OF NOTE: Patient with infiltrated IV site left anticubital region, nsg notified.    Follow Up Recommendations No PT follow up    Equipment Recommendations  None recommended by PT    Recommendations for Other Services       Precautions / Restrictions Restrictions Weight Bearing Restrictions: No      Mobility  Bed Mobility Overal bed mobility: Independent                Transfers Overall transfer level: Independent                  Ambulation/Gait Ambulation/Gait assistance: Independent Ambulation Distance (Feet): 460 Feet Assistive device: None Gait Pattern/deviations: WFL(Within Functional Limits)        Stairs            Wheelchair Mobility    Modified Rankin (Stroke Patients Only)       Balance Overall balance assessment: No apparent balance deficits (not formally assessed)                                           Pertinent Vitals/Pain Pain Assessment: No/denies pain    Home Living Family/patient expects to be discharged to:: Private residence Living Arrangements: Spouse/significant other Available Help at Discharge: Family Type of Home: House Home Access: Stairs to enter   Technical brewer of Steps: 1 Home Layout: Two level;Able to live on main level with bedroom/bathroom Home Equipment: Kasandra Knudsen - single point      Prior Function Level of Independence: Independent                Hand Dominance   Dominant Hand: Right    Extremity/Trunk Assessment   Upper Extremity Assessment: Overall WFL for tasks assessed           Lower Extremity Assessment: Overall WFL for tasks assessed         Communication   Communication: HOH  Cognition Arousal/Alertness: Awake/alert Behavior During Therapy: WFL for tasks assessed/performed Overall Cognitive Status: Within Functional Limits for tasks assessed                      General Comments      Exercises        Assessment/Plan    PT Assessment Patent does not need any further PT services  PT Diagnosis Difficulty walking   PT Problem List    PT Treatment Interventions     PT Goals (Current goals can be found in the Care Plan section) Acute Rehab PT Goals PT Goal Formulation: All assessment and education complete, DC therapy    Frequency     Barriers to discharge        Co-evaluation               End of Session   Activity Tolerance: Patient tolerated treatment well Patient left: in chair;with call bell/phone within  reach Nurse Communication: Mobility status         Time: 1011-1029 PT Time Calculation (min) (ACUTE ONLY): 18 min   Charges:   PT Evaluation $Initial PT Evaluation Tier I: 1 Procedure     PT G CodesDuncan Dull 2014/09/02, 11:33 AM Alben Deeds, PT DPT  [phone]

## 2014-08-16 DIAGNOSIS — IMO0001 Reserved for inherently not codable concepts without codable children: Secondary | ICD-10-CM

## 2014-08-16 DIAGNOSIS — R7989 Other specified abnormal findings of blood chemistry: Secondary | ICD-10-CM

## 2014-08-16 DIAGNOSIS — R778 Other specified abnormalities of plasma proteins: Secondary | ICD-10-CM | POA: Diagnosis present

## 2014-08-16 DIAGNOSIS — N183 Chronic kidney disease, stage 3 unspecified: Secondary | ICD-10-CM | POA: Diagnosis present

## 2014-08-16 DIAGNOSIS — Z0389 Encounter for observation for other suspected diseases and conditions ruled out: Secondary | ICD-10-CM

## 2014-08-16 DIAGNOSIS — I428 Other cardiomyopathies: Secondary | ICD-10-CM

## 2014-08-16 LAB — GLUCOSE, CAPILLARY
GLUCOSE-CAPILLARY: 107 mg/dL — AB (ref 70–99)
GLUCOSE-CAPILLARY: 161 mg/dL — AB (ref 70–99)
Glucose-Capillary: 100 mg/dL — ABNORMAL HIGH (ref 70–99)
Glucose-Capillary: 114 mg/dL — ABNORMAL HIGH (ref 70–99)

## 2014-08-16 MED ORDER — AMIODARONE HCL 200 MG PO TABS: 200.0000 mg | ORAL_TABLET | Freq: Every day | ORAL | Status: DC

## 2014-08-16 MED ORDER — AMIODARONE HCL 200 MG PO TABS: 200.0000 mg | ORAL_TABLET | Freq: Two times a day (BID) | ORAL | Status: DC

## 2014-08-16 MED ORDER — AMIODARONE HCL 400 MG PO TABS: 400.0000 mg | ORAL_TABLET | Freq: Two times a day (BID) | ORAL | Status: DC

## 2014-08-16 MED ORDER — ACETAMINOPHEN 325 MG PO TABS: 650.0000 mg | ORAL_TABLET | ORAL | Status: AC | PRN

## 2014-08-16 NOTE — Progress Notes (Signed)
   SUBJECTIVE: The patient is doing well today.  At this time, he denies chest pain, shortness of breath, or any new concerns.  Marland Kitchen amiodarone  400 mg Oral BID  . aspirin EC  81 mg Oral Daily  . carvedilol  25 mg Oral BID WC  . digoxin  125 mcg Oral Daily  . furosemide  40 mg Oral Daily  . heparin  5,000 Units Subcutaneous 3 times per day  . insulin aspart  0-5 Units Subcutaneous QHS  . insulin aspart  0-9 Units Subcutaneous TID WC  . Linaclotide  145 mcg Oral Daily  . lisinopril  5 mg Oral Daily  . simvastatin  10 mg Oral QHS      OBJECTIVE: Physical Exam: Filed Vitals:   08/15/14 1341 08/15/14 1949 08/16/14 0124 08/16/14 0518  BP: 104/72 102/62 101/53 95/61  Pulse: 82 77 82 80  Temp: 98.2 F (36.8 C) 98.6 F (37 C) 99.2 F (37.3 C) 97.6 F (36.4 C)  TempSrc: Oral Oral Oral Oral  Resp: 20 20 20 20   Height: 5\' 9"  (1.753 m)     Weight: 209 lb 11.2 oz (95.119 kg)   208 lb 1.8 oz (94.4 kg)  SpO2: 98% 96% 98% 80%    Intake/Output Summary (Last 24 hours) at 08/16/14 0254 Last data filed at 08/16/14 2706  Gross per 24 hour  Intake   1150 ml  Output    900 ml  Net    250 ml    Telemetry reveals sinus rhythm, NSVT observed x 1  GEN- The patient is well appearing, alert and oriented x 3 today.   Head- normocephalic, atraumatic Eyes-  Sclera clear, conjunctiva pink Ears- hearing intact Oropharynx- clear Neck- supple,   Lungs- Clear to ausculation bilaterally, normal work of breathing Heart- Regular rate and rhythm  GI- soft, NT, ND, + BS Extremities- no clubbing, cyanosis, or edema   LABS: Basic Metabolic Panel:  Recent Labs  08/14/14 0934 08/15/14 0248  NA 136 137  K 3.9 3.8  CL 107 109  CO2 22 21  GLUCOSE 152* 123*  BUN 18 19  CREATININE 1.41* 1.35  CALCIUM 8.5 8.3*   Liver Function Tests: No results for input(s): AST, ALT, ALKPHOS, BILITOT, PROT, ALBUMIN in the last 72 hours. No results for input(s): LIPASE, AMYLASE in the last 72  hours. CBC:  Recent Labs  08/13/14 1652 08/14/14 0934  WBC 3.6* 5.6  HGB 11.2* 11.5*  HCT 33.9* 35.1*  MCV 83.3 83.0  PLT 130* 134*   Cardiac Enzymes:  Recent Labs  08/13/14 1247  TROPONINI 1.93*   ASSESSMENT AND PLAN:   A/P:  1.  Ventricular tachycardia Stable on oral amiodarone Amiodarone 400mg  BID x 7 days then 200mg  BID x 4 weeks then 200mg  daily  2.  Elevated troponin related to demand ischemia from VT/ICD shocks No CAD by cath.  Elevated troponin likely due to ICD shock and myocardial injury  3.  Chronic systolic heart failure Continue Coreg Resume lisinopril  4.  CKD, stage 3 Stable No change required today  DC to home Follow-up with me as scheduled 4/16.  Thompson Grayer, MD 08/16/2014 9:21 AM

## 2014-08-16 NOTE — Discharge Instructions (Signed)
Amiodarone tabletsWhat is this medicine  Take 400 mg twice a day for two weeks Then 200 mg twice a day for 4 weeks Then 200 mg daily  AMIODARONE (a MEE oh da rone) is an antiarrhythmic drug. It helps make your heart beat regularly. Because of the side effects caused by this medicine, it is only used when other medicines have not worked. It is usually used for heartbeat problems that may be life threatening. This medicine may be used for other purposes; ask your health care provider or pharmacist if you have questions. COMMON BRAND NAME(S): Cordarone, Pacerone What should I tell my health care provider before I take this medicine? They need to know if you have any of these conditions: -liver disease -lung disease  -other heart problems -thyroid disease -an unusual or allergic reaction to amiodarone, iodine, other medicines, foods, dyes, or preservatives -pregnant or trying to get pregnant -breast-feeding How should I use this medicine? Take this medicine by mouth with a glass of water. Follow the directions on the prescription label. You can take this medicine with or without food. However, you should always take it the same way each time. Take your doses at regular intervals. Do not take your medicine more often than directed. Do not stop taking except on the advice of your doctor or health care professional. A special MedGuide will be given to you by the pharmacist with each prescription and refill. Be sure to read this information carefully each time. Talk to your pediatrician regarding the use of this medicine in children. Special care may be needed. Overdosage: If you think you have taken too much of this medicine contact a poison control center or emergency room at once. NOTE: This medicine is only for you. Do not share this medicine with others. What if I miss a dose? If you miss a dose, take it as soon as you can. If it is almost time for your next dose, take only that dose. Do not  take double or extra doses. What may interact with this medicine? Do not take this medicine with any of the following medications: -abarelix -apomorphine -arsenic trioxide -certain antibiotics like erythromycin, gemifloxacin, levofloxacin, pentamidine -certain medicines for depression like amoxapine, tricyclic antidepressants -certain medicines for fungal infections like fluconazole, itraconazole, ketoconazole, posaconazole, voriconazole -certain medicines for irregular heart beat like disopyramide, dofetilide, dronedarone, ibutilide, propafenone, sotalol -certain medicines for malaria like chloroquine, halofantrine -cisapride -droperidol -haloperidol -hawthorn -maprotiline -methadone -phenothiazines like chlorpromazine, mesoridazine, thioridazine -pimozide -ranolazine -red yeast rice -vardenafil -ziprasidone This medicine may also interact with the following medications: -antiviral medicines for HIV or AIDS -certain medicines for blood pressure, heart disease, irregular heart beat -certain medicines for cholesterol like atorvastatin, cerivastatin, lovastatin, simvastatin -certain medicines for hepatitis C like sofosbuvir and ledipasvir; sofosbuvir -certain medicines for seizures like phenytoin -certain medicines for thyroid problems -certain medicines that treat or prevent blood clots like warfarin -cholestyramine -cimetidine -clopidogrel -cyclosporine -dextromethorphan -diuretics -fentanyl -general anesthetics -grapefruit juice -lidocaine -loratadine -methotrexate -other medicines that prolong the QT interval (cause an abnormal heart rhythm) -procainamide -quinidine -rifabutin, rifampin, or rifapentine -St. John's Wort -trazodone This list may not describe all possible interactions. Give your health care provider a list of all the medicines, herbs, non-prescription drugs, or dietary supplements you use. Also tell them if you smoke, drink alcohol, or use illegal  drugs. Some items may interact with your medicine. What should I watch for while using this medicine? Your condition will be monitored closely when you first begin therapy. Often,  this drug is first started in a hospital or other monitored health care setting. Once you are on maintenance therapy, visit your doctor or health care professional for regular checks on your progress. Because your condition and use of this medicine carry some risk, it is a good idea to carry an identification card, necklace or bracelet with details of your condition, medications, and doctor or health care professional. Dennis Bast may get drowsy or dizzy. Do not drive, use machinery, or do anything that needs mental alertness until you know how this medicine affects you. Do not stand or sit up quickly, especially if you are an older patient. This reduces the risk of dizzy or fainting spells. This medicine can make you more sensitive to the sun. Keep out of the sun. If you cannot avoid being in the sun, wear protective clothing and use sunscreen. Do not use sun lamps or tanning beds/booths. You should have regular eye exams before and during treatment. Call your doctor if you have blurred vision, see halos, or your eyes become sensitive to light. Your eyes may get dry. It may be helpful to use a lubricating eye solution or artificial tears solution. If you are going to have surgery or a procedure that requires contrast dyes, tell your doctor or health care professional that you are taking this medicine. What side effects may I notice from receiving this medicine? Side effects that you should report to your doctor or health care professional as soon as possible: -allergic reactions like skin rash, itching or hives, swelling of the face, lips, or tongue -blue-gray coloring of the skin -blurred vision, seeing blue green halos, increased sensitivity of the eyes to light -breathing problems -chest pain -dark urine -fast, irregular  heartbeat -feeling faint or light-headed -intolerance to heat or cold -nausea or vomiting -pain and swelling of the scrotum -pain, tingling, numbness in feet, hands -redness, blistering, peeling or loosening of the skin, including inside the mouth -spitting up blood -stomach pain -sweating -unusual or uncontrolled movements of body -unusually weak or tired -weight gain or loss -yellowing of the eyes or skin Side effects that usually do not require medical attention (report to your doctor or health care professional if they continue or are bothersome): -change in sex drive or performance -constipation -dizziness -headache -loss of appetite -trouble sleeping This list may not describe all possible side effects. Call your doctor for medical advice about side effects. You may report side effects to FDA at 1-800-FDA-1088. Where should I keep my medicine? Keep out of the reach of children. Store at room temperature between 20 and 25 degrees C (68 and 77 degrees F). Protect from light. Keep container tightly closed. Throw away any unused medicine after the expiration date. NOTE: This sheet is a summary. It may not cover all possible information. If you have questions about this medicine, talk to your doctor, pharmacist, or health care provider.  2015, [employer]vier/Gold Standard. (2013-09-08 19:48:11)

## 2014-08-16 NOTE — Discharge Summary (Signed)
Patient ID: [name],  MRN: [id number], DOB/AGE: Mar 03, 1941 74 y.o.  Admit date: 08/12/2014 Discharge date: 08/16/2014  Primary Care Provider: Scarlette Calico, MD Primary Cardiologist: Dr Trey Sailors  Discharge Diagnoses Principal Problem:   Ventricular tachycardia Active Problems:   Type II diabetes mellitus with renal manifestations   Hyperlipidemia with target LDL less than 70   Essential hypertension, benign   Automatic implantable cardioverter-defibrillator in situ   Systolic CHF, chronic   AICD discharge   Non-ischemic cardiomyopathy-20-25%   Normal coronary arteries-08/13/14   Troponin level elevated   Chronic renal disease, stage III    Procedures: Coronary angiogram 08/13/14   Hospital Course:  74 yo man with PMH of T2DM, dyslipiemia, stage 3 CRI, HTN, and chronic systolic HF related to nonischemic cardiomyopathy dating to '99 with ICD from '08. The pt presented 08/12/14 who with palpitations and multiple ICD firings. ICD interrogation revealed monomorphic VT. The pt was admitted and started on IV Amiodarone. His Troponin was elevated and it was decided to proceed with diagnostic cath. This was done 08/13/14 by Dr Tamala Julian and revealed normal coronaries. The pt was seen in consult by Dr Rayann Heman. The plan is for OP Amiodarone loading- 400 mg BID x two weeks, 200 mg BID x four weeks, then 200 mg daily. Dr Rayann Heman has suggested he stay on his other current medications including Coreg and ACE. He has a f/u with Dr Rayann Heman in April. We will have Dr Stanford Breed see him in a week or two.  I personally spoke to the pharmacist at [facility] regarding his Amiodarone loading.   Discharge Vitals:  Blood pressure 93/55, pulse 75, temperature 97.9 F (36.6 C), temperature source Oral, resp. rate 20, height 5\' 9"  (1.753 m), weight 208 lb 1.8 oz (94.4 kg), SpO2 99 %.    Labs: Results for orders placed or performed during the hospital encounter of 08/12/14 (from the past 24 hour(s))  Glucose,  capillary     Status: Abnormal   Collection Time: 08/15/14  9:38 PM  Result Value Ref Range   Glucose-Capillary 111 (H) 70 - 99 mg/dL   Comment 1 Notify RN    Comment 2 Documented in Char   Glucose, capillary     Status: Abnormal   Collection Time: 08/16/14  4:02 AM  Result Value Ref Range   Glucose-Capillary 107 (H) 70 - 99 mg/dL  Glucose, capillary     Status: Abnormal   Collection Time: 08/16/14  7:23 AM  Result Value Ref Range   Glucose-Capillary 100 (H) 70 - 99 mg/dL  Glucose, capillary     Status: Abnormal   Collection Time: 08/16/14 12:55 PM  Result Value Ref Range   Glucose-Capillary 114 (H) 70 - 99 mg/dL  Glucose, capillary     Status: Abnormal   Collection Time: 08/16/14  4:19 PM  Result Value Ref Range   Glucose-Capillary 161 (H) 70 - 99 mg/dL    Disposition:      Follow-up Information    Follow up with Thompson Grayer, MD On 09/21/2014.   Specialty:  Cardiology   Why:  10:45   Contact information:   [place] [place] [address] [phone]       Follow up with Kirk Ruths, MD.   Specialty:  Cardiology   Why:  office will call you   Contact information:   [place] [address] [phone]       Discharge Medications:  Medication List    STOP taking these medications        simvastatin 20 MG tablet  Commonly known as:  ZOCOR      TAKE these medications        acetaminophen 325 MG tablet  Commonly known as:  TYLENOL  Take 2 tablets (650 mg total) by mouth every 4 (four) hours as needed for headache or mild pain.     amiodarone 400 MG tablet  Commonly known as:  PACERONE  Take 1 tablet (400 mg total) by mouth 2 (two) times daily.     amiodarone 200 MG tablet  Commonly known as:  PACERONE  Take 1 tablet (200 mg total) by mouth 2 (two) times daily.  Start taking on:  08/24/2014     amiodarone 200 MG tablet  Commonly known as:  PACERONE  Take 1 tablet (200 mg total) by mouth daily.  Start  taking on:  09/21/2014     aspirin 81 MG tablet  Take 81 mg by mouth daily.     carvedilol 25 MG tablet  Commonly known as:  COREG  Take 25 mg by mouth 2 (two) times daily with a meal.     COLCRYS 0.6 MG tablet  Generic drug:  colchicine  Take 0.6 mg by mouth 2 (two) times daily as needed (for gout symptoms).     digoxin 0.125 MG tablet  Commonly known as:  LANOXIN  Take 125 mcg by mouth daily.     furosemide 40 MG tablet  Commonly known as:  LASIX  Take 40 mg by mouth daily. Take daily as directed     Linaclotide 145 MCG Caps capsule  Commonly known as:  LINZESS  Take 1 capsule (145 mcg total) by mouth daily.     lisinopril 5 MG tablet  Commonly known as:  PRINIVIL,ZESTRIL  Take 5 mg by mouth daily.     Methotrexate Sodium (PF) 50 MG/2ML Soln  Inject 0.8 mLs as directed once a week.     TRADJENTA 5 MG Tabs tablet  Generic drug:  linagliptin  TAKE 1 TABLET BY MOUTH DAILY FOR DIABETES     Febuxostat 80 MG Tabs  Take 40-80 mg by mouth 2 (two) times daily. Take 1 tablet (80mg ) in the morning and take 1/2 tablet (40mg ) in the evening     ULORIC 80 MG Tabs  Generic drug:  Febuxostat  TAKE 1 TABLET (80 MG TOTAL) BY MOUTH DAILY.         Duration of Discharge Encounter: Greater than 30 minutes including physician time.  Angelena Form PA-C 08/16/2014 8:01 PM    Thompson Grayer MD

## 2014-08-16 NOTE — Significant Event (Signed)
Uloric medication  order clarified with Patient .Patient's home regiment  takes febuxostat (Uloric) 1 tablet 80 mg in morning and takes 40mg  in evening per day.

## 2014-08-17 ENCOUNTER — Telehealth: Payer: Self-pay | Admitting: *Deleted

## 2014-08-17 NOTE — Telephone Encounter (Signed)
Pt was on TCM list was d/c from [facility] 08/16/14 he had a coronary angiogram done 08/13/14. Pt will f/u with his cardiologist Dr. Stanford Breed. Did not schedule TCM apt...Johny Chess

## 2014-08-18 ENCOUNTER — Other Ambulatory Visit: Payer: Self-pay

## 2014-08-18 ENCOUNTER — Encounter (HOSPITAL_COMMUNITY): Payer: Self-pay | Admitting: *Deleted

## 2014-08-18 ENCOUNTER — Emergency Department (HOSPITAL_COMMUNITY)
Admission: EM | Admit: 2014-08-18 | Discharge: 2014-08-18 | Disposition: A | Discharge status: Home / Self Care | Payer: Medicare Other | Attending: Emergency Medicine | Admitting: Emergency Medicine

## 2014-08-18 ENCOUNTER — Ambulatory Visit (HOSPITAL_COMMUNITY)
Admission: RE | Admit: 2014-08-18 | Discharge: 2014-08-18 | Disposition: A | Discharge status: Home / Self Care | Payer: Medicare Other | Source: Ambulatory Visit | Attending: Emergency Medicine | Admitting: Emergency Medicine

## 2014-08-18 ENCOUNTER — Encounter (HOSPITAL_COMMUNITY): Payer: Self-pay | Admitting: Emergency Medicine

## 2014-08-18 DIAGNOSIS — I5022 Chronic systolic (congestive) heart failure: Secondary | ICD-10-CM | POA: Insufficient documentation

## 2014-08-18 DIAGNOSIS — E119 Type 2 diabetes mellitus without complications: Secondary | ICD-10-CM | POA: Diagnosis not present

## 2014-08-18 DIAGNOSIS — R011 Cardiac murmur, unspecified: Secondary | ICD-10-CM | POA: Diagnosis not present

## 2014-08-18 DIAGNOSIS — I82622 Acute embolism and thrombosis of deep veins of left upper extremity: Secondary | ICD-10-CM | POA: Diagnosis not present

## 2014-08-18 DIAGNOSIS — Z95810 Presence of automatic (implantable) cardiac defibrillator: Secondary | ICD-10-CM | POA: Insufficient documentation

## 2014-08-18 DIAGNOSIS — Z79899 Other long term (current) drug therapy: Secondary | ICD-10-CM | POA: Diagnosis not present

## 2014-08-18 DIAGNOSIS — Z7982 Long term (current) use of aspirin: Secondary | ICD-10-CM | POA: Diagnosis not present

## 2014-08-18 DIAGNOSIS — I472 Ventricular tachycardia: Secondary | ICD-10-CM | POA: Diagnosis not present

## 2014-08-18 DIAGNOSIS — Z9889 Other specified postprocedural states: Secondary | ICD-10-CM | POA: Insufficient documentation

## 2014-08-18 DIAGNOSIS — Z8546 Personal history of malignant neoplasm of prostate: Secondary | ICD-10-CM | POA: Insufficient documentation

## 2014-08-18 DIAGNOSIS — R2232 Localized swelling, mass and lump, left upper limb: Secondary | ICD-10-CM | POA: Diagnosis present

## 2014-08-18 DIAGNOSIS — E669 Obesity, unspecified: Secondary | ICD-10-CM | POA: Insufficient documentation

## 2014-08-18 DIAGNOSIS — M7989 Other specified soft tissue disorders: Secondary | ICD-10-CM | POA: Diagnosis not present

## 2014-08-18 DIAGNOSIS — I10 Essential (primary) hypertension: Secondary | ICD-10-CM | POA: Insufficient documentation

## 2014-08-18 DIAGNOSIS — I82612 Acute embolism and thrombosis of superficial veins of left upper extremity: Secondary | ICD-10-CM | POA: Insufficient documentation

## 2014-08-18 DIAGNOSIS — Z86718 Personal history of other venous thrombosis and embolism: Secondary | ICD-10-CM

## 2014-08-18 DIAGNOSIS — M1990 Unspecified osteoarthritis, unspecified site: Secondary | ICD-10-CM | POA: Insufficient documentation

## 2014-08-18 DIAGNOSIS — Z87891 Personal history of nicotine dependence: Secondary | ICD-10-CM | POA: Insufficient documentation

## 2014-08-18 DIAGNOSIS — Z4502 Encounter for adjustment and management of automatic implantable cardiac defibrillator: Secondary | ICD-10-CM | POA: Diagnosis present

## 2014-08-18 DIAGNOSIS — I808 Phlebitis and thrombophlebitis of other sites: Secondary | ICD-10-CM | POA: Diagnosis not present

## 2014-08-18 HISTORY — DX: Type 2 diabetes mellitus without complications: E11.9

## 2014-08-18 HISTORY — DX: Personal history of other venous thrombosis and embolism: Z86.718

## 2014-08-18 LAB — CBC WITH DIFFERENTIAL/PLATELET
Basophils Absolute: 0 10*3/uL (ref 0.0–0.1)
Basophils Relative: 0 % (ref 0–1)
Eosinophils Absolute: 0.1 10*3/uL (ref 0.0–0.7)
Eosinophils Relative: 2 % (ref 0–5)
HEMATOCRIT: 34.2 % — AB (ref 39.0–52.0)
Hemoglobin: 11.1 g/dL — ABNORMAL LOW (ref 13.0–17.0)
LYMPHS ABS: 1.2 10*3/uL (ref 0.7–4.0)
LYMPHS PCT: 16 % (ref 12–46)
MCH: 26.6 pg (ref 26.0–34.0)
MCHC: 32.5 g/dL (ref 30.0–36.0)
MCV: 81.8 fL (ref 78.0–100.0)
Monocytes Absolute: 1 10*3/uL (ref 0.1–1.0)
Monocytes Relative: 13 % — ABNORMAL HIGH (ref 3–12)
NEUTROS ABS: 5.1 10*3/uL (ref 1.7–7.7)
Neutrophils Relative %: 69 % (ref 43–77)
Platelets: 188 10*3/uL (ref 150–400)
RBC: 4.18 MIL/uL — ABNORMAL LOW (ref 4.22–5.81)
RDW: 15.8 % — ABNORMAL HIGH (ref 11.5–15.5)
WBC: 7.4 10*3/uL (ref 4.0–10.5)

## 2014-08-18 LAB — BASIC METABOLIC PANEL
ANION GAP: 9 (ref 5–15)
BUN: 22 mg/dL (ref 6–23)
CHLORIDE: 107 mmol/L (ref 96–112)
CO2: 24 mmol/L (ref 19–32)
CREATININE: 1.48 mg/dL — AB (ref 0.50–1.35)
Calcium: 8.8 mg/dL (ref 8.4–10.5)
GFR, EST AFRICAN AMERICAN: 53 mL/min — AB (ref >90)
GFR, EST NON AFRICAN AMERICAN: 46 mL/min — AB (ref >90)
GLUCOSE: 126 mg/dL — AB (ref 70–99)
Potassium: 4.1 mmol/L (ref 3.5–5.1)
Sodium: 140 mmol/L (ref 135–145)

## 2014-08-18 LAB — DIGOXIN LEVEL: Digoxin Level: 0.8 ng/mL (ref 0.8–2.0)

## 2014-08-18 MED ORDER — XARELTO VTE STARTER PACK 15 & 20 MG PO TBPK: 15.0000 mg | ORAL_TABLET | ORAL | Status: DC

## 2014-08-18 MED ORDER — AMIODARONE HCL 200 MG PO TABS: 200.0000 mg | ORAL_TABLET | Freq: Two times a day (BID) | ORAL | Status: DC

## 2014-08-18 MED ORDER — RIVAROXABAN (XARELTO) EDUCATION KIT FOR DVT/PE PATIENTS
PACK | Freq: Once | Status: AC
  Administered 2014-08-18: 12:00:00
  Filled 2014-08-18: qty 1

## 2014-08-18 MED ORDER — RIVAROXABAN 15 MG PO TABS
15.0000 mg | ORAL_TABLET | Freq: Two times a day (BID) | ORAL | Status: DC
  Administered 2014-08-18: 15 mg via ORAL
  Filled 2014-08-18 (×3): qty 1

## 2014-08-18 NOTE — ED Provider Notes (Signed)
CSN: [id number]     Arrival date & time 08/18/14  0309 History   First MD Initiated Contact with Patient 08/18/14 0327     This chart was scribed for Kalman Drape, MD by Forrestine Him, ED Scribe. This patient was seen in room B17C/B17C and the patient's care was started 3:36 AM.   Chief Complaint  Patient presents with  . AICD Problem   The history is provided by the patient. No language interpreter was used.    HPI Comments: [name] brought in by EMS is a 74 y.o. male with a PMHx of osteoarthritis, HTN, hyperlipidemia, DVT, and DM who presents to the Emergency Department here for an AICD issue this evening. Pt states his defibrillator fired at approximetely 310 AM this morning while in bed.  Patient reports it lightly fired.  No current chest pain or SOB. [name] recently had left and right heart catheterization with coronary angiogram 2/25. Pt denies any issues with procedure. However, pt has noticed redness, swelling, and warmth to left AC from IV site during prior admission. Pt with known allergy to Spironolactone.  Past Medical History  Diagnosis Date  . Obesity   . Prostate cancer   . Glucose intolerance (impaired glucose tolerance)   . Bursitis of elbow     left  . Osteoarthritis   . HTN (hypertension)   . HLD (hyperlipidemia)   . History of DVT (deep vein thrombosis)   . Chronic systolic heart failure     echo 4/13: EF 25-30%, mod MR, mod LAE, PASP 47  . NICM (nonischemic cardiomyopathy)     normal cors by cath in 1990s;  nuclear 8/08: no ischemia; inf/lat/dist ant/apical scar  . Moderate mitral regurgitation     echo 4/13  . Ventricular tachycardia 07/2014  . Diabetes mellitus without complication    Past Surgical History  Procedure Laterality Date  . Transurethral resection of prostate    . Cardiac defibrillator placement  10/09    STJ single chamber ICD   . Hernia repair    . Robot assisted laparoscopic radical prostatectomy  6/08  . Left and right heart  catheterization with coronary angiogram N/A 08/13/2014    Procedure: LEFT AND RIGHT HEART CATHETERIZATION WITH CORONARY ANGIOGRAM;  Surgeon: Sinclair Grooms, MD;  [facility] CATH LAB;  Service: Cardiovascular;  Laterality: N/A;   Family History  Problem Relation Age of Onset  . Cancer Father     prostate  . Heart disease Mother    History  Substance Use Topics  . Smoking status: Former Smoker -- 0.50 packs/day for 10 years    Types: Cigarettes    Quit date: 11/01/1975  . Smokeless tobacco: Never Used     Comment: quit over 30 years ago   . Alcohol Use: No     Comment: occasionally    Review of Systems  Respiratory: Negative for shortness of breath.   Cardiovascular: Negative for chest pain.  Skin: Positive for color change.  All other systems reviewed and are negative.     Allergies  Spironolactone  Home Medications   Prior to Admission medications   Medication Sig Start Date End Date Taking? Authorizing Provider  acetaminophen (TYLENOL) 325 MG tablet Take 2 tablets (650 mg total) by mouth every 4 (four) hours as needed for headache or mild pain. 08/16/14   Erlene Quan, PA-C  amiodarone (PACERONE) 200 MG tablet Take 1 tablet (200 mg total) by mouth daily. 09/21/14   Suanne Marker  G Barrett, PA-C  amiodarone (PACERONE) 200 MG tablet Take 1 tablet (200 mg total) by mouth 2 (two) times daily. 08/24/14 09/20/14  Rhonda G Barrett, PA-C  amiodarone (PACERONE) 400 MG tablet Take 1 tablet (400 mg total) by mouth 2 (two) times daily. 08/16/14 08/23/14  Evelene Croon Barrett, PA-C  aspirin 81 MG tablet Take 81 mg by mouth daily.      Historical Provider, MD  carvedilol (COREG) 25 MG tablet Take 25 mg by mouth 2 (two) times daily with a meal.      Historical Provider, MD  COLCRYS 0.6 MG tablet Take 0.6 mg by mouth 2 (two) times daily as needed (for gout symptoms).  03/11/14   Historical Provider, MD  digoxin (LANOXIN) 0.125 MG tablet Take 125 mcg by mouth daily.      Historical Provider, MD   Febuxostat 80 MG TABS Take 40-80 mg by mouth 2 (two) times daily. Take 1 tablet (80mg ) in the morning and take 1/2 tablet (40mg ) in the evening    Historical Provider, MD  furosemide (LASIX) 40 MG tablet Take 40 mg by mouth daily. Take daily as directed 09/14/11   Thompson Grayer, MD  Linaclotide [facility]) 145 MCG CAPS capsule Take 1 capsule (145 mcg total) by mouth daily. 07/28/14   Janith Lima, MD  lisinopril (PRINIVIL,ZESTRIL) 5 MG tablet Take 5 mg by mouth daily.    Historical Provider, MD  Methotrexate Sodium, PF, 50 MG/2ML SOLN Inject 0.8 mLs as directed once a week.    Historical Provider, MD  TRADJENTA 5 MG TABS tablet TAKE 1 TABLET BY MOUTH DAILY FOR DIABETES    Janith Lima, MD  ULORIC 80 MG TABS TAKE 1 TABLET (80 MG TOTAL) BY MOUTH DAILY. 04/14/14   Janith Lima, MD   Triage Vitals: BP 111/67 mmHg  Pulse 74  Temp(Src) 97.8 F (36.6 C) (Oral)  Resp 21  Ht 5\' 9"  (1.753 m)  Wt 206 lb (93.441 kg)  BMI 30.41 kg/m2  SpO2 99%   Physical Exam  Constitutional: He is oriented to person, place, and time. He appears well-developed and well-nourished. No distress.  HENT:  Head: Normocephalic and atraumatic.  Nose: Nose normal.  Mouth/Throat: Oropharynx is clear and moist.  Eyes: Conjunctivae and EOM are normal. Pupils are equal, round, and reactive to light.  Neck: Normal range of motion. Neck supple. No JVD present. No tracheal deviation present. No thyromegaly present.  Cardiovascular: Normal rate, regular rhythm and intact distal pulses.  Exam reveals no gallop and no friction rub.   Murmur heard. Pulmonary/Chest: Effort normal and breath sounds normal. No stridor. No respiratory distress. He has no wheezes. He has no rales. He exhibits no tenderness.  Abdominal: Soft. Bowel sounds are normal. He exhibits no distension and no mass. There is no tenderness. There is no rebound and no guarding.  Musculoskeletal: Normal range of motion. He exhibits tenderness. He exhibits no edema.   Patient has mild erythema to left before meals.  He has a firm cordlike structure coming from his left before meals and traveling up the medial aspect of his arm.  This area is mildly tender to palpation.  There is no thrill.  Lymphadenopathy:    He has no cervical adenopathy.  Neurological: He is alert and oriented to person, place, and time. He displays normal reflexes. He exhibits normal muscle tone. Coordination normal.  Skin: Skin is warm and dry. No rash noted. No erythema. No pallor.  Psychiatric: He has a normal  mood and affect. His behavior is normal. Judgment and thought content normal.  Nursing note and vitals reviewed.   ED Course  Procedures (including critical care time)  DIAGNOSTIC STUDIES: Oxygen Saturation is 99% on RA, Normal by my interpretation.    COORDINATION OF CARE: 3:35 AM- Will order digoxin level, BMP, CBC, and EKG. Discussed treatment plan with pt at bedside and pt agreed to plan.     Labs Review Labs Reviewed  BASIC METABOLIC PANEL - Abnormal; Notable for the following:    Glucose, Bld 126 (*)    Creatinine, Ser 1.48 (*)    GFR calc non Af Amer 46 (*)    GFR calc Af Amer 53 (*)    All other components within normal limits  CBC WITH DIFFERENTIAL/PLATELET - Abnormal; Notable for the following:    RBC 4.18 (*)    Hemoglobin 11.1 (*)    HCT 34.2 (*)    RDW 15.8 (*)    Monocytes Relative 13 (*)    All other components within normal limits  DIGOXIN LEVEL    Imaging Review No results found.   EKG Interpretation None      MDM   Final diagnoses:  AICD (automatic cardioverter/defibrillator) present  Phlebitis and thrombophlebitis of superficial veins of upper extremities   74 year old male with multiple medical problems, recent AICD firing due to V. fib who presents with a beeping noise from his device and concerned that his desire device had mildly fired.  [employer] has queried the device, it is working well, no discharges.  No episodes of VT or  VF.  Patient also noted to have a thrombophlebitis of his left upper arm, associated with recent IVs.  I have offered the patient a Doppler ultrasound to assure that he does not have a DVT.  Patient at this time wishes to come back for that study.  I personally performed the services described in this documentation, which was scribed in my presence. The recorded information has been reviewed and is accurate.     Kalman Drape, MD 08/18/14 [phone]

## 2014-08-18 NOTE — ED Notes (Signed)
Discharge instructions provided and patient states he received the same instructions on his previous visit and feels "very comfortable." wheel chair to the lobby. Wife at bedside during discharge instructions and she also states an understanding.

## 2014-08-18 NOTE — Discharge Instructions (Signed)
Deep Vein Thrombosis °A deep vein thrombosis (DVT) is a blood clot that develops in the deep, larger veins of the leg, arm, or pelvis. These are more dangerous than clots that might form in veins near the surface of the body. A DVT can lead to serious and even life-threatening complications if the clot breaks off and travels in the bloodstream to the lungs.  °A DVT can damage the valves in your leg veins so that instead of flowing upward, the blood pools in the lower leg. This is called post-thrombotic syndrome, and it can result in pain, swelling, discoloration, and sores on the leg. °CAUSES °Usually, several things contribute to the formation of blood clots. Contributing factors include: °· The flow of blood slows down. °· The inside of the vein is damaged in some way. °· You have a condition that makes blood clot more easily. °RISK FACTORS °Some people are more likely than others to develop blood clots. Risk factors include:  °· Smoking. °· Being overweight (obese). °· Sitting or lying still for a long time. This includes long-distance travel, paralysis, or recovery from an illness or surgery. °Other factors that increase risk are:  °· Older age, especially over 75 years of age. °· Having a family history of blood clots or if you have already had a blot clot. °· Having major or lengthy surgery. This is especially true for surgery on the hip, knee, or belly (abdomen). Hip surgery is particularly high risk. °· Having a long, thin tube (catheter) placed inside a vein during a medical procedure. °· Breaking a hip or leg. °· Having cancer or cancer treatment. °· Pregnancy and childbirth. °¨ Hormone changes make the blood clot more easily during pregnancy. °¨ The fetus puts pressure on the veins of the pelvis. °¨ There is a risk of injury to veins during delivery or a caesarean delivery. The risk is highest just after childbirth. °· Medicines containing the male hormone estrogen. This includes birth control pills and  hormone replacement therapy. °· Other circulation or heart problems. ° °SIGNS AND SYMPTOMS °When a clot forms, it can either partially or totally block the blood flow in that vein. Symptoms of a DVT can include: °· Swelling of the leg or arm, especially if one side is much worse. °· Warmth and redness of the leg or arm, especially if one side is much worse. °· Pain in an arm or leg. If the clot is in the leg, symptoms may be more noticeable or worse when standing or walking. °The symptoms of a DVT that has traveled to the lungs (pulmonary embolism, PE) usually start suddenly and include: °· Shortness of breath. °· Coughing. °· Coughing up blood or blood-tinged mucus. °· Chest pain. The chest pain is often worse with deep breaths. °· Rapid heartbeat. °Anyone with these symptoms should get emergency medical treatment right away. Do not wait to see if the symptoms will go away. Call your local emergency services (911 in the [place] if you have these symptoms. Do not drive yourself to the hospital. °DIAGNOSIS °If a DVT is suspected, your health care provider will take a full medical history and perform a physical exam. Tests that also may be required include: °· Blood tests, including studies of the clotting properties of the blood. °· Ultrasound to see if you have clots in your legs or lungs. °· X-rays to show the flow of blood when dye is injected into the veins (venogram). °· Studies of your lungs if you have any   chest symptoms. °PREVENTION °· Exercise the legs regularly. Take a brisk 30-minute walk every day. °· Maintain a weight that is appropriate for your height. °· Avoid sitting or lying in bed for long periods of time without moving your legs. °· Women, particularly those over the age of 35 years, should consider the risks and benefits of taking estrogen medicines, including birth control pills. °· Do not smoke, especially if you take estrogen medicines. °· Long-distance travel can increase your risk of DVT. You  should exercise your legs by walking or pumping the muscles every hour. °· Many of the risk factors above relate to situations that exist with hospitalization, either for illness, injury, or elective surgery. Prevention may include medical and nonmedical measures. °¨ Your health care provider will assess you for the need for venous thromboembolism prevention when you are admitted to the hospital. If you are having surgery, your surgeon will assess you the day of or day after surgery. °TREATMENT °Once identified, a DVT can be treated. It can also be prevented in some circumstances. Once you have had a DVT, you may be at increased risk for a DVT in the future. The most common treatment for DVT is blood-thinning (anticoagulant) medicine, which reduces the blood's tendency to clot. Anticoagulants can stop new blood clots from forming and stop old clots from growing. They cannot dissolve existing clots. Your body does this by itself over time. Anticoagulants can be given by mouth, through an IV tube, or by injection. Your health care provider will determine the best program for you. Other medicines or treatments that may be used are: °· Heparin or related medicines (low molecular weight heparin) are often the first treatment for a blood clot. They act quickly. However, they cannot be taken orally and must be given either in shot form or by IV tube. °¨ Heparin can cause a fall in a component of blood that stops bleeding and forms blood clots (platelets). You will be monitored with blood tests to be sure this does not occur. °· Warfarin is an anticoagulant that can be swallowed. It takes a few days to start working, so usually heparin or related medicines are used in combination. Once warfarin is working, heparin is usually stopped. °· Factor Xa inhibitor medicines, such as rivaroxaban and apixaban, also reduce blood clotting. These medicines are taken orally and can often be used without heparin or related  medicines. °· Less commonly, clot dissolving drugs (thrombolytics) are used to dissolve a DVT. They carry a high risk of bleeding, so they are used mainly in severe cases where your life or a part of your body is threatened. °· Very rarely, a blood clot in the leg needs to be removed surgically. °· If you are unable to take anticoagulants, your health care provider may arrange for you to have a filter placed in a main vein in your abdomen. This filter prevents clots from traveling to your lungs. °HOME CARE INSTRUCTIONS °· Take all medicines as directed by your health care provider. °· Learn as much as you can about DVT. °· Wear a medical alert bracelet or carry a medical alert card. °· Ask your health care provider how soon you can go back to normal activities. It is important to stay active to prevent blood clots. If you are on anticoagulant medicine, avoid contact sports. °· It is very important to exercise. This is especially important while traveling, sitting, or standing for long periods of time. Exercise your legs by walking or by   tightening and relaxing your leg muscles regularly. Take frequent walks. °· You may need to wear compression stockings. These are tight elastic stockings that apply pressure to the lower legs. This pressure can help keep the blood in the legs from clotting. °Taking Warfarin °Warfarin is a daily medicine that is taken by mouth. Your health care provider will advise you on the length of treatment (usually 3-6 months, sometimes lifelong). If you take warfarin: °· Understand how to take warfarin and foods that can affect how warfarin works in your body. °· Too much and too little warfarin are both dangerous. Too much warfarin increases the risk of bleeding. Too little warfarin continues to allow the risk for blood clots. °Warfarin and Regular Blood Testing °While taking warfarin, you will need to have regular blood tests to measure your blood clotting time. These blood tests usually  include both the prothrombin time (PT) and international normalized ratio (INR) tests. The PT and INR results allow your health care provider to adjust your dose of warfarin. It is very important that you have your PT and INR tested as often as directed by your health care provider.    °Warfarin and Your Diet °Avoid major changes in your diet, or notify your health care provider before changing your diet. Arrange a visit with a registered dietitian to answer your questions. Many foods, especially foods high in vitamin K, can interfere with warfarin and affect the PT and INR results. You should eat a consistent amount of foods high in vitamin K. Foods high in vitamin K include:  °· Spinach, kale, broccoli, cabbage, collard and turnip greens, Brussels sprouts, peas, cauliflower, seaweed, and parsley. °· Beef and pork liver. °· Green tea. °· Soybean oil. °Warfarin with Other Medicines °Many medicines can interfere with warfarin and affect the PT and INR results. You must: °· Tell your health care provider about any and all medicines, vitamins, and supplements you take, including aspirin and other over-the-counter anti-inflammatory medicines. Be especially cautious with aspirin and anti-inflammatory medicines. Ask your health care provider before taking these. °· Do not take or discontinue any prescribed or over-the-counter medicine except on the advice of your health care provider or pharmacist. °Warfarin Side Effects °Warfarin can have side effects, such as easy bruising and difficulty stopping bleeding. Ask your health care provider or pharmacist about other side effects of warfarin. You will need to: °· Hold pressure over cuts for longer than usual. °· Notify your dentist and other health care providers that you are taking warfarin before you undergo any procedures where bleeding may occur. °Warfarin with Alcohol and Tobacco  °· Drinking alcohol frequently can increase the effect of warfarin, leading to excess  bleeding. It is best to avoid alcoholic drinks or to consume only very small amounts while taking warfarin. Notify your health care provider if you change your alcohol intake.   °· Do not use any tobacco products including cigarettes, chewing tobacco, or electronic cigarettes. If you smoke, quit. Ask your health care provider for help with quitting smoking. °Alternative Medicines to Warfarin: Factor Xa Inhibitor Medicines °· These blood-thinning medicines are taken by mouth, usually for several weeks or longer. It is important to take the medicine every single day at the same time each day. °· There are no regular blood tests required when using these medicines. °· There are fewer food and drug interactions than with warfarin. °· The side effects of this class of medicine are similar to those of warfarin, including excessive bruising or bleeding. Ask your   health care provider or pharmacist about other potential side effects. SEEK MEDICAL CARE IF:  You notice a rapid heartbeat.  You feel weaker or more tired than usual.  You feel faint.  You notice increased bruising.  You feel your symptoms are not getting better in the time expected.  You believe you are having side effects of medicine. SEEK IMMEDIATE MEDICAL CARE IF:  You have chest pain.  You have trouble breathing.  You have new or increased swelling or pain in one leg.  You cough up blood.  You notice blood in vomit, in a bowel movement, or in urine. MAKE SURE YOU:  Understand these instructions.  Will watch your condition.  Will get help right away if you are not doing well or get worse. Document Released: 06/05/2005 Document Revised: 10/20/2013 Document Reviewed: 02/10/2013 [facility] Patient Information 2015 [place], [place]. This information is not intended to replace advice given to you by your health care provider. Make sure you discuss any questions you have with your health care  provider.  _____________________________  Information on my medicine - XARELTO (rivaroxaban)  This medication education was reviewed with me or my healthcare representative as part of my discharge preparation.    WHY WAS XARELTO PRESCRIBED FOR YOU? Xarelto was prescribed to treat blood clots that may have been found in the veins of your legs (deep vein thrombosis) or in your lungs (pulmonary embolism) and to reduce the risk of them occurring again.  What do you need to know about Xarelto? The starting dose is one 15 mg tablet taken TWICE daily with food for the FIRST 21 DAYS then on 09/08/14  the dose is changed to one 20 mg tablet taken ONCE A DAY with your evening meal.  DO NOT stop taking Xarelto without talking to the health care provider who prescribed the medication.  Refill your prescription for 20 mg tablets before you run out.  After discharge, you should have regular check-up appointments with your healthcare provider that is prescribing your Xarelto.  In the future your dose may need to be changed if your kidney function changes by a significant amount.  What do you do if you miss a dose? If you are taking Xarelto TWICE DAILY and you miss a dose, take it as soon as you remember. You may take two 15 mg tablets (total 30 mg) at the same time then resume your regularly scheduled 15 mg twice daily the next day.  If you are taking Xarelto ONCE DAILY and you miss a dose, take it as soon as you remember on the same day then continue your regularly scheduled once daily regimen the next day. Do not take two doses of Xarelto at the same time.   Important Safety Information Xarelto is a blood thinner medicine that can cause bleeding. You should call your healthcare provider right away if you experience any of the following: ? Bleeding from an injury or your nose that does not stop. ? Unusual colored urine (red or dark brown) or unusual colored stools (red or black). ? Unusual  bruising for unknown reasons. ? A serious fall or if you hit your head (even if there is no bleeding).  Some medicines may interact with Xarelto and might increase your risk of bleeding while on Xarelto. To help avoid this, consult your healthcare provider or pharmacist prior to using any new prescription or non-prescription medications, including herbals, vitamins, non-steroidal anti-inflammatory drugs (NSAIDs) and supplements.  This website has more information on Xarelto:  [email or web address]

## 2014-08-18 NOTE — Progress Notes (Signed)
Left upper extremity venous duplex completed.  Left:  DVT noted in the axillary vein with superficial thrombosis noted in the left basilic and antecubital communicating veins.  Right:  Negative for DVT in the subclavian vein.

## 2014-08-18 NOTE — ED Notes (Signed)
Patient presents via EMS.  Patient stated his AICD fired 1 time around 0310 tonight (patient was laying in bed trying to go to sleep).  At this time denies pain, SOB, etc.  Took 324mg  ASA at home.  EMS reports BS 107 BP 124/84, HR 70, R 16   Also left AC is red, warm and swollen from prior admission to the hospital.  Was told that the swelling would go away in time

## 2014-08-18 NOTE — ED Provider Notes (Signed)
CSN: [id number]     Arrival date & time 08/18/14  2297 History   First MD Initiated Contact with Patient 08/18/14 0930     Chief Complaint  Patient presents with  . Arm Swelling     (Consider location/radiation/quality/duration/timing/severity/associated sxs/prior Treatment) Patient is a 74 y.o. male presenting with general illness. The history is provided by the patient.  Illness Location:  Left upper arm Quality:  Swelling Severity:  Mild Onset quality:  Gradual Duration:  5 days Timing:  Constant Progression:  Unchanged Chronicity:  New Context:  Had IV in his LUE after a hospital stay Relieved by:  Nothing Worsened by:  Nothing Associated symptoms: no abdominal pain, no chest pain, no fatigue, no fever, no rash and no rhinorrhea     Past Medical History  Diagnosis Date  . Obesity   . Prostate cancer   . Glucose intolerance (impaired glucose tolerance)   . Bursitis of elbow     left  . Osteoarthritis   . HTN (hypertension)   . HLD (hyperlipidemia)   . History of DVT (deep vein thrombosis)   . Chronic systolic heart failure     echo 4/13: EF 25-30%, mod MR, mod LAE, PASP 47  . NICM (nonischemic cardiomyopathy)     normal cors by cath in 1990s;  nuclear 8/08: no ischemia; inf/lat/dist ant/apical scar  . Moderate mitral regurgitation     echo 4/13  . Ventricular tachycardia 07/2014  . Diabetes mellitus without complication    Past Surgical History  Procedure Laterality Date  . Transurethral resection of prostate    . Cardiac defibrillator placement  10/09    STJ single chamber ICD   . Hernia repair    . Robot assisted laparoscopic radical prostatectomy  6/08  . Left and right heart catheterization with coronary angiogram N/A 08/13/2014    Procedure: LEFT AND RIGHT HEART CATHETERIZATION WITH CORONARY ANGIOGRAM;  Surgeon: Sinclair Grooms, MD;  Location[facility] CATH LAB;  Service: Cardiovascular;  Laterality: N/A;   Family History  Problem Relation Age of Onset  .  Cancer Father     prostate  . Heart disease Mother    History  Substance Use Topics  . Smoking status: Former Smoker -- 0.50 packs/day for 10 years    Types: Cigarettes    Quit date: 11/01/1975  . Smokeless tobacco: Never Used     Comment: quit over 30 years ago   . Alcohol Use: No     Comment: occasionally    Review of Systems  Constitutional: Negative for fever and fatigue.  HENT: Negative for rhinorrhea.   Cardiovascular: Negative for chest pain.  Gastrointestinal: Negative for abdominal pain.  Skin: Negative for rash.  All other systems reviewed and are negative.     Allergies  Spironolactone  Home Medications   Prior to Admission medications   Medication Sig Start Date End Date Taking? Authorizing Provider  acetaminophen (TYLENOL) 325 MG tablet Take 2 tablets (650 mg total) by mouth every 4 (four) hours as needed for headache or mild pain. 08/16/14   Erlene Quan, PA-C  amiodarone (PACERONE) 200 MG tablet Take 1 tablet (200 mg total) by mouth daily. 09/21/14   Rhonda G Barrett, PA-C  amiodarone (PACERONE) 200 MG tablet Take 1 tablet (200 mg total) by mouth 2 (two) times daily. 08/24/14 09/20/14  Rhonda G Barrett, PA-C  amiodarone (PACERONE) 400 MG tablet Take 1 tablet (400 mg total) by mouth 2 (two) times daily. 08/16/14 08/23/14  Suanne Marker  G Barrett, PA-C  aspirin 81 MG tablet Take 81 mg by mouth daily.      Historical Provider, MD  carvedilol (COREG) 25 MG tablet Take 25 mg by mouth 2 (two) times daily with a meal.      Historical Provider, MD  COLCRYS 0.6 MG tablet Take 0.6 mg by mouth 2 (two) times daily as needed (for gout symptoms).  03/11/14   Historical Provider, MD  digoxin (LANOXIN) 0.125 MG tablet Take 125 mcg by mouth daily.      Historical Provider, MD  Febuxostat 80 MG TABS Take 40-80 mg by mouth 2 (two) times daily. Take 1 tablet (80mg ) in the morning and take 1/2 tablet (40mg ) in the evening    Historical Provider, MD  furosemide (LASIX) 40 MG tablet Take 40 mg by  mouth daily. Take daily as directed 09/14/11   Thompson Grayer, MD  Linaclotide [facility]) 145 MCG CAPS capsule Take 1 capsule (145 mcg total) by mouth daily. 07/28/14   Janith Lima, MD  lisinopril (PRINIVIL,ZESTRIL) 5 MG tablet Take 5 mg by mouth daily.    Historical Provider, MD  Methotrexate Sodium, PF, 50 MG/2ML SOLN Inject 0.8 mLs as directed once a week.    Historical Provider, MD  TRADJENTA 5 MG TABS tablet TAKE 1 TABLET BY MOUTH DAILY FOR DIABETES    Janith Lima, MD  ULORIC 80 MG TABS TAKE 1 TABLET (80 MG TOTAL) BY MOUTH DAILY. 04/14/14   Janith Lima, MD   BP 131/90 mmHg  Pulse 90  Temp(Src) 98.1 F (36.7 C) (Oral)  Resp 22  SpO2 98% Physical Exam  Constitutional: He is oriented to person, place, and time. He appears well-developed and well-nourished. No distress.  HENT:  Head: Normocephalic and atraumatic.  Mouth/Throat: Oropharynx is clear and moist. No oropharyngeal exudate.  Eyes: EOM are normal. Pupils are equal, round, and reactive to light.  Neck: Normal range of motion. Neck supple.  Cardiovascular: Normal rate and regular rhythm.  Exam reveals no friction rub.   No murmur heard. Pulmonary/Chest: Effort normal and breath sounds normal. No respiratory distress. He has no wheezes. He has no rales.  Abdominal: Soft. He exhibits no distension. There is no tenderness. There is no rebound.  Musculoskeletal: Normal range of motion. He exhibits no edema.  LUE swelling, mild Normal capillary refill Normal pulse No pitting edema No discoloration  Neurological: He is alert and oriented to person, place, and time.  Skin: Skin is warm. No rash noted. He is not diaphoretic.  Nursing note and vitals reviewed.   ED Course  Procedures (including critical care time) Labs Review Labs Reviewed - No data to display  Imaging Review No results found.   EKG Interpretation None      MDM   Final diagnoses:  DVT of upper extremity (deep vein thrombosis), left    34M  presents with LUE swelling and pain. Recently admitted for Vtach, had L and R heart catheterization, however was all done through R groin. L arm swelling for past 4 days. No tingling, numbness, decreased strength. AFVSS here. L arm with mild swelling. LUE US showed L axillary vein DVT. No concern for phlegmasia.  Will initiate Xarelto. Pharmacy contacted to do bedside teaching. I spoke with his PCP, Dr. Scarlette Calico, who is comfortable with this plan. Stable for discharge.  Evelina Bucy, MD 08/18/14 1054

## 2014-08-18 NOTE — Discharge Instructions (Signed)
MPORTANT PATIENT INSTRUCTIONS:  You have been scheduled for an Outpatient Vascular Study at [facility].  If tomorrow is a Saturday or Sunday, please go to the [facility] Emergency Department Registration Desk at 8:30 am tomorrow morning and tell them you are there for a vascular study.  If tomorrow is a weekday (Monday-Friday), please go to [facility] Department at 8:30 am tomorrow morning  and  tell them you are there for a vascular study.    Phlebitis Phlebitis is soreness and swelling (inflammation) of a vein. This can occur in your arms, legs, or torso (trunk), as well as deeper inside your body. Phlebitis is usually not serious when it occurs close to the surface of the body. However, it can cause serious problems when it occurs in a vein deeper inside the body. CAUSES  Phlebitis can be triggered by various things, including:   Reduced blood flow through your veins. This can happen with:  Bed rest over a long period.  Long-distance travel.  Injury.  Surgery.  Being overweight (obese) or pregnant.  Having an IV tube put in the vein and getting certain medicines through the vein.  Cancer and cancer treatment.  Use of illegal drugs taken through the vein.  Inflammatory diseases.  Inherited (genetic) diseases that increase the risk of blood clots.  Hormone therapy, such as birth control pills. SIGNS AND SYMPTOMS   Red, tender, swollen, and painful area on your skin. Usually, the area will be long and narrow.  Firmness along the center of the affected area. This can indicate that a blood clot has formed.  Low-grade fever. DIAGNOSIS  A health care provider can usually diagnose phlebitis by examining the affected area and asking about your symptoms. To check for infection or blood clots, your health care provider may order blood tests or an ultrasound exam of the area. Blood tests and your family history may also indicate if you have an underlying genetic  disease that causes blood clots. Occasionally, a piece of tissue is taken from the body (biopsy sample) if an unusual cause of phlebitis is suspected. TREATMENT  Treatment will vary depending on the severity of the condition and the area of the body affected. Treatment may include:  Use of a warm compress or heating pad.  Use of compression stockings or bandages.  Anti-inflammatory medicines.  Removal of any IV tube that may be causing the problem.  Medicines that kill germs (antibiotics) if an infection is present.  Blood-thinning medicines if a blood clot is suspected or present.  In rare cases, surgery may be needed to remove damaged sections of vein. HOME CARE INSTRUCTIONS   Only take over-the-counter or prescription medicines as directed by your health care provider. Take all medicines exactly as prescribed.  Raise (elevate) the affected area above the level of your heart as directed by your health care provider.  Apply a warm compress or heating pad to the affected area as directed by your health care provider. Do not sleep with the heating pad.  Use compression stockings or bandages as directed. These will speed healing and prevent the condition from coming back.  If you are on blood thinners:  Get follow-up blood tests as directed by your health care provider.  Check with your health care provider before using any new medicines.  Carry a medical alert card or wear your medical alert jewelry to show that you are on blood thinners.  For phlebitis in the legs:  Avoid prolonged standing  or bed rest.  Keep your legs moving. Raise your legs when sitting or lying.  Do not smoke.  Women, particularly those over the age of 59, should consider the risks and benefits of taking the contraceptive pill. This kind of hormone treatment can increase your risk for blood clots.  Follow up with your health care provider as directed. SEEK MEDICAL CARE IF:   You have unusual  bruising or any bleeding problems.  Your swelling or pain in the affected area is not improving.  You are on anti-inflammatory medicine, and you develop belly (abdominal) pain. SEEK IMMEDIATE MEDICAL CARE IF:   You have a sudden onset of chest pain or difficulty breathing.  You have a fever or persistent symptoms for more than 2-3 days.  You have a fever and your symptoms suddenly get worse. MAKE SURE YOU:  Understand these instructions.  Will watch your condition.  Will get help right away if you are not doing well or get worse. Document Released: 05/30/2001 Document Revised: 03/26/2013 Document Reviewed: 02/10/2013 [facility] Patient Information 2015 [place], [place]. This information is not intended to replace advice given to you by your health care provider. Make sure you discuss any questions you have with your health care provider.

## 2014-08-18 NOTE — ED Notes (Signed)
Pharmacist at bedside.

## 2014-08-18 NOTE — ED Notes (Signed)
Was an inpatient last week; IV placed on Thursday morning in left A/C, removed Saturday when discharged. The site was swollen on discharge. It has been unchanged since then. It is swollen, hard and painful.

## 2014-08-19 ENCOUNTER — Telehealth: Payer: Self-pay | Admitting: Internal Medicine

## 2014-08-19 ENCOUNTER — Telehealth: Payer: Self-pay | Admitting: Cardiology

## 2014-08-19 NOTE — Telephone Encounter (Signed)
New Msg        Pt wife states she was disconnected while trying to get submission to go through for pt.   Transmitter is beeping. Please call back.

## 2014-08-19 NOTE — Telephone Encounter (Signed)
Follow Up  Pt wife calling to see if transmission came through. She stated the machine keeps beeping, pt wife was advised to call 1-800 number. Pt wife wanted to speak w/ device rep to see if signal was successful. Please call back and dsicuss.

## 2014-08-19 NOTE — Telephone Encounter (Signed)
° ° ° °  1. Are you currently SOB (can you hear that pt is SOB on the phone)?   yes and no I can't  2. How long have you been experiencing SOB?   this am after taking morning med  3. Are you SOB when sitting or when up moving around?   both  4. Are you currently experiencing any other symptoms?   none  SENT TO NURSE

## 2014-08-19 NOTE — Telephone Encounter (Signed)
Informed pt that transmission was not received. Informed pt to call tech support to help trouble shooting monitor.

## 2014-08-19 NOTE — Telephone Encounter (Signed)
Spoke w/ pt and informed him that we keep getting alert that was taking care at  Hospital and in order for [place] to clear the alert we need him to send a manual transmission. Pt verbalized understanding.

## 2014-08-19 NOTE — Telephone Encounter (Signed)
He is feeling better now but feels he didn't eat enough for breakfast and took all his medications then did not feel well.  His wife made him eat more and he began to feel better.  He will call back with any more problems

## 2014-08-21 ENCOUNTER — Ambulatory Visit: Payer: Medicare Other | Admitting: Internal Medicine

## 2014-08-21 ENCOUNTER — Encounter: Payer: Self-pay | Admitting: Internal Medicine

## 2014-08-21 ENCOUNTER — Ambulatory Visit (INDEPENDENT_AMBULATORY_CARE_PROVIDER_SITE_OTHER): Payer: Medicare Other | Admitting: Internal Medicine

## 2014-08-21 VITALS — BP 158/80 | HR 91 | Temp 97.6°F | Resp 16 | Ht 69.0 in | Wt 204.5 lb

## 2014-08-21 DIAGNOSIS — I10 Essential (primary) hypertension: Secondary | ICD-10-CM | POA: Diagnosis not present

## 2014-08-21 DIAGNOSIS — I808 Phlebitis and thrombophlebitis of other sites: Secondary | ICD-10-CM | POA: Diagnosis not present

## 2014-08-21 DIAGNOSIS — M109 Gout, unspecified: Secondary | ICD-10-CM

## 2014-08-21 DIAGNOSIS — M1009 Idiopathic gout, multiple sites: Secondary | ICD-10-CM | POA: Diagnosis not present

## 2014-08-21 DIAGNOSIS — N189 Chronic kidney disease, unspecified: Secondary | ICD-10-CM

## 2014-08-21 DIAGNOSIS — I82A12 Acute embolism and thrombosis of left axillary vein: Secondary | ICD-10-CM | POA: Insufficient documentation

## 2014-08-21 DIAGNOSIS — E1122 Type 2 diabetes mellitus with diabetic chronic kidney disease: Secondary | ICD-10-CM

## 2014-08-21 MED ORDER — OXYCODONE-ACETAMINOPHEN 7.5-325 MG PO TABS: 1.0000 | ORAL_TABLET | ORAL | Status: DC | PRN

## 2014-08-21 MED ORDER — METHYLPREDNISOLONE ACETATE 40 MG/ML IJ SUSP: 40.0000 mg | Freq: Once | INTRAMUSCULAR | Status: DC

## 2014-08-21 MED ORDER — HYDROCODONE-ACETAMINOPHEN 10-325 MG PO TABS: 1.0000 | ORAL_TABLET | Freq: Three times a day (TID) | ORAL | Status: DC | PRN

## 2014-08-21 NOTE — Patient Instructions (Signed)

## 2014-08-21 NOTE — Progress Notes (Signed)
Pre visit review using our clinic review tool, if applicable. No additional management support is needed unless otherwise documented below in the visit note. 

## 2014-08-21 NOTE — Progress Notes (Signed)
Subjective:    Patient ID: [name], male    DOB: [birth year], 74 y.o.   MRN: [id number]  Arthritis Presents for initial visit. The disease course has been worsening. He complains of pain, stiffness, joint swelling and joint warmth. Affected locations include the right knee. His pain is at a severity of 6/10. Associated symptoms include pain at night and pain while resting. Pertinent negatives include no diarrhea, dry eyes, dry mouth, dysuria, fatigue, fever, rash, Raynaud's syndrome, uveitis or weight loss. His past medical history is significant for osteoarthritis. Past treatments include acetaminophen and allopurinol. The treatment provided mild relief.      Review of Systems  Constitutional: Negative.  Negative for fever, chills, weight loss, diaphoresis, appetite change and fatigue.  HENT: Negative.   Eyes: Negative.   Respiratory: Negative.  Negative for cough, choking, chest tightness, shortness of breath and stridor.   Cardiovascular: Negative.  Negative for chest pain, palpitations and leg swelling.  Gastrointestinal: Negative.  Negative for nausea, vomiting, abdominal pain, diarrhea, constipation and blood in stool.  Endocrine: Negative.   Genitourinary: Negative.  Negative for dysuria.  Musculoskeletal: Positive for joint swelling, arthritis and stiffness. Negative for myalgias, back pain, gait problem, neck pain and neck stiffness.  Skin: Negative.  Negative for rash.  Allergic/Immunologic: Negative.   Neurological: Negative.  Negative for dizziness.  Hematological: Negative.  Negative for adenopathy. Does not bruise/bleed easily.  Psychiatric/Behavioral: Negative.        Objective:   Physical Exam  Constitutional: He is oriented to person, place, and time. He appears well-developed and well-nourished.  Non-toxic appearance. He does not have a sickly appearance. He does not appear ill. No distress.  HENT:  Head: Normocephalic and atraumatic.  Mouth/Throat: Oropharynx  is clear and moist. No oropharyngeal exudate.  Eyes: Conjunctivae are normal. Right eye exhibits no discharge. Left eye exhibits no discharge. No scleral icterus.  Neck: Normal range of motion. Neck supple. No JVD present. No tracheal deviation present. No thyromegaly present.  Cardiovascular: Normal rate, regular rhythm, normal heart sounds and intact distal pulses.  Exam reveals no gallop and no friction rub.   No murmur heard. Pulmonary/Chest: Effort normal and breath sounds normal. No stridor. No respiratory distress. He has no wheezes. He has no rales. He exhibits no tenderness.  Abdominal: Soft. Bowel sounds are normal. He exhibits no distension and no mass. There is no tenderness. There is no rebound and no guarding.  Musculoskeletal: He exhibits no edema.       Right knee: He exhibits decreased range of motion and swelling. He exhibits no effusion, no ecchymosis, no deformity, no laceration, no erythema, normal alignment, no LCL laxity and normal patellar mobility. Tenderness found.       Left upper arm: He exhibits swelling and deformity. He exhibits no tenderness, no bony tenderness, no edema and no laceration.       Arms: Rt knee clean with betadine then prepped and draped in sterile fashion, using a 25 gauge 1.5 inch needle the joint space was entered from the anterior/medial approach, 40 mg of depo-medrol and 1 cc of 0.5% plain marcaine was easily injected into the joint space, no fluid was aspirated from the joint, he tolerated this well with no complications of blood loss. A band aid was applied.  Lymphadenopathy:    He has no cervical adenopathy.  Neurological: He is oriented to person, place, and time.  Skin: Skin is warm and dry. No rash noted. He is not diaphoretic. No  erythema. No pallor.  Vitals reviewed.    Summary: Findings consistent with acute deep vein thrombosis involving the left axillary vein with superficial thrombosis noted in the left basilic and antecubital  communicating veins Lab Results  Component Value Date   WBC 7.4 08/18/2014   HGB 11.1* 08/18/2014   HCT 34.2* 08/18/2014   PLT 188 08/18/2014   GLUCOSE 126* 08/18/2014   CHOL 117 08/13/2014   TRIG 48 08/13/2014   HDL 40 08/13/2014   LDLCALC 67 08/13/2014   ALT 40 08/12/2014   AST 37 08/12/2014   NA 140 08/18/2014   K 4.1 08/18/2014   CL 107 08/18/2014   CREATININE 1.48* 08/18/2014   BUN 22 08/18/2014   CO2 24 08/18/2014   TSH 0.827 08/13/2014   PSA <0.01 ng/mL* 11/09/2008   INR 1.09 08/13/2014   HGBA1C 6.6* 07/28/2014   MICROALBUR 1.0 07/28/2014      Assessment & Plan:

## 2014-08-23 ENCOUNTER — Emergency Department (HOSPITAL_COMMUNITY): Payer: Medicare Other

## 2014-08-23 ENCOUNTER — Encounter (HOSPITAL_COMMUNITY): Payer: Self-pay | Admitting: Emergency Medicine

## 2014-08-23 ENCOUNTER — Emergency Department (HOSPITAL_COMMUNITY)
Admission: EM | Admit: 2014-08-23 | Discharge: 2014-08-23 | Disposition: A | Discharge status: Home / Self Care | Payer: Medicare Other | Attending: Emergency Medicine | Admitting: Emergency Medicine

## 2014-08-23 DIAGNOSIS — I472 Ventricular tachycardia: Secondary | ICD-10-CM | POA: Diagnosis not present

## 2014-08-23 DIAGNOSIS — Z95810 Presence of automatic (implantable) cardiac defibrillator: Secondary | ICD-10-CM | POA: Insufficient documentation

## 2014-08-23 DIAGNOSIS — R0600 Dyspnea, unspecified: Secondary | ICD-10-CM | POA: Diagnosis not present

## 2014-08-23 DIAGNOSIS — M1990 Unspecified osteoarthritis, unspecified site: Secondary | ICD-10-CM | POA: Diagnosis not present

## 2014-08-23 DIAGNOSIS — Z86718 Personal history of other venous thrombosis and embolism: Secondary | ICD-10-CM | POA: Diagnosis not present

## 2014-08-23 DIAGNOSIS — Z79899 Other long term (current) drug therapy: Secondary | ICD-10-CM | POA: Diagnosis not present

## 2014-08-23 DIAGNOSIS — E119 Type 2 diabetes mellitus without complications: Secondary | ICD-10-CM | POA: Diagnosis not present

## 2014-08-23 DIAGNOSIS — R42 Dizziness and giddiness: Secondary | ICD-10-CM | POA: Diagnosis present

## 2014-08-23 DIAGNOSIS — I10 Essential (primary) hypertension: Secondary | ICD-10-CM | POA: Insufficient documentation

## 2014-08-23 DIAGNOSIS — E669 Obesity, unspecified: Secondary | ICD-10-CM | POA: Insufficient documentation

## 2014-08-23 DIAGNOSIS — E785 Hyperlipidemia, unspecified: Secondary | ICD-10-CM | POA: Diagnosis not present

## 2014-08-23 DIAGNOSIS — Z9889 Other specified postprocedural states: Secondary | ICD-10-CM | POA: Insufficient documentation

## 2014-08-23 DIAGNOSIS — Z8546 Personal history of malignant neoplasm of prostate: Secondary | ICD-10-CM | POA: Diagnosis not present

## 2014-08-23 DIAGNOSIS — Z87891 Personal history of nicotine dependence: Secondary | ICD-10-CM | POA: Diagnosis not present

## 2014-08-23 DIAGNOSIS — Z7982 Long term (current) use of aspirin: Secondary | ICD-10-CM | POA: Diagnosis not present

## 2014-08-23 DIAGNOSIS — I5022 Chronic systolic (congestive) heart failure: Secondary | ICD-10-CM | POA: Insufficient documentation

## 2014-08-23 LAB — COMPREHENSIVE METABOLIC PANEL
ALK PHOS: 57 U/L (ref 39–117)
ALT: 79 U/L — ABNORMAL HIGH (ref 0–53)
ANION GAP: 7 (ref 5–15)
AST: 60 U/L — AB (ref 0–37)
Albumin: 3.1 g/dL — ABNORMAL LOW (ref 3.5–5.2)
BILIRUBIN TOTAL: 1.2 mg/dL (ref 0.3–1.2)
BUN: 31 mg/dL — ABNORMAL HIGH (ref 6–23)
CALCIUM: 8.6 mg/dL (ref 8.4–10.5)
CO2: 28 mmol/L (ref 19–32)
CREATININE: 1.64 mg/dL — AB (ref 0.50–1.35)
Chloride: 102 mmol/L (ref 96–112)
GFR calc Af Amer: 47 mL/min — ABNORMAL LOW (ref >90)
GFR, EST NON AFRICAN AMERICAN: 40 mL/min — AB (ref >90)
Glucose, Bld: 145 mg/dL — ABNORMAL HIGH (ref 70–99)
Potassium: 4.3 mmol/L (ref 3.5–5.1)
SODIUM: 137 mmol/L (ref 135–145)
TOTAL PROTEIN: 6.5 g/dL (ref 6.0–8.3)

## 2014-08-23 LAB — CBC WITH DIFFERENTIAL/PLATELET
Basophils Absolute: 0 10*3/uL (ref 0.0–0.1)
Basophils Relative: 0 % (ref 0–1)
EOS ABS: 0.1 10*3/uL (ref 0.0–0.7)
Eosinophils Relative: 1 % (ref 0–5)
HEMATOCRIT: 33.7 % — AB (ref 39.0–52.0)
Hemoglobin: 11.2 g/dL — ABNORMAL LOW (ref 13.0–17.0)
LYMPHS PCT: 12 % (ref 12–46)
Lymphs Abs: 1.3 10*3/uL (ref 0.7–4.0)
MCH: 26.9 pg (ref 26.0–34.0)
MCHC: 33.2 g/dL (ref 30.0–36.0)
MCV: 81 fL (ref 78.0–100.0)
Monocytes Absolute: 1.4 10*3/uL — ABNORMAL HIGH (ref 0.1–1.0)
Monocytes Relative: 14 % — ABNORMAL HIGH (ref 3–12)
NEUTROS PCT: 73 % (ref 43–77)
Neutro Abs: 7.5 10*3/uL (ref 1.7–7.7)
PLATELETS: 295 10*3/uL (ref 150–400)
RBC: 4.16 MIL/uL — AB (ref 4.22–5.81)
RDW: 16.3 % — ABNORMAL HIGH (ref 11.5–15.5)
WBC: 10.3 10*3/uL (ref 4.0–10.5)

## 2014-08-23 LAB — BRAIN NATRIURETIC PEPTIDE: B Natriuretic Peptide: 233.7 pg/mL — ABNORMAL HIGH (ref 0.0–100.0)

## 2014-08-23 LAB — TROPONIN I: Troponin I: 0.03 ng/mL (ref <0.031)

## 2014-08-23 NOTE — ED Notes (Signed)
Pt arrives from home, awoken by wife because "he wasn't breathing right to me, he looked like he need air." Pt speaking in complete sentences, no increased wob with ambulation or talking.

## 2014-08-23 NOTE — Assessment & Plan Note (Signed)
xarelto has been started and he feels like this is improving

## 2014-08-23 NOTE — Assessment & Plan Note (Signed)
Depo-medrol and marcaine injected into the joint Will restart colchicine Will ice and rest He can try norco for pain relief

## 2014-08-23 NOTE — Assessment & Plan Note (Signed)
His BP is well controlled 

## 2014-08-23 NOTE — ED Provider Notes (Signed)
CSN: [id number]     Arrival date & time 08/23/14  0420 History   First MD Initiated Contact with Patient 08/23/14 [phone]     Chief Complaint  Patient presents with  . Dizziness      HPI Patient presented emergency department tonight because the wife states that he woke from sleep and seemed to be sweaty and short of breath.  At this time he reports absolutely no symptoms.  Denies chest pain shortness of breath.  He does have a history of nonischemic cardiomyopathy with a defibrillator in place.  He was hospitalized last week after monomorphic VT and several ICD discharges.  He's now been placed on amiodarone and is doing well.  During his last hospitalization he also underwent cardiac catheterization which demonstrated normal coronary arteries.  He reports he has had productive cough over the past 24 hours without fever or chills.   Past Medical History  Diagnosis Date  . Obesity   . Prostate cancer   . Glucose intolerance (impaired glucose tolerance)   . Bursitis of elbow     left  . Osteoarthritis   . HTN (hypertension)   . HLD (hyperlipidemia)   . History of DVT (deep vein thrombosis)   . Chronic systolic heart failure     echo 4/13: EF 25-30%, mod MR, mod LAE, PASP 47  . NICM (nonischemic cardiomyopathy)     normal cors by cath in 1990s;  nuclear 8/08: no ischemia; inf/lat/dist ant/apical scar  . Moderate mitral regurgitation     echo 4/13  . Ventricular tachycardia 07/2014  . Diabetes mellitus without complication    Past Surgical History  Procedure Laterality Date  . Transurethral resection of prostate    . Cardiac defibrillator placement  10/09    STJ single chamber ICD   . Hernia repair    . Robot assisted laparoscopic radical prostatectomy  6/08  . Left and right heart catheterization with coronary angiogram N/A 08/13/2014    Procedure: LEFT AND RIGHT HEART CATHETERIZATION WITH CORONARY ANGIOGRAM;  Surgeon: Sinclair Grooms, MD;  Location: [facility] CATH LAB;  Service:  Cardiovascular;  Laterality: N/A;   Family History  Problem Relation Age of Onset  . Cancer Father     prostate  . Heart disease Mother    History  Substance Use Topics  . Smoking status: Former Smoker -- 0.50 packs/day for 10 years    Types: Cigarettes    Quit date: 11/01/1975  . Smokeless tobacco: Never Used     Comment: quit over 30 years ago   . Alcohol Use: No     Comment: occasionally    Review of Systems  All other systems reviewed and are negative.     Allergies  Spironolactone  Home Medications   Prior to Admission medications   Medication Sig Start Date End Date Taking? Authorizing Provider  acetaminophen (TYLENOL) 325 MG tablet Take 2 tablets (650 mg total) by mouth every 4 (four) hours as needed for headache or mild pain. 08/16/14  Yes Erlene Quan, PA-C  amiodarone (PACERONE) 200 MG tablet Take 1 tablet (200 mg total) by mouth 2 (two) times daily. Patient taking differently: Take 400 mg by mouth 2 (two) times daily.  08/24/14 09/20/14 Yes Lelon Perla, MD  aspirin 81 MG tablet Take 81 mg by mouth daily.     Yes Historical Provider, MD  carvedilol (COREG) 25 MG tablet Take 25 mg by mouth 2 (two) times daily with a meal.  Yes Historical Provider, MD  Cholecalciferol (VITAMIN D-3) 1000 UNITS CAPS Take 1,000 Units by mouth daily.   Yes Historical Provider, MD  COLCRYS 0.6 MG tablet Take 0.6 mg by mouth 2 (two) times daily as needed (for gout symptoms).  03/11/14  Yes Historical Provider, MD  digoxin (LANOXIN) 0.125 MG tablet Take 125 mcg by mouth daily.     Yes Historical Provider, MD  furosemide (LASIX) 40 MG tablet Take 40 mg by mouth daily. Take daily as directed 09/14/11  Yes Thompson Grayer, MD  HYDROcodone-acetaminophen (NORCO) 10-325 MG per tablet Take 1 tablet by mouth every 8 (eight) hours as needed. Patient taking differently: Take 1 tablet by mouth every 8 (eight) hours as needed for moderate pain.  08/21/14  Yes Janith Lima, MD  Linaclotide [facility]) 145  MCG CAPS capsule Take 1 capsule (145 mcg total) by mouth daily. Patient taking differently: Take 145 mcg by mouth daily as needed (constipation).  07/28/14  Yes Janith Lima, MD  lisinopril (PRINIVIL,ZESTRIL) 5 MG tablet Take 10 mg by mouth daily.    Yes Historical Provider, MD  Methotrexate Sodium, PF, 50 MG/2ML SOLN Inject 0.8 mLs as directed once a week.   Yes Historical Provider, MD  simvastatin (ZOCOR) 20 MG tablet Take 20 mg by mouth daily.   Yes Historical Provider, MD  TRADJENTA 5 MG TABS tablet TAKE 1 TABLET BY MOUTH DAILY FOR DIABETES   Yes Janith Lima, MD  ULORIC 80 MG TABS TAKE 1 TABLET (80 MG TOTAL) BY MOUTH DAILY. Patient taking differently: TAKE 2 TABLETS IN THE MORNING AND 1 TABLET IN THE EVENING. 04/14/14  Yes Janith Lima, MD  XARELTO STARTER PACK 15 & 20 MG TBPK Take 15-20 mg by mouth as directed. Take as directed on package: Start with one 15mg  tablet by mouth twice a day with food. On Day 22, switch to one 20mg  tablet once a day with food. 08/18/14  Yes Evelina Bucy, MD  Febuxostat 80 MG TABS Take 40-80 mg by mouth 2 (two) times daily. Take 1 tablet (80mg ) in the morning and take 1/2 tablet (40mg ) in the evening    Historical Provider, MD   BP 125/89 mmHg  Pulse 81  Temp(Src) 97.5 F (36.4 C) (Oral)  Resp 16  Ht 5\' 9"  (1.753 m)  Wt 204 lb (92.534 kg)  BMI 30.11 kg/m2  SpO2 99% Physical Exam  Constitutional: He is oriented to person, place, and time. He appears well-developed and well-nourished.  HENT:  Head: Normocephalic and atraumatic.  Eyes: EOM are normal.  Neck: Normal range of motion.  Cardiovascular: Normal rate, regular rhythm, normal heart sounds and intact distal pulses.   Pulmonary/Chest: Effort normal and breath sounds normal. No respiratory distress.  Abdominal: Soft. He exhibits no distension. There is no tenderness.  Musculoskeletal: Normal range of motion.  Neurological: He is alert and oriented to person, place, and time.  Skin: Skin is warm and  dry.  Psychiatric: He has a normal mood and affect. Judgment normal.  Nursing note and vitals reviewed.   ED Course  Procedures (including critical care time)  Cardiac Cath Feb 2016 Results ANGIOGRAPHIC DATA: The left main coronary artery is normal.. The left anterior descending artery is very large and normal and gives origin to 3 diagonal branches. The first branch has a course of the ramus intermedius. No obstructions are noted in the LAD or its branches.  The left circumflex artery is small, giving origin to one obtuse marginal branch,  and is normal.. The right coronary artery is very small but codominant. The posterior interventricular groove is supplied predominantly by the wraparound LAD. LEFT VENTRICULOGRAM: Left ventricular angiogram was not done.  IMPRESSIONS: 1. Normal coronary arteries 2. Moderate to severe pulmonary hypertension. 3. Systemic hypotension. 4. Known severe left ventricular systolic dysfunction and documentation of mildly elevated pulmonary wedge/LV filling pressures.   Labs Review Labs Reviewed  CBC WITH DIFFERENTIAL/PLATELET - Abnormal; Notable for the following:    RBC 4.16 (*)    Hemoglobin 11.2 (*)    HCT 33.7 (*)    RDW 16.3 (*)    Monocytes Relative 14 (*)    Monocytes Absolute 1.4 (*)    All other components within normal limits  COMPREHENSIVE METABOLIC PANEL - Abnormal; Notable for the following:    Glucose, Bld 145 (*)    BUN 31 (*)    Creatinine, Ser 1.64 (*)    Albumin 3.1 (*)    AST 60 (*)    ALT 79 (*)    GFR calc non Af Amer 40 (*)    GFR calc Af Amer 47 (*)    All other components within normal limits  BRAIN NATRIURETIC PEPTIDE - Abnormal; Notable for the following:    B Natriuretic Peptide 233.7 (*)    All other components within normal limits  TROPONIN I    Imaging Review Dg Chest 2 View  08/23/2014   CLINICAL DATA:  Shortness of breath  EXAM: CHEST  2 VIEW  COMPARISON:  07/28/2014  FINDINGS: Stable cardiomegaly. A mildly  redundant single chamber ICD/ pacer lead into the right ventricle has an unchanged appearance. Mild aortic tortuosity is also stable.  There is pulmonary venous congestion without edema. No effusion or pneumothorax. No evidence for pneumonia.  IMPRESSION: Cardiomegaly and pulmonary venous hypertension.   Electronically Signed   By: Monte Fantasia M.D.   On: 08/23/2014 06:43    ECG interpretation  Date: 08/23/2014  Rate: 79  Rhythm: normal sinus rhythm  QRS Axis: normal  Intervals: normal  ST/T Wave abnormalities: normal  Conduction Disutrbances: none  Narrative Interpretation:   Old EKG Reviewed: No significant changes noted     MDM   Final diagnoses:  Dyspnea    Overall the patient is well-appearing.  He is without any symptoms at this time.  Labs, chest x-ray, EKG without acute abnormalities.  Coronary arteries are normal on heart catheterization in February 2016.  Doubt ACS.  Doubt PE.  Doubt dissection.  Likely developing bronchitis.  Good about some degree of bronchospasm which causes transient shortness of breath.  Asymptomatic this time.  Discharge home in good condition.  Primary care follow-up.  Patient understands to return to the ER for new or worsening symptoms    Hoy Morn, MD 08/23/14 [phone]

## 2014-08-24 ENCOUNTER — Ambulatory Visit: Payer: Medicare Other | Admitting: Internal Medicine

## 2014-08-24 ENCOUNTER — Other Ambulatory Visit: Payer: Self-pay | Admitting: Internal Medicine

## 2014-08-27 ENCOUNTER — Ambulatory Visit (INDEPENDENT_AMBULATORY_CARE_PROVIDER_SITE_OTHER): Payer: Medicare Other | Admitting: *Deleted

## 2014-08-27 ENCOUNTER — Encounter: Payer: Self-pay | Admitting: Physician Assistant

## 2014-08-27 ENCOUNTER — Telehealth: Payer: Self-pay | Admitting: Cardiology

## 2014-08-27 ENCOUNTER — Ambulatory Visit (INDEPENDENT_AMBULATORY_CARE_PROVIDER_SITE_OTHER): Payer: Medicare Other | Admitting: Physician Assistant

## 2014-08-27 VITALS — BP 114/62 | HR 68 | Ht 69.0 in | Wt 201.0 lb

## 2014-08-27 DIAGNOSIS — I10 Essential (primary) hypertension: Secondary | ICD-10-CM

## 2014-08-27 DIAGNOSIS — I429 Cardiomyopathy, unspecified: Secondary | ICD-10-CM | POA: Diagnosis not present

## 2014-08-27 DIAGNOSIS — I4729 Other ventricular tachycardia: Secondary | ICD-10-CM

## 2014-08-27 DIAGNOSIS — I420 Dilated cardiomyopathy: Secondary | ICD-10-CM | POA: Diagnosis not present

## 2014-08-27 DIAGNOSIS — Z0389 Encounter for observation for other suspected diseases and conditions ruled out: Secondary | ICD-10-CM

## 2014-08-27 DIAGNOSIS — I428 Other cardiomyopathies: Secondary | ICD-10-CM

## 2014-08-27 DIAGNOSIS — I472 Ventricular tachycardia, unspecified: Secondary | ICD-10-CM

## 2014-08-27 DIAGNOSIS — I5022 Chronic systolic (congestive) heart failure: Secondary | ICD-10-CM

## 2014-08-27 DIAGNOSIS — IMO0001 Reserved for inherently not codable concepts without codable children: Secondary | ICD-10-CM

## 2014-08-27 NOTE — Telephone Encounter (Signed)
Pt. States hes has been feeling weak dizzy past couple of days and wants to be today, pt will be seen at 2:30 with bryan

## 2014-08-27 NOTE — Assessment & Plan Note (Signed)
The patient appears euvolemic. No changes to current medications

## 2014-08-27 NOTE — Assessment & Plan Note (Signed)
blood pressure well-controlled

## 2014-08-27 NOTE — Assessment & Plan Note (Signed)
Interrogation of the AICD revealed no episodes of ventricular tachycardia. He will continue amiodarone loading as discussed in the hospital. Not sure will what his sensation he was feeling is related to sounds like it could be GI related more like heartburn. I think he is anxious and worried that he could have another ICD firing.

## 2014-08-27 NOTE — Telephone Encounter (Signed)
Pt called in stating that for the past couple of days he has been feeling dizzy and weak and he would like to come in and be seen. Please call  Thanks

## 2014-08-27 NOTE — Patient Instructions (Signed)
Your physician recommends that you continue on your current medications as directed. Please refer to the Current Medication list given to you today.  Keep your appt with Dr.Allred on 09/21/14 @ 10:45am

## 2014-08-27 NOTE — Progress Notes (Signed)
Patient ID: [name], male   DOB: [birth year], 74 y.o.   MRN: [id number]    Date:  08/27/2014   ID:  Wake, [name] 12/14/1940, MRN [id number]  PCP:  Scarlette Calico, MD  Primary Cardiologist:  Crenshaw/Allred  Chief Complaint:  Warm sensation in his chest.    History of Present Illness: [name] is a 74 y.o. 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 The pt presented 08/12/14  with palpitations and multiple ICD firings. ICD interrogation revealed monomorphic VT. The pt was admitted and started on IV Amiodarone. His Troponin was elevated and it was decided to proceed with diagnostic cath. This was done 08/13/14 by Dr Tamala Julian and revealed normal coronaries. The pt was seen in consult by Dr Rayann Heman. The plan is for OP Amiodarone loading- 400 mg BID x two weeks, 200 mg BID x four weeks, then 200 mg daily. Dr Rayann Heman has suggested he stay on his other current medications including Coreg and ACE. He has a f/u with Dr Rayann Heman in April.   1. Normal coronary arteries 2. Moderate to severe pulmonary hypertension. 3. Systemic hypotension. 4. Known severe left ventricular systolic dysfunction and documentation of mildly elevated pulmonary wedge/LV filling pressures.  The patient presents today with the feeling of "warm air rising up through" his chest along with chest tightness.  He felt light headed for about 45 mins yesterday.  He felt the sensation again today.  He sleeps on 3 pillows for comfort.  Some intermittent SOB.  We interrogated his device and there were no episodes.    The patient currently denies nausea, vomiting, fever, chest pain, shortness of breath, orthopnea, dizziness, PND, cough, congestion, abdominal pain, hematochezia, melena, lower extremity edema, claudication.  Wt Readings from Last 3 Encounters:  08/27/14 201 lb (91.173 kg)  08/23/14 204 lb (92.534 kg)  08/21/14 204 lb 8 oz  (92.761 kg)     Past Medical History  Diagnosis Date  . Obesity   . Prostate cancer   . Glucose intolerance (impaired glucose tolerance)   . Bursitis of elbow     left  . Osteoarthritis   . HTN (hypertension)   . HLD (hyperlipidemia)   . History of DVT (deep vein thrombosis)   . Chronic systolic heart failure     echo 4/13: EF 25-30%, mod MR, mod LAE, PASP 47  . NICM (nonischemic cardiomyopathy)     normal cors by cath in 1990s;  nuclear 8/08: no ischemia; inf/lat/dist ant/apical scar  . Moderate mitral regurgitation     echo 4/13  . Ventricular tachycardia 07/2014  . Diabetes mellitus without complication     Current Outpatient Prescriptions  Medication Sig Dispense Refill  . acetaminophen (TYLENOL) 325 MG tablet Take 2 tablets (650 mg total) by mouth every 4 (four) hours as needed for headache or mild pain.    Marland Kitchen amiodarone (PACERONE) 200 MG tablet Take 1 tablet (200 mg total) by mouth 2 (two) times daily. (Patient taking differently: Take 400 mg by mouth 2 (two) times daily. ) 60 tablet 6  . aspirin 81 MG tablet Take 81 mg by mouth daily.      . carvedilol (COREG) 25 MG tablet Take 25 mg by mouth 2 (two) times daily with a meal.      . Cholecalciferol (VITAMIN D-3) 1000 UNITS CAPS Take 1,000 Units by mouth daily.    Marland Kitchen  COLCRYS 0.6 MG tablet Take 0.6 mg by mouth 2 (two) times daily as needed (for gout symptoms).     Marland Kitchen digoxin (LANOXIN) 0.125 MG tablet Take 125 mcg by mouth daily.      . Febuxostat 80 MG TABS Take 40-80 mg by mouth 2 (two) times daily. Take 1 tablet (80mg ) in the morning and take 1/2 tablet (40mg ) in the evening    . furosemide (LASIX) 40 MG tablet Take 40 mg by mouth daily. Take daily as directed    . HYDROcodone-acetaminophen (NORCO) 10-325 MG per tablet Take 1 tablet by mouth every 8 (eight) hours as needed. (Patient taking differently: Take 1 tablet by mouth every 8 (eight) hours as needed for moderate pain. ) 65 tablet 0  . Linaclotide (LINZESS) 145 MCG CAPS  capsule Take 1 capsule (145 mcg total) by mouth daily. (Patient taking differently: Take 145 mcg by mouth daily as needed (constipation). ) 30 capsule 11  . lisinopril (PRINIVIL,ZESTRIL) 5 MG tablet Take 10 mg by mouth daily.     . Methotrexate Sodium, PF, 50 MG/2ML SOLN Inject 0.8 mLs as directed once a week.    . simvastatin (ZOCOR) 20 MG tablet Take 20 mg by mouth daily.    . TRADJENTA 5 MG TABS tablet TAKE 1 TABLET BY MOUTH DAILY FOR DIABETES 90 tablet 2  . ULORIC 80 MG TABS TAKE 1 TABLET (80 MG TOTAL) BY MOUTH DAILY. (Patient taking differently: TAKE 2 TABLETS IN THE MORNING AND 1 TABLET IN THE EVENING.) 90 tablet 2  . XARELTO STARTER PACK 15 & 20 MG TBPK Take 15-20 mg by mouth as directed. Take as directed on package: Start with one 15mg  tablet by mouth twice a day with food. On Day 22, switch to one 20mg  tablet once a day with food. 51 each 0   Current Facility-Administered Medications  Medication Dose Route Frequency Provider Last Rate Last Dose  . methylPREDNISolone acetate (DEPO-MEDROL) injection 40 mg  40 mg Intra-articular Once Janith Lima, MD        Allergies:    Allergies  Allergen Reactions  . Spironolactone Other (See Comments)    Cannot remember reaction    Social History:  The patient  reports that he quit smoking about 38 years ago. His smoking use included Cigarettes. He has a 5 pack-year smoking history. He has never used smokeless tobacco. He reports that he does not drink alcohol or use illicit drugs.   Family history:   Family History  Problem Relation Age of Onset  . Cancer Father     prostate  . Heart disease Mother     ROS:  Please see the history of present illness.  All other systems reviewed and negative.   PHYSICAL EXAM: VS:  BP 114/62 mmHg  Pulse 68  Ht 5\' 9"  (1.753 m)  Wt 201 lb (91.173 kg)  BMI 29.67 kg/m2  SpO2 98% Well nourished, well developed, in no acute distress HEENT: Pupils are equal round react to light accommodation extraocular  movements are intact.  Neck: no JVDNo cervical lymphadenopathy. Cardiac: Regular rate and rhythm without murmurs rubs or gallops. Lungs:  clear to auscultation bilaterally, no wheezing, rhonchi or rales Abd: soft, nontender, positive bowel sounds all quadrants, no hepatosplenomegaly Ext: no lower extremity edema.  2+ radial and dorsalis pedis pulses. Skin: warm and dry Neuro:  Grossly normal    ASSESSMENT AND PLAN:  Problem List Items Addressed This Visit    Systolic CHF, chronic   Paroxysmal  ventricular tachycardia    Interrogation of the AICD revealed no episodes of ventricular tachycardia. He will continue amiodarone loading as discussed in the hospital. Not sure will what his sensation he was feeling is related to sounds like it could be GI related more like heartburn. I think he is anxious and worried that he could have another ICD firing.      Normal coronary arteries-08/13/14   Non-ischemic cardiomyopathy-20-25%    The patient appears euvolemic. No changes to current medications      Essential hypertension, benign - Primary    blood pressure well-controlled

## 2014-08-27 NOTE — Progress Notes (Signed)
ICD check in clinic w/Bryan Monterey, [place]. Normal device function. Threshold and sensing consistent with previous device measurements. Impedance trends stable over time. No evidence of any ventricular arrhythmias since ICD shocks on 2/24 (pt was hospitalized). Histogram distribution appropriate for patient and level of activity. No changes made this session. Device programmed at appropriate safety margins. Device programmed to optimize intrinsic conduction. Estimated longevity 4.2 years. Pt enrolled in remote follow-up. Plan to follow up with JA on 4/4 @ 1045. Patient education completed including shock plan. Vibration demonstrated for patient.

## 2014-08-28 LAB — MDC_IDC_ENUM_SESS_TYPE_INCLINIC
Battery Remaining Longevity: 50.4 mo
Brady Statistic RV Percent Paced: 0.01 %
HIGH POWER IMPEDANCE MEASURED VALUE: 48.9237
Implantable Pulse Generator Serial Number: 665247
Lead Channel Impedance Value: 350 Ohm
Lead Channel Pacing Threshold Amplitude: 1.5 V
Lead Channel Pacing Threshold Amplitude: 1.5 V
Lead Channel Pacing Threshold Pulse Width: 0.5 ms
Lead Channel Setting Pacing Amplitude: 3 V
Lead Channel Setting Sensing Sensitivity: 0.3 mV
MDC IDC MSMT BATTERY VOLTAGE: 2.57 V
MDC IDC MSMT LEADCHNL RV PACING THRESHOLD PULSEWIDTH: 0.5 ms
MDC IDC MSMT LEADCHNL RV SENSING INTR AMPL: 8.5 mV
MDC IDC SESS DTM: 20160310194946
MDC IDC SET LEADCHNL RV PACING PULSEWIDTH: 0.5 ms
Zone Setting Detection Interval: 300 ms

## 2014-08-30 ENCOUNTER — Emergency Department (HOSPITAL_COMMUNITY): Payer: Medicare Other

## 2014-08-30 ENCOUNTER — Emergency Department (HOSPITAL_COMMUNITY)
Admission: EM | Admit: 2014-08-30 | Discharge: 2014-08-30 | Disposition: A | Discharge status: Home / Self Care | Payer: Medicare Other | Attending: Emergency Medicine | Admitting: Emergency Medicine

## 2014-08-30 ENCOUNTER — Encounter (HOSPITAL_COMMUNITY): Payer: Self-pay | Admitting: Emergency Medicine

## 2014-08-30 DIAGNOSIS — I5022 Chronic systolic (congestive) heart failure: Secondary | ICD-10-CM | POA: Insufficient documentation

## 2014-08-30 DIAGNOSIS — M1990 Unspecified osteoarthritis, unspecified site: Secondary | ICD-10-CM | POA: Diagnosis not present

## 2014-08-30 DIAGNOSIS — R059 Cough, unspecified: Secondary | ICD-10-CM

## 2014-08-30 DIAGNOSIS — Z95810 Presence of automatic (implantable) cardiac defibrillator: Secondary | ICD-10-CM | POA: Diagnosis not present

## 2014-08-30 DIAGNOSIS — Z87891 Personal history of nicotine dependence: Secondary | ICD-10-CM | POA: Insufficient documentation

## 2014-08-30 DIAGNOSIS — R05 Cough: Secondary | ICD-10-CM

## 2014-08-30 DIAGNOSIS — J189 Pneumonia, unspecified organism: Secondary | ICD-10-CM

## 2014-08-30 DIAGNOSIS — Z7982 Long term (current) use of aspirin: Secondary | ICD-10-CM | POA: Diagnosis not present

## 2014-08-30 DIAGNOSIS — J159 Unspecified bacterial pneumonia: Secondary | ICD-10-CM | POA: Insufficient documentation

## 2014-08-30 DIAGNOSIS — E119 Type 2 diabetes mellitus without complications: Secondary | ICD-10-CM | POA: Diagnosis not present

## 2014-08-30 DIAGNOSIS — I472 Ventricular tachycardia: Secondary | ICD-10-CM | POA: Insufficient documentation

## 2014-08-30 DIAGNOSIS — Z8546 Personal history of malignant neoplasm of prostate: Secondary | ICD-10-CM | POA: Insufficient documentation

## 2014-08-30 DIAGNOSIS — I10 Essential (primary) hypertension: Secondary | ICD-10-CM | POA: Insufficient documentation

## 2014-08-30 DIAGNOSIS — Z9889 Other specified postprocedural states: Secondary | ICD-10-CM | POA: Diagnosis not present

## 2014-08-30 DIAGNOSIS — E669 Obesity, unspecified: Secondary | ICD-10-CM | POA: Diagnosis not present

## 2014-08-30 DIAGNOSIS — Z79899 Other long term (current) drug therapy: Secondary | ICD-10-CM | POA: Diagnosis not present

## 2014-08-30 DIAGNOSIS — Z86718 Personal history of other venous thrombosis and embolism: Secondary | ICD-10-CM | POA: Diagnosis not present

## 2014-08-30 DIAGNOSIS — R042 Hemoptysis: Secondary | ICD-10-CM | POA: Diagnosis present

## 2014-08-30 DIAGNOSIS — E785 Hyperlipidemia, unspecified: Secondary | ICD-10-CM | POA: Diagnosis not present

## 2014-08-30 LAB — I-STAT CHEM 8, ED
BUN: 22 mg/dL (ref 6–23)
Calcium, Ion: 1.23 mmol/L (ref 1.13–1.30)
Chloride: 102 mmol/L (ref 96–112)
Creatinine, Ser: 1.3 mg/dL (ref 0.50–1.35)
Glucose, Bld: 156 mg/dL — ABNORMAL HIGH (ref 70–99)
HCT: 36 % — ABNORMAL LOW (ref 39.0–52.0)
Hemoglobin: 12.2 g/dL — ABNORMAL LOW (ref 13.0–17.0)
Potassium: 4.2 mmol/L (ref 3.5–5.1)
SODIUM: 142 mmol/L (ref 135–145)
TCO2: 22 mmol/L (ref 0–100)

## 2014-08-30 LAB — CBC
HEMATOCRIT: 32.2 % — AB (ref 39.0–52.0)
Hemoglobin: 10.4 g/dL — ABNORMAL LOW (ref 13.0–17.0)
MCH: 26.7 pg (ref 26.0–34.0)
MCHC: 32.3 g/dL (ref 30.0–36.0)
MCV: 82.8 fL (ref 78.0–100.0)
PLATELETS: 278 10*3/uL (ref 150–400)
RBC: 3.89 MIL/uL — ABNORMAL LOW (ref 4.22–5.81)
RDW: 16.5 % — ABNORMAL HIGH (ref 11.5–15.5)
WBC: 6.5 10*3/uL (ref 4.0–10.5)

## 2014-08-30 LAB — TYPE AND SCREEN
ABO/RH(D): A POS
Antibody Screen: NEGATIVE

## 2014-08-30 LAB — PROTIME-INR
INR: 2.31 — ABNORMAL HIGH (ref 0.00–1.49)
Prothrombin Time: 25.6 seconds — ABNORMAL HIGH (ref 11.6–15.2)

## 2014-08-30 LAB — DIGOXIN LEVEL: Digoxin Level: 0.8 ng/mL (ref 0.8–2.0)

## 2014-08-30 LAB — ABO/RH: ABO/RH(D): A POS

## 2014-08-30 LAB — BRAIN NATRIURETIC PEPTIDE: B NATRIURETIC PEPTIDE 5: 220.8 pg/mL — AB (ref 0.0–100.0)

## 2014-08-30 LAB — APTT: aPTT: 47 seconds — ABNORMAL HIGH (ref 24–37)

## 2014-08-30 MED ORDER — LEVOFLOXACIN 750 MG PO TABS: 750.0000 mg | ORAL_TABLET | Freq: Every day | ORAL | Status: DC

## 2014-08-30 MED ORDER — IOHEXOL 350 MG/ML SOLN
100.0000 mL | Freq: Once | INTRAVENOUS | Status: AC | PRN
  Administered 2014-08-30: 100 mL via INTRAVENOUS

## 2014-08-30 MED ORDER — LEVOFLOXACIN IN D5W 750 MG/150ML IV SOLN
750.0000 mg | Freq: Once | INTRAVENOUS | Status: AC
  Administered 2014-08-30: 750 mg via INTRAVENOUS
  Filled 2014-08-30: qty 150

## 2014-08-30 NOTE — Discharge Instructions (Signed)
You may continue to have a small amount of bleeding when you cough - this should not be heavy or persistent bleeding - if it is, return to the ER immediately.

## 2014-08-30 NOTE — ED Notes (Signed)
Patient returned from X-ray 

## 2014-08-30 NOTE — ED Provider Notes (Signed)
CSN: [id number]     Arrival date & time 08/30/14  1045 History   First MD Initiated Contact with Patient 08/30/14 1128     Chief Complaint  Patient presents with  . Hemoptysis     (Consider location/radiation/quality/duration/timing/severity/associated sxs/prior Treatment) HPI   74 year old male, recently diagnosed with a left upper extremity venous thromboembolism, treated with oral anticoagulants, presents after having coughing and chest congestion for approximately 5-7 days. Today he was finally able to bring up some phlegm, he has been using cough drops but no other over-the-counter medications. When he coughed up phlegm and had a bright red blood-tinged to it, he brought this with him in a tissue paper. He denies chest pain, swelling of the lower extremities, abdominal pain, fevers chills nausea or vomiting. This cough has been persistent, nothing makes it better or worse. 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 Obesity   . Prostate cancer   . Glucose intolerance (impaired glucose tolerance)   . Bursitis of elbow     left  . Osteoarthritis   . HTN (hypertension)   . HLD (hyperlipidemia)   . History of DVT (deep vein thrombosis)   . Chronic systolic heart failure     echo 4/13: EF 25-30%, mod MR, mod LAE, PASP 47  . NICM (nonischemic cardiomyopathy)     normal cors by cath in 1990s;  nuclear 8/08: no ischemia; inf/lat/dist ant/apical scar  . Moderate mitral regurgitation     echo 4/13  . Ventricular tachycardia 07/2014  . Diabetes mellitus without complication    Past Surgical History  Procedure Laterality Date  . Transurethral resection of prostate    . Cardiac defibrillator placement  10/09    STJ single chamber ICD   . Hernia repair    . Robot assisted laparoscopic radical prostatectomy  6/08  . Left and right heart catheterization with coronary  angiogram N/A 08/13/2014    Procedure: LEFT AND RIGHT HEART CATHETERIZATION WITH CORONARY ANGIOGRAM;  Surgeon: Sinclair Grooms, MD;  Location: [facility] C[facility] LAB;  Service: Cardiovascular;  Laterality: N/A;   Family History  Problem Relation Age of Onset  . Cancer Father     prostate  . Heart disease Mother    History  Substance Use Topics  . Smoking status: Former Smoker -- 0.50 packs/day for 10 years    Types: Cigarettes    Quit date: 11/01/1975  . Smokeless tobacco: Never Used     Comment: quit over 30 years ago   . Alcohol Use: No     Comment: occasionally    Review of Systems  All other systems reviewed and are negative.     Allergies  Spironolactone  Home Medications   Prior to Admission medications   Medication Sig Start Date End Date Taking? Authorizing Provider  acetaminophen (TYLENOL) 325 MG tablet Take 2 tablets (650 mg total) by mouth every 4 (four) hours as needed for headache or mild pain. 08/16/14  Yes Erlene Quan, PA-C  amiodarone (PACERONE) 200 MG tablet Take 1 tablet (200 mg total) by mouth 2 (two) times daily. Patient taking differently: Take 400 mg by mouth 2 (two) times daily.  08/24/14 09/20/14 Yes Lelon Perla, MD  aspirin 81 MG tablet Take 81 mg by mouth daily.     Yes Historical Provider, MD  carvedilol (COREG) 25 MG tablet Take  25 mg by mouth 2 (two) times daily with a meal.     Yes Historical Provider, MD  Cholecalciferol (VITAMIN D-3) 1000 UNITS CAPS Take 1,000 Units by mouth daily.   Yes Historical Provider, MD  COLCRYS 0.6 MG tablet Take 0.6 mg by mouth 2 (two) times daily as needed (for gout symptoms).  03/11/14  Yes Historical Provider, MD  digoxin (LANOXIN) 0.125 MG tablet Take 125 mcg by mouth daily.     Yes Historical Provider, MD  furosemide (LASIX) 40 MG tablet Take 40 mg by mouth daily. Take daily as directed 09/14/11  Yes Thompson Grayer, MD  HYDROcodone-acetaminophen (NORCO) 10-325 MG per tablet Take 1 tablet by mouth every 8 (eight) hours as  needed. Patient taking differently: Take 1 tablet by mouth every 8 (eight) hours as needed for moderate pain.  08/21/14  Yes Janith Lima, MD  Linaclotide [facility] 145 MCG CAPS capsule Take 1 capsule (145 mcg total) by mouth daily. Patient taking differently: Take 145 mcg by mouth daily as needed (constipation).  07/28/14  Yes Janith Lima, MD  lisinopril (PRINIVIL,ZESTRIL) 5 MG tablet Take 10 mg by mouth daily.    Yes Historical Provider, MD  Methotrexate Sodium, PF, 50 MG/2ML SOLN Inject 0.8 mLs as directed once a week. Take on sundays   Yes Historical Provider, MD  simvastatin (ZOCOR) 20 MG tablet Take 20 mg by mouth daily.   Yes Historical Provider, MD  TRADJENTA 5 MG TABS tablet TAKE 1 TABLET BY MOUTH DAILY FOR DIABETES 08/24/14  Yes Janith Lima, MD  ULORIC 80 MG TABS TAKE 1 TABLET (80 MG TOTAL) BY MOUTH DAILY. Patient taking differently: TAKE 2 TABLETS IN THE MORNING AND 1 TABLET IN THE EVENING. 04/14/14  Yes Janith Lima, MD  XARELTO STARTER PACK 15 & 20 MG TBPK Take 15-20 mg by mouth as directed. Take as directed on package: Start with one 15mg  tablet by mouth twice a day with food. On Day 22, switch to one 20mg  tablet once a day with food. 08/18/14  Yes Evelina Bucy, MD  Febuxostat 80 MG TABS Take 40-80 mg by mouth 2 (two) times daily. Take 1 tablet (80mg ) in the morning and take 1/2 tablet (40mg ) in the evening    Historical Provider, MD  levofloxacin (LEVAQUIN) 750 MG tablet Take 1 tablet (750 mg total) by mouth daily. 08/30/14   Noemi Chapel, MD   BP 112/60 mmHg  Pulse 70  Temp(Src) 98.3 F (36.8 C) (Oral)  Resp 19  Ht 5\' 9"  (1.753 m)  Wt 197 lb (89.359 kg)  BMI 29.08 kg/m2  SpO2 100% Physical Exam  Constitutional: He appears well-developed and well-nourished. No distress.  HENT:  Head: Normocephalic and atraumatic.  Mouth/Throat: Oropharynx is clear and moist. No oropharyngeal exudate.  Eyes: Conjunctivae and EOM are normal. Pupils are equal, round, and reactive to light.  Right eye exhibits no discharge. Left eye exhibits no discharge. No scleral icterus.  Neck: Normal range of motion. Neck supple. No JVD present. No thyromegaly present.  Cardiovascular: Normal rate, regular rhythm, normal heart sounds and intact distal pulses.  Exam reveals no gallop and no friction rub.   No murmur heard. Pulmonary/Chest: Effort normal. No respiratory distress. He has no wheezes. He has rales (rales at the right base).  Abdominal: Soft. Bowel sounds are normal. He exhibits no distension and no mass. There is no tenderness.  Musculoskeletal: Normal range of motion. He exhibits no edema or tenderness.  Lymphadenopathy:  He has no cervical adenopathy.  Neurological: He is alert. Coordination normal.  Skin: Skin is warm and dry. No rash noted. No erythema.  Psychiatric: He has a normal mood and affect. His behavior is normal.  Nursing note and vitals reviewed.   ED Course  Procedures (including critical care time) Labs Review Labs Reviewed  CBC - Abnormal; Notable for the following:    RBC 3.89 (*)    Hemoglobin 10.4 (*)    HCT 32.2 (*)    RDW 16.5 (*)    All other components within normal limits  PROTIME-INR - Abnormal; Notable for the following:    Prothrombin Time 25.6 (*)    INR 2.31 (*)    All other components within normal limits  APTT - Abnormal; Notable for the following:    aPTT 47 (*)    All other components within normal limits  BRAIN NATRIURETIC PEPTIDE - Abnormal; Notable for the following:    B Natriuretic Peptide 220.8 (*)    All other components within normal limits  I-STAT CHEM 8, ED - Abnormal; Notable for the following:    Glucose, Bld 156 (*)    Hemoglobin 12.2 (*)    HCT 36.0 (*)    All other components within normal limits  DIGOXIN LEVEL  TYPE AND SCREEN  ABO/RH    Imaging Review Dg Chest 2 View  08/30/2014   CLINICAL DATA:  Dry cough, shortness of breath, hemoptysis  EXAM: CHEST  2 VIEW  COMPARISON:  08/23/2014  FINDINGS: There is  bilateral interstitial thickening. There is no pleural effusion or pneumothorax. There is stable cardiomegaly. There is a single lead cardiac pacer.  The osseous structures are unremarkable.  IMPRESSION: 1. Bilateral interstitial thickening concerning for an infectious etiology including atypical infections.   Electronically Signed   By: Kathreen Devoid   On: 08/30/2014 13:07   Ct Angio Chest Pe W/cm &/or Wo Cm  08/30/2014   CLINICAL DATA:  Cough and congestion 5 days.  EXAM: CT ANGIOGRAPHY CHEST WITH CONTRAST  TECHNIQUE: Multidetector CT imaging of the chest was performed using the standard protocol during bolus administration of intravenous contrast. Multiplanar CT image reconstructions and MIPs were obtained to evaluate the vascular anatomy.  CONTRAST:  132mL OMNIPAQUE IOHEXOL 350 MG/ML SOLN  COMPARISON:  Chest x-ray 08/30/2014  FINDINGS: Left-sided pacemaker is present. Lungs are adequately inflated and demonstrate a moderate airspace process involving the right lower lobe and minimally over the left lower lobe likely pneumonia. No evidence of effusion. Airways are within normal.  Mild cardiomegaly. 1 cm lymph node adjacent the distal esophagus. There is no evidence of pulmonary embolism. No evidence of mediastinal or axillary adenopathy.  Images through the upper abdomen demonstrate a few small lymph nodes in the region the gastrohepatic ligament and porta hepatis. There is a 1.3 cm right adrenal adenoma. The remaining bones and soft tissues are within normal.  Review of the MIP images confirms the above findings.  IMPRESSION: No evidence of pulmonary embolism.  Moderate patchy bilateral airspace process most prominent over the right lower lobe compatible with a pneumonia. No evidence of effusion.  Mild cardiomegaly.  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   Electronically Signed   By: Marin Olp M.D.   On: 08/30/2014 14:05     EKG Interpretation   Date/Time:  Sunday August 30 2014 11:44:10 EDT Ventricular  Rate:  79 PR Interval:  208 QRS Duration: 132 QT Interval:  396 QTC Calculation: 454 R Axis:   -108  Text Interpretation:  Sinus rhythm Nonspecific IVCD with LAD since last  tracing no significant change Abnormal ekg Confirmed by Sabra Heck  MD, Ashari Llewellyn  [phone]) on 08/30/2014 12:06:54 PM      MDM   Final diagnoses:  Cough  CAP (community acquired pneumonia)    The patient is very well-appearing, vital signs are totally normal, his bilateral arms are symmetrical without any edema, normal pulses and capillary refill, lungs are clear except for the right base, he does have a holosystolic murmur consistent with his history of mitral regurgitation. He is likely having some hemoptysis from Xarelto and his cough, this could also be related to pulmonary embolism however, will need evaluation with chest x-ray labs, consider CT imaging on reevaluation.  X-ray with some abnormal findings suggesting infection, with history of DVT a CT scan was ordered, no signs of PE, he does have confirmed pneumonia, vital signs remained stable, oxygen 100% ambulating, pulse no higher than 80, no fever, stable for discharge, patient informed of the treatment plan, medications given prior to discharge to start at abx therapy intravenously.  Meds given in ED:  Medications  levofloxacin (LEVAQUIN) IVPB 750 mg (750 mg Intravenous New Bag/Given 08/30/14 1512)  iohexol (OMNIPAQUE) 350 MG/ML injection 100 mL (100 mLs Intravenous Contrast Given 08/30/14 1344)    New Prescriptions   LEVOFLOXACIN (LEVAQUIN) 750 MG TABLET    Take 1 tablet (750 mg total) by mouth daily.       Noemi Chapel, MD 08/30/14 [phone]

## 2014-08-30 NOTE — ED Notes (Signed)
Patient returned from CT

## 2014-08-30 NOTE — ED Notes (Signed)
Congestion in the chest for 5 days. This morning when coughing, noted bloody sputum.  Started on Xarelto on March 1st.

## 2014-08-30 NOTE — ED Notes (Signed)
Patient transported to X-ray 

## 2014-08-30 NOTE — ED Notes (Signed)
Patient transported to CT 

## 2014-08-31 ENCOUNTER — Encounter: Payer: Medicare Other | Admitting: Physician Assistant

## 2014-09-02 ENCOUNTER — Inpatient Hospital Stay (HOSPITAL_COMMUNITY)
Admission: EM | Admit: 2014-09-02 | Discharge: 2014-09-07 | DRG: 194 | Disposition: A | Discharge status: Home / Self Care | Payer: Medicare Other | Attending: Internal Medicine | Admitting: Internal Medicine

## 2014-09-02 ENCOUNTER — Encounter (HOSPITAL_COMMUNITY): Payer: Self-pay | Admitting: Emergency Medicine

## 2014-09-02 ENCOUNTER — Encounter: Payer: Self-pay | Admitting: Internal Medicine

## 2014-09-02 ENCOUNTER — Ambulatory Visit (INDEPENDENT_AMBULATORY_CARE_PROVIDER_SITE_OTHER)
Admission: RE | Admit: 2014-09-02 | Discharge: 2014-09-02 | Disposition: A | Discharge status: Home / Self Care | Payer: Medicare Other | Source: Ambulatory Visit | Attending: Internal Medicine | Admitting: Internal Medicine

## 2014-09-02 ENCOUNTER — Ambulatory Visit (INDEPENDENT_AMBULATORY_CARE_PROVIDER_SITE_OTHER): Payer: Medicare Other | Admitting: Internal Medicine

## 2014-09-02 ENCOUNTER — Other Ambulatory Visit (INDEPENDENT_AMBULATORY_CARE_PROVIDER_SITE_OTHER): Payer: Medicare Other

## 2014-09-02 ENCOUNTER — Emergency Department (HOSPITAL_COMMUNITY): Payer: Medicare Other

## 2014-09-02 VITALS — BP 128/74 | HR 79 | Temp 98.0°F | Resp 16 | Wt 199.0 lb

## 2014-09-02 DIAGNOSIS — N183 Chronic kidney disease, stage 3 unspecified: Secondary | ICD-10-CM | POA: Diagnosis present

## 2014-09-02 DIAGNOSIS — Z8249 Family history of ischemic heart disease and other diseases of the circulatory system: Secondary | ICD-10-CM

## 2014-09-02 DIAGNOSIS — I82A12 Acute embolism and thrombosis of left axillary vein: Secondary | ICD-10-CM

## 2014-09-02 DIAGNOSIS — Z8042 Family history of malignant neoplasm of prostate: Secondary | ICD-10-CM

## 2014-09-02 DIAGNOSIS — E1122 Type 2 diabetes mellitus with diabetic chronic kidney disease: Secondary | ICD-10-CM | POA: Diagnosis present

## 2014-09-02 DIAGNOSIS — E785 Hyperlipidemia, unspecified: Secondary | ICD-10-CM

## 2014-09-02 DIAGNOSIS — E1129 Type 2 diabetes mellitus with other diabetic kidney complication: Secondary | ICD-10-CM | POA: Diagnosis present

## 2014-09-02 DIAGNOSIS — J849 Interstitial pulmonary disease, unspecified: Secondary | ICD-10-CM | POA: Insufficient documentation

## 2014-09-02 DIAGNOSIS — I129 Hypertensive chronic kidney disease with stage 1 through stage 4 chronic kidney disease, or unspecified chronic kidney disease: Secondary | ICD-10-CM | POA: Diagnosis present

## 2014-09-02 DIAGNOSIS — M1990 Unspecified osteoarthritis, unspecified site: Secondary | ICD-10-CM | POA: Diagnosis present

## 2014-09-02 DIAGNOSIS — R042 Hemoptysis: Secondary | ICD-10-CM | POA: Diagnosis not present

## 2014-09-02 DIAGNOSIS — I808 Phlebitis and thrombophlebitis of other sites: Secondary | ICD-10-CM | POA: Diagnosis not present

## 2014-09-02 DIAGNOSIS — R791 Abnormal coagulation profile: Secondary | ICD-10-CM

## 2014-09-02 DIAGNOSIS — I472 Ventricular tachycardia: Secondary | ICD-10-CM | POA: Diagnosis present

## 2014-09-02 DIAGNOSIS — J189 Pneumonia, unspecified organism: Principal | ICD-10-CM

## 2014-09-02 DIAGNOSIS — I420 Dilated cardiomyopathy: Secondary | ICD-10-CM | POA: Diagnosis present

## 2014-09-02 DIAGNOSIS — Z8546 Personal history of malignant neoplasm of prostate: Secondary | ICD-10-CM

## 2014-09-02 DIAGNOSIS — Z7901 Long term (current) use of anticoagulants: Secondary | ICD-10-CM

## 2014-09-02 DIAGNOSIS — Z79891 Long term (current) use of opiate analgesic: Secondary | ICD-10-CM

## 2014-09-02 DIAGNOSIS — Z66 Do not resuscitate: Secondary | ICD-10-CM | POA: Diagnosis present

## 2014-09-02 DIAGNOSIS — M109 Gout, unspecified: Secondary | ICD-10-CM | POA: Diagnosis present

## 2014-09-02 DIAGNOSIS — I5022 Chronic systolic (congestive) heart failure: Secondary | ICD-10-CM | POA: Diagnosis present

## 2014-09-02 DIAGNOSIS — Z87891 Personal history of nicotine dependence: Secondary | ICD-10-CM

## 2014-09-02 DIAGNOSIS — I4729 Other ventricular tachycardia: Secondary | ICD-10-CM

## 2014-09-02 DIAGNOSIS — Z7982 Long term (current) use of aspirin: Secondary | ICD-10-CM

## 2014-09-02 DIAGNOSIS — I340 Nonrheumatic mitral (valve) insufficiency: Secondary | ICD-10-CM | POA: Diagnosis present

## 2014-09-02 DIAGNOSIS — Z79899 Other long term (current) drug therapy: Secondary | ICD-10-CM

## 2014-09-02 DIAGNOSIS — I82622 Acute embolism and thrombosis of deep veins of left upper extremity: Secondary | ICD-10-CM | POA: Diagnosis present

## 2014-09-02 DIAGNOSIS — Z888 Allergy status to other drugs, medicaments and biological substances status: Secondary | ICD-10-CM

## 2014-09-02 DIAGNOSIS — D649 Anemia, unspecified: Secondary | ICD-10-CM

## 2014-09-02 LAB — CBC WITH DIFFERENTIAL/PLATELET
BASOS PCT: 1 % (ref 0.0–3.0)
Basophils Absolute: 0.1 10*3/uL (ref 0.0–0.1)
Basophils Absolute: 0 10*3/uL (ref 0.0–0.1)
Basophils Relative: 0 % (ref 0–1)
EOS PCT: 0.6 % (ref 0.0–5.0)
EOS PCT: 1 % (ref 0–5)
Eosinophils Absolute: 0.1 10*3/uL (ref 0.0–0.7)
Eosinophils Absolute: 0.1 10*3/uL (ref 0.0–0.7)
HEMATOCRIT: 33.1 % — AB (ref 39.0–52.0)
HEMATOCRIT: 34.7 % — AB (ref 39.0–52.0)
HEMOGLOBIN: 10.8 g/dL — AB (ref 13.0–17.0)
HEMOGLOBIN: 10.7 g/dL — AB (ref 13.0–17.0)
LYMPHS ABS: 1.3 10*3/uL (ref 0.7–4.0)
LYMPHS PCT: 14 % (ref 12–46)
Lymphocytes Relative: 14.6 % (ref 12.0–46.0)
Lymphs Abs: 1.2 10*3/uL (ref 0.7–4.0)
MCH: 26.1 pg (ref 26.0–34.0)
MCHC: 32.5 g/dL (ref 30.0–36.0)
MCHC: 30.8 g/dL (ref 30.0–36.0)
MCV: 82.8 fl (ref 78.0–100.0)
MCV: 84.6 fL (ref 78.0–100.0)
Monocytes Absolute: 1 10*3/uL (ref 0.1–1.0)
Monocytes Absolute: 1.1 10*3/uL — ABNORMAL HIGH (ref 0.1–1.0)
Monocytes Relative: 11.7 % (ref 3.0–12.0)
Monocytes Relative: 12 % (ref 3–12)
Neutro Abs: 6.1 10*3/uL (ref 1.4–7.7)
Neutro Abs: 6.4 10*3/uL (ref 1.7–7.7)
Neutrophils Relative %: 72.1 % (ref 43.0–77.0)
Neutrophils Relative %: 73 % (ref 43–77)
Platelets: 266 10*3/uL (ref 150.0–400.0)
Platelets: 245 10*3/uL (ref 150–400)
RBC: 3.99 Mil/uL — ABNORMAL LOW (ref 4.22–5.81)
RBC: 4.1 MIL/uL — ABNORMAL LOW (ref 4.22–5.81)
RDW: 17.5 % — ABNORMAL HIGH (ref 11.5–15.5)
RDW: 16.7 % — ABNORMAL HIGH (ref 11.5–15.5)
WBC: 8.5 10*3/uL (ref 4.0–10.5)
WBC: 8.8 10*3/uL (ref 4.0–10.5)

## 2014-09-02 LAB — SAMPLE TO BLOOD BANK

## 2014-09-02 LAB — BASIC METABOLIC PANEL
Anion gap: 10 (ref 5–15)
BUN: 18 mg/dL (ref 6–23)
BUN: 19 mg/dL (ref 6–23)
CALCIUM: 8.8 mg/dL (ref 8.4–10.5)
CO2: 31 mEq/L (ref 19–32)
CO2: 24 mmol/L (ref 19–32)
Calcium: 8.9 mg/dL (ref 8.4–10.5)
Chloride: 106 mEq/L (ref 96–112)
Chloride: 108 mmol/L (ref 96–112)
Creatinine, Ser: 1.42 mg/dL (ref 0.40–1.50)
Creatinine, Ser: 1.3 mg/dL (ref 0.50–1.35)
GFR calc Af Amer: 62 mL/min — ABNORMAL LOW (ref >90)
GFR calc non Af Amer: 53 mL/min — ABNORMAL LOW (ref >90)
GFR: 62.86 mL/min (ref >60.00)
GLUCOSE: 99 mg/dL (ref 70–99)
Glucose, Bld: 96 mg/dL (ref 70–99)
POTASSIUM: 4.5 meq/L (ref 3.5–5.1)
POTASSIUM: 3.9 mmol/L (ref 3.5–5.1)
SODIUM: 139 meq/L (ref 135–145)
Sodium: 142 mmol/L (ref 135–145)

## 2014-09-02 LAB — HEPATIC FUNCTION PANEL
ALBUMIN: 3.6 g/dL (ref 3.5–5.2)
ALT: 37 U/L (ref 0–53)
AST: 25 U/L (ref 0–37)
Alkaline Phosphatase: 50 U/L (ref 39–117)
Bilirubin, Direct: <0.1 mg/dL (ref 0.0–0.5)
Total Bilirubin: 1 mg/dL (ref 0.3–1.2)
Total Protein: 6.9 g/dL (ref 6.0–8.3)

## 2014-09-02 LAB — PROTIME-INR
INR: 3.2 ratio — AB (ref 0.8–1.0)
INR: 3.08 — ABNORMAL HIGH (ref 0.00–1.49)
PROTHROMBIN TIME: 34.9 s — AB (ref 9.6–13.1)
Prothrombin Time: 32.1 seconds — ABNORMAL HIGH (ref 11.6–15.2)

## 2014-09-02 LAB — I-STAT TROPONIN, ED: TROPONIN I, POC: 0.01 ng/mL (ref 0.00–0.08)

## 2014-09-02 LAB — I-STAT CG4 LACTIC ACID, ED: Lactic Acid, Venous: 1.22 mmol/L (ref 0.5–2.0)

## 2014-09-02 MED ORDER — DEXTROSE 5 % IV SOLN
1.0000 g | Freq: Once | INTRAVENOUS | Status: AC
  Administered 2014-09-02: 1 g via INTRAVENOUS
  Filled 2014-09-02: qty 10

## 2014-09-02 MED ORDER — DEXTROSE 5 % IV SOLN
500.0000 mg | Freq: Once | INTRAVENOUS | Status: AC
  Administered 2014-09-02: 500 mg via INTRAVENOUS
  Filled 2014-09-02: qty 500

## 2014-09-02 MED ORDER — RIVAROXABAN 15 MG PO TABS: 15.0000 mg | ORAL_TABLET | Freq: Every day | ORAL | Status: DC

## 2014-09-02 NOTE — ED Provider Notes (Signed)
CSN: [id number]     Arrival date & time 09/02/14  1520 History   First MD Initiated Contact with Patient 09/02/14 1606     Chief Complaint  Patient presents with  . Cough  . Shortness of Breath  . Pneumonia      HPI  Pt was seen at 1605.  Per pt, c/o gradual onset and worsening of persistent cough and hemoptysis for the past 1 week. Has been associated with mild SOB. Pt was evaluated in the ED 3 days ago for same, dx pneumonia, rx levaquin. Pt states he was evaluated by his PMD today, had labs and a CXR completed in the office, and then was sent to the ED for further evaluation and admission for "low Hgb" and "my pneumonia is getting worse." Denies any other bleeding. Denies hematuria, no blood in stools, no epistaxis, no CP/palpitations, no abd pain, no N/V/D, no back pain.      Past Medical History  Diagnosis Date  . Obesity   . Prostate cancer   . Glucose intolerance (impaired glucose tolerance)   . Bursitis of elbow     left  . Osteoarthritis   . HTN (hypertension)   . HLD (hyperlipidemia)   . History of DVT (deep vein thrombosis)   . Chronic systolic heart failure     echo 4/13: EF 25-30%, mod MR, mod LAE, PASP 47  . NICM (nonischemic cardiomyopathy)     normal cors by cath in 1990s;  nuclear 8/08: no ischemia; inf/lat/dist ant/apical scar  . Moderate mitral regurgitation     echo 4/13  . Ventricular tachycardia 07/2014  . Diabetes mellitus without complication    Past Surgical History  Procedure Laterality Date  . Transurethral resection of prostate    . Cardiac defibrillator placement  10/09    STJ single chamber ICD   . Hernia repair    . Robot assisted laparoscopic radical prostatectomy  6/08  . Left and right heart catheterization with coronary angiogram N/A 08/13/2014    Procedure: LEFT AND RIGHT HEART CATHETERIZATION WITH CORONARY ANGIOGRAM;  Surgeon: Sinclair Grooms, MD;  Location: [facility] CATH LAB;  Service: Cardiovascular;  Laterality: N/A;   Family History   Problem Relation Age of Onset  . Cancer Father     prostate  . Heart disease Mother    History  Substance Use Topics  . Smoking status: Former Smoker -- 0.50 packs/day for 10 years    Types: Cigarettes    Quit date: 11/01/1975  . Smokeless tobacco: Never Used     Comment: quit over 30 years ago   . Alcohol Use: No     Comment: occasionally    Review of Systems ROS: Statement: All systems negative except as marked or noted in the HPI; Constitutional: Negative for fever and chills. ; ; Eyes: Negative for eye pain, redness and discharge. ; ; ENMT: Negative for ear pain, hoarseness, nasal congestion, sinus pressure and sore throat. ; ; Cardiovascular: Negative for chest pain, palpitations, diaphoresis, and peripheral edema. ; ; Respiratory: +cough, hemoptysis, mild SOB.  Negative for wheezing and stridor. ; ; Gastrointestinal: Negative for nausea, vomiting, diarrhea, abdominal pain, blood in stool, hematemesis, jaundice and rectal bleeding. . ; ; Genitourinary: Negative for dysuria, flank pain and hematuria. ; ; Musculoskeletal: Negative for back pain and neck pain. Negative for swelling and trauma.; ; Skin: Negative for pruritus, rash, abrasions, blisters, bruising and skin lesion.; ; Neuro: Negative for headache, lightheadedness and neck stiffness. Negative for weakness,  altered level of consciousness , altered mental status, extremity weakness, paresthesias, involuntary movement, seizure and syncope.      Allergies  Spironolactone  Home Medications   Prior to Admission medications   Medication Sig Start Date End Date Taking? Authorizing Provider  acetaminophen (TYLENOL) 325 MG tablet Take 2 tablets (650 mg total) by mouth every 4 (four) hours as needed for headache or mild pain. 08/16/14  Yes Erlene Quan, PA-C  amiodarone (PACERONE) 200 MG tablet Take 1 tablet (200 mg total) by mouth 2 (two) times daily. Patient taking differently: Take 400 mg by mouth 2 (two) times daily.  08/24/14  09/20/14 Yes Lelon Perla, MD  aspirin 81 MG tablet Take 81 mg by mouth daily.     Yes Historical Provider, MD  carvedilol (COREG) 25 MG tablet Take 25 mg by mouth 2 (two) times daily with a meal.     Yes Historical Provider, MD  Cholecalciferol (VITAMIN D-3) 1000 UNITS CAPS Take 1,000 Units by mouth daily.   Yes Historical Provider, MD  COLCRYS 0.6 MG tablet Take 0.6 mg by mouth 2 (two) times daily as needed (for gout symptoms).  03/11/14  Yes Historical Provider, MD  digoxin (LANOXIN) 0.125 MG tablet Take 125 mcg by mouth daily.     Yes Historical Provider, MD  furosemide (LASIX) 40 MG tablet Take 40 mg by mouth daily. Take daily as directed 09/14/11  Yes Thompson Grayer, MD  HYDROcodone-acetaminophen (NORCO) 10-325 MG per tablet Take 1 tablet by mouth every 8 (eight) hours as needed. Patient taking differently: Take 1 tablet by mouth every 8 (eight) hours as needed for moderate pain.  08/21/14  Yes Janith Lima, MD  levofloxacin (LEVAQUIN) 750 MG tablet Take 1 tablet (750 mg total) by mouth daily. 08/30/14  Yes Noemi Chapel, MD  Linaclotide [facility]) 145 MCG CAPS capsule Take 1 capsule (145 mcg total) by mouth daily. Patient taking differently: Take 145 mcg by mouth daily as needed (constipation).  07/28/14  Yes Janith Lima, MD  lisinopril (PRINIVIL,ZESTRIL) 5 MG tablet Take 5 mg by mouth daily.    Yes Historical Provider, MD  Methotrexate Sodium, PF, 50 MG/2ML SOLN Inject 0.8 mLs as directed once a week. Take on sundays   Yes Historical Provider, MD  Rivaroxaban (XARELTO) 15 MG TABS tablet Take 1 tablet (15 mg total) by mouth daily with supper. 09/02/14  Yes Janith Lima, MD  simvastatin (ZOCOR) 20 MG tablet Take 10 mg by mouth daily.    Yes Historical Provider, MD  TRADJENTA 5 MG TABS tablet TAKE 1 TABLET BY MOUTH DAILY FOR DIABETES 08/24/14  Yes Janith Lima, MD  ULORIC 80 MG TABS TAKE 1 TABLET (80 MG TOTAL) BY MOUTH DAILY. Patient taking differently: TAKE 2 TABLETS IN THE MORNING AND 1 TABLET  IN THE EVENING. 04/14/14  Yes Janith Lima, MD   BP 153/77 mmHg  Pulse 81  Temp(Src) 97.7 F (36.5 C) (Oral)  Resp 18  SpO2 99% Physical Exam  1610: Physical examination:  Nursing notes reviewed; Vital signs and O2 SAT reviewed;  Constitutional: Well developed, Well nourished, Well hydrated, In no acute distress; Head:  Normocephalic, atraumatic; Eyes: EOMI, PERRL, No scleral icterus; ENMT: TM's clear bilat. No epistaxis. No blood in mouth or post pharynx. Mouth and pharynx normal, Mucous membranes moist; Neck: Supple, Full range of motion, No lymphadenopathy; Cardiovascular: Regular rate and rhythm, No gallop; Respiratory: Breath sounds coarse & equal bilaterally, No wheezes.  Speaking full sentences with  ease, Normal respiratory effort/excursion; Chest: Nontender, Movement normal; Abdomen: Soft, Nontender, Nondistended, Normal bowel sounds; Genitourinary: No CVA tenderness; Extremities: Pulses normal, No tenderness, No edema, No calf edema or asymmetry.; Neuro: AA&Ox3, Major CN grossly intact.  Speech clear. No gross focal motor or sensory deficits in extremities. Climbs on and off stretcher easily by himself. Gait steady.; Skin: Color normal, Warm, Dry.   ED Course  Procedures     EKG Interpretation   Date/Time:  Wednesday September 02 2014 15:31:58 EDT Ventricular Rate:  70 PR Interval:  195 QRS Duration: 116 QT Interval:  403 QTC Calculation: 435 R Axis:   34 Text Interpretation:  Sinus rhythm Nonspecific intraventricular conduction  delay Low voltage, extremity leads Nonspecific T abnormalities, lateral  leads Nonspecific ST and T wave abnormality Anterior leads When compared  with ECG of 08/30/2014 No significant change was found Confirmed by  [facility]  MD, Nunzio Cory [phone]) on 09/02/2014 4:41:00 PM      MDM  MDM Reviewed: previous chart, nursing note and vitals Reviewed previous: labs, ECG and CT scan Interpretation: labs, ECG and x-ray    Ct Angio Chest Pe W/cm &/or Wo  Cm 08/30/2014   CLINICAL DATA:  Cough and congestion 5 days.  EXAM: CT ANGIOGRAPHY CHEST WITH CONTRAST  TECHNIQUE: Multidetector CT imaging of the chest was performed using the standard protocol during bolus administration of intravenous contrast. Multiplanar CT image reconstructions and MIPs were obtained to evaluate the vascular anatomy.  CONTRAST:  130mL OMNIPAQUE IOHEXOL 350 MG/ML SOLN  COMPARISON:  Chest x-ray 08/30/2014  FINDINGS: Left-sided pacemaker is present. Lungs are adequately inflated and demonstrate a moderate airspace process involving the right lower lobe and minimally over the left lower lobe likely pneumonia. No evidence of effusion. Airways are within normal.  Mild cardiomegaly. 1 cm lymph node adjacent the distal esophagus. There is no evidence of pulmonary embolism. No evidence of mediastinal or axillary adenopathy.  Images through the upper abdomen demonstrate a few small lymph nodes in the region the gastrohepatic ligament and porta hepatis. There is a 1.3 cm right adrenal adenoma. The remaining bones and soft tissues are within normal.  Review of the MIP images confirms the above findings.  IMPRESSION: No evidence of pulmonary embolism.  Moderate patchy bilateral airspace process most prominent over the right lower lobe compatible with a pneumonia. No evidence of effusion.  Mild cardiomegaly.  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   Electronically Signed   By: Marin Olp M.D.   On: 08/30/2014 14:05   Dg Chest 2 View 09/02/2014   CLINICAL DATA:  Hemoptysis.  EXAM: CHEST  2 VIEW  COMPARISON:  August 30, 2014.  FINDINGS: The heart size and mediastinal contours are within normal limits. Left lung is clear. Possible mild right infrahilar opacity is noted which may represent focal inflammation. No pneumothorax or pleural effusion is noted. Left-sided pacemaker is unchanged in position. The visualized skeletal structures are unremarkable.  IMPRESSION: Possible mild right infrahilar opacity concerning  for pneumonia. Followup radiographs are recommended.   Electronically Signed   By: Marijo Conception, M.D.   On: 09/02/2014 13:59    Results for orders placed or performed during the hospital encounter of 09/02/14  CBC with Differential  Result Value Ref Range   WBC 8.8 4.0 - 10.5 K/uL   RBC 4.10 (L) 4.22 - 5.81 MIL/uL   Hemoglobin 10.7 (L) 13.0 - 17.0 g/dL   HCT 34.7 (L) 39.0 - 52.0 %   MCV 84.6 78.0 - 100.0  fL   MCH 26.1 26.0 - 34.0 pg   MCHC 30.8 30.0 - 36.0 g/dL   RDW 16.7 (H) 11.5 - 15.5 %   Platelets 245 150 - 400 K/uL   Neutrophils Relative % 73 43 - 77 %   Neutro Abs 6.4 1.7 - 7.7 K/uL   Lymphocytes Relative 14 12 - 46 %   Lymphs Abs 1.3 0.7 - 4.0 K/uL   Monocytes Relative 12 3 - 12 %   Monocytes Absolute 1.1 (H) 0.1 - 1.0 K/uL   Eosinophils Relative 1 0 - 5 %   Eosinophils Absolute 0.1 0.0 - 0.7 K/uL   Basophils Relative 0 0 - 1 %   Basophils Absolute 0.0 0.0 - 0.1 K/uL  Basic metabolic panel  Result Value Ref Range   Sodium 142 135 - 145 mmol/L   Potassium 3.9 3.5 - 5.1 mmol/L   Chloride 108 96 - 112 mmol/L   CO2 24 19 - 32 mmol/L   Glucose, Bld 99 70 - 99 mg/dL   BUN 19 6 - 23 mg/dL   Creatinine, Ser 1.30 0.50 - 1.35 mg/dL   Calcium 8.8 8.4 - 10.5 mg/dL   GFR calc non Af Amer 53 (L) >90 mL/min   GFR calc Af Amer 62 (L) >90 mL/min   Anion gap 10 5 - 15  I-Stat CG4 Lactic Acid, ED  Result Value Ref Range   Lactic Acid, Venous 1.22 0.5 - 2.0 mmol/L  I-stat troponin, ED  Result Value Ref Range   Troponin i, poc 0.01 0.00 - 0.08 ng/mL   Comment 3           Results for Gary, [name] (MRN [id number]) as of 09/02/2014 17:45  Ref. Range 08/13/2014 08:00 08/30/2014 12:07 09/02/2014 14:14  Prothrombin Time Latest Range: 9.6-13.1 sec 14.2 25.6 (H) 34.9 (H)  INR Latest Range: 0.8-1.0 ratio 1.09 2.31 (H) 3.2 (H)     Results for Gary, [name] (MRN [id number]) as of 09/02/2014 16:46  Ref. Range 08/14/2014 09:34 08/18/2014 03:47 08/23/2014 05:39 08/30/2014 12:25 09/02/2014 15:52   Hemoglobin Latest Range: 13.0-17.0 g/dL 11.5 (L) 11.1 (L) 11.2 (L) 12.2 (L) 10.7 (L)  HCT Latest Range: 39.0-52.0 % 35.1 (L) 34.2 (L) 33.7 (L) 36.0 (L) 34.7 (L)     1800:  INR elevated; pt denies taking any other anticoagulants other than xarelto. H/H decreased from 2 days ago.  IV abx for CAP started. Dx and testing d/w pt.  Questions answered.  Verb understanding, agreeable to admit.  T/C to [facility]. Wendee Beavers, case discussed, including:  HPI, pertinent PM/SHx, VS/PE, dx testing, ED course and treatment:  Agreeable to admit, requests he will come to the ED for evaluation.   Francine Graven, DO 09/04/14 1414

## 2014-09-02 NOTE — ED Notes (Signed)
Patient requested to speak to md.

## 2014-09-02 NOTE — Assessment & Plan Note (Signed)
This is worsening His INR is rising without explanation (xarelto) does not usually do this He has developed an anemia and is symptomatic from this His CXR is not improving I have asked him to return to the ER for further evaluation

## 2014-09-02 NOTE — ED Notes (Addendum)
Pt was seen 2 weeks ago -had xrays completed and was told he had pneumonia. Given an antibiotic (doesn't recall name)-still has four more days and has taken as prescribed. Says he feels slightly SOB but denies chest pain/dizziness/N/V/D/night sweats. Speaking full/clear sentences. RR even/unlabored. In NAD. Moving all extremities. Ambulatory. Sent by MD Ronnald Ramp because "my pneumonia isn't getting any better, and something about how I might be anemic."

## 2014-09-02 NOTE — Progress Notes (Signed)
   Subjective:    Patient ID: [name], male    DOB: Oct 09, 1940, 74 y.o.   MRN: [id number]  HPI Comments: Hemoptysis continues, he brings up quite a bit of blood with his cough.  Cough This is a recurrent problem. The current episode started in the past 7 days. The problem has been unchanged. The cough is productive of bloody sputum. Associated symptoms include shortness of breath. Pertinent negatives include no chest pain, chills, ear congestion, ear pain, fever, headaches, heartburn, hemoptysis, myalgias, nasal congestion, postnasal drip, rash, rhinorrhea, sore throat, sweats, weight loss or wheezing.      Review of Systems  Constitutional: Positive for fatigue. Negative for fever, chills, weight loss, diaphoresis and appetite change.  HENT: Negative.  Negative for ear pain, postnasal drip, rhinorrhea and sore throat.   Eyes: Negative.   Respiratory: Positive for cough and shortness of breath. Negative for apnea, hemoptysis, choking, chest tightness, wheezing and stridor.   Cardiovascular: Negative.  Negative for chest pain, palpitations and leg swelling.  Gastrointestinal: Negative for heartburn, nausea, vomiting, abdominal pain, diarrhea, constipation and blood in stool.  Endocrine: Negative.   Genitourinary: Negative.   Musculoskeletal: Negative.  Negative for myalgias.  Skin: Negative.  Negative for rash.  Allergic/Immunologic: Negative.   Neurological: Positive for weakness. Negative for dizziness and headaches.  Hematological: Negative.   Psychiatric/Behavioral: Negative.        Objective:   Physical Exam  Constitutional: He is oriented to person, place, and time. He appears well-developed and well-nourished.  Non-toxic appearance. He does not have a sickly appearance. He does not appear ill. No distress.  HENT:  Head: Normocephalic and atraumatic.  Mouth/Throat: Oropharynx is clear and moist. Mucous membranes are pale, not dry and not cyanotic. No oropharyngeal exudate.    Eyes: Conjunctivae are normal. Right eye exhibits no discharge. Left eye exhibits no discharge. No scleral icterus.  Neck: Normal range of motion. Neck supple. No JVD present. No tracheal deviation present. No thyromegaly present.  Cardiovascular: Normal rate, regular rhythm, normal heart sounds and intact distal pulses.  Exam reveals no gallop and no friction rub.   No murmur heard. Pulmonary/Chest: Effort normal and breath sounds normal. No respiratory distress. He has no wheezes. He has no rales. He exhibits no tenderness.  Abdominal: Soft. Bowel sounds are normal. He exhibits no distension and no mass. There is no tenderness. There is no rebound and no guarding.  Musculoskeletal: Normal range of motion. He exhibits no edema or tenderness.  Lymphadenopathy:    He has no cervical adenopathy.  Neurological: He is oriented to person, place, and time.  Skin: Skin is warm and dry. No rash noted. He is not diaphoretic. No erythema. No pallor.  Vitals reviewed.     Lab Results  Component Value Date   WBC 6.5 08/30/2014   HGB 12.2* 08/30/2014   HCT 36.0* 08/30/2014   PLT 278 08/30/2014   GLUCOSE 156* 08/30/2014   CHOL 117 08/13/2014   TRIG 48 08/13/2014   HDL 40 08/13/2014   LDLCALC 67 08/13/2014   ALT 79* 08/23/2014   AST 60* 08/23/2014   NA 142 08/30/2014   K 4.2 08/30/2014   CL 102 08/30/2014   CREATININE 1.30 08/30/2014   BUN 22 08/30/2014   CO2 28 08/23/2014   TSH 0.827 08/13/2014   PSA <0.01 ng/mL* 11/09/2008   INR 2.31* 08/30/2014   HGBA1C 6.6* 07/28/2014   MICROALBUR 1.0 07/28/2014      Assessment & Plan:

## 2014-09-02 NOTE — H&P (Signed)
[facility] History and Physical  [name] RWE:[id number] DOB: [birth year] DOA: 09/02/2014  Referring physician: Dr. Thurnell Garbe PCP: Scarlette Calico, MD   Chief Complaint: Hemoptysis  HPI: [name] is a 74 y.o. male  With multiple cardiac comorbidities please see PMH below, DM type II, CK D stage III, history of DVT at the end of February 2016 of axillary vein, hyperlipidemia, essential hypertension. Presenting to the hospital complaining of one-day complaint of hemoptysis. He states that he usually takes xarelto and a baby aspirin daily. He reports that yesterday he has significant hemoptysis and since it continued today he presented to the hospital for further evaluation recommendations. Nothing he is aware of makes it better or worse. Since onset has wax and wane but has been persistent.   Patient's INR was elevated in the ED and hemoglobin reportedly 13 recently on recheck is 10. Were consulted for further evaluation and recommendations. The patient otherwise has no other complaints. Chest x-ray reporting suspected pneumonia.   Review of Systems:  Constitutional:  No weight loss, night sweats, Fevers, chills, fatigue.  HEENT:  No headaches, Difficulty swallowing,Tooth/dental problems,Sore throat,  No sneezing, itching, ear ache, nasal congestion, post nasal drip,  Cardio-vascular:  No chest pain, Orthopnea, PND, swelling in lower extremities, anasarca, dizziness, palpitations  GI:  No heartburn, indigestion, abdominal pain, nausea, vomiting, diarrhea, change in bowel habits, loss of appetite  Resp:  No shortness of breath with exertion or at rest. No excess mucus, no productive cough, No non-productive cough, + coughing up of blood.No change in color of mucus.No wheezing.No chest wall deformity  Skin:  no rash or lesions.  GU:  no dysuria, change in color of urine, no urgency or frequency. No flank pain.  Musculoskeletal:  No joint pain or swelling. No decreased range of  motion. No back pain.  Psych:  No change in mood or affect. No depression or anxiety. No memory loss.   Past Medical History  Diagnosis Date  . Obesity   . Prostate cancer   . Glucose intolerance (impaired glucose tolerance)   . Bursitis of elbow     left  . Osteoarthritis   . HTN (hypertension)   . HLD (hyperlipidemia)   . History of DVT (deep vein thrombosis)   . Chronic systolic heart failure     echo 4/13: EF 25-30%, mod MR, mod LAE, PASP 47  . NICM (nonischemic cardiomyopathy)     normal cors by cath in 1990s;  nuclear 8/08: no ischemia; inf/lat/dist ant/apical scar  . Moderate mitral regurgitation     echo 4/13  . Ventricular tachycardia 07/2014  . Diabetes mellitus without complication    Past Surgical History  Procedure Laterality Date  . Transurethral resection of prostate    . Cardiac defibrillator placement  10/09    STJ single chamber ICD   . Hernia repair    . Robot assisted laparoscopic radical prostatectomy  6/08  . Left and right heart catheterization with coronary angiogram N/A 08/13/2014    Procedure: LEFT AND RIGHT HEART CATHETERIZATION WITH CORONARY ANGIOGRAM;  Surgeon: Sinclair Grooms, MD;  Location: [facility] CATH LAB;  Service: Cardiovascular;  Laterality: N/A;   Social History:  reports that he quit smoking about 38 years ago. His smoking use included Cigarettes. He has a 5 pack-year smoking history. He has never used smokeless tobacco. He reports that he does not drink alcohol or use illicit drugs.  Allergies  Allergen Reactions  . Spironolactone Other (See Comments)  Cannot remember reaction    Family History  Problem Relation Age of Onset  . Cancer Father     prostate  . Heart disease Mother   Denies any history of bleeding disorders  Prior to Admission medications   Medication Sig Start Date End Date Taking? Authorizing Provider  acetaminophen (TYLENOL) 325 MG tablet Take 2 tablets (650 mg total) by mouth every 4 (four) hours as needed for  headache or mild pain. 08/16/14  Yes Erlene Quan, PA-C  amiodarone (PACERONE) 200 MG tablet Take 1 tablet (200 mg total) by mouth 2 (two) times daily. Patient taking differently: Take 400 mg by mouth 2 (two) times daily.  08/24/14 09/20/14 Yes Lelon Perla, MD  aspirin 81 MG tablet Take 81 mg by mouth daily.     Yes Historical Provider, MD  carvedilol (COREG) 25 MG tablet Take 25 mg by mouth 2 (two) times daily with a meal.     Yes Historical Provider, MD  Cholecalciferol (VITAMIN D-3) 1000 UNITS CAPS Take 1,000 Units by mouth daily.   Yes Historical Provider, MD  COLCRYS 0.6 MG tablet Take 0.6 mg by mouth 2 (two) times daily as needed (for gout symptoms).  03/11/14  Yes Historical Provider, MD  digoxin (LANOXIN) 0.125 MG tablet Take 125 mcg by mouth daily.     Yes Historical Provider, MD  furosemide (LASIX) 40 MG tablet Take 40 mg by mouth daily. Take daily as directed 09/14/11  Yes Thompson Grayer, MD  HYDROcodone-acetaminophen (NORCO) 10-325 MG per tablet Take 1 tablet by mouth every 8 (eight) hours as needed. Patient taking differently: Take 1 tablet by mouth every 8 (eight) hours as needed for moderate pain.  08/21/14  Yes Janith Lima, MD  levofloxacin (LEVAQUIN) 750 MG tablet Take 1 tablet (750 mg total) by mouth daily. 08/30/14  Yes Noemi Chapel, MD  Linaclotide [facility]) 145 MCG CAPS capsule Take 1 capsule (145 mcg total) by mouth daily. Patient taking differently: Take 145 mcg by mouth daily as needed (constipation).  07/28/14  Yes Janith Lima, MD  lisinopril (PRINIVIL,ZESTRIL) 5 MG tablet Take 5 mg by mouth daily.    Yes Historical Provider, MD  Methotrexate Sodium, PF, 50 MG/2ML SOLN Inject 0.8 mLs as directed once a week. Take on sundays   Yes Historical Provider, MD  Rivaroxaban (XARELTO) 15 MG TABS tablet Take 1 tablet (15 mg total) by mouth daily with supper. Patient taking differently: Take 15 mg by mouth 2 (two) times daily with a meal. Called [facility] to clarify, their script instructions  from Xarelto starter pack are 15mg  bid x 21 days, then to 20mg  daily. Unclear how instructions read as daily. 09/02/14  Yes Janith Lima, MD  simvastatin (ZOCOR) 20 MG tablet Take 10 mg by mouth daily.    Yes Historical Provider, MD  TRADJENTA 5 MG TABS tablet TAKE 1 TABLET BY MOUTH DAILY FOR DIABETES 08/24/14  Yes Janith Lima, MD  ULORIC 80 MG TABS TAKE 1 TABLET (80 MG TOTAL) BY MOUTH DAILY. Patient taking differently: TAKE 2 TABLETS IN THE MORNING AND 1 TABLET IN THE EVENING. 04/14/14  Yes Janith Lima, MD   Physical Exam: Filed Vitals:   09/02/14 1526 09/02/14 1810 09/02/14 1830  BP: 153/77 121/64 133/60  Pulse: 81 64 66  Temp: 97.7 F (36.5 C) 98 F (36.7 C)   TempSrc: Oral Oral   Resp: 18 18   SpO2: 99% 98% 98%    Wt Readings from Last 3  Encounters:  09/02/14 90.266 kg (199 lb)  08/30/14 89.359 kg (197 lb)  08/27/14 91.173 kg (201 lb)    General:  Appears calm and comfortable Eyes: PERRL, normal lids, irises & conjunctiva ENT: grossly normal hearing, lips & tongue Neck: no LAD, masses or thyromegaly Cardiovascular: RRR, no m/r/g. No LE edema.  Respiratory: Rhales BL at bases, no wheezes, equal chest rise. Abdomen: soft, nt, nd Skin: no rash or induration seen on limited exam Musculoskeletal: grossly normal tone BUE/BLE Psychiatric: grossly normal mood and affect, speech fluent and appropriate Neurologic: grossly non-focal.          Labs on Admission:  Basic Metabolic Panel:  Recent Labs Lab 08/30/14 1225 09/02/14 1414 09/02/14 1552  NA 142 139 142  K 4.2 4.5 3.9  CL 102 106 108  CO2  --  31 24  GLUCOSE 156* 96 99  BUN 22 18 19   CREATININE 1.30 1.42 1.30  CALCIUM  --  8.9 8.8   Liver Function Tests: No results for input(s): AST, ALT, ALKPHOS, BILITOT, PROT, ALBUMIN in the last 168 hours. No results for input(s): LIPASE, AMYLASE in the last 168 hours. No results for input(s): AMMONIA in the last 168 hours. CBC:  Recent Labs Lab 08/30/14 1207  08/30/14 1225 09/02/14 1414 09/02/14 1552  WBC 6.5  --  8.5 8.8  NEUTROABS  --   --  6.1 6.4  HGB 10.4* 12.2* 10.8* 10.7*  HCT 32.2* 36.0* 33.1* 34.7*  MCV 82.8  --  82.8 84.6  PLT 278  --  266.0 245   Cardiac Enzymes: No results for input(s): CKTOTAL, CKMB, CKMBINDEX, TROPONINI in the last 168 hours.  BNP (last 3 results)  Recent Labs  08/13/14 0257 08/23/14 0557 08/30/14 1207  BNP 372.0* 233.7* 220.8*    ProBNP (last 3 results) No results for input(s): PROBNP in the last 8760 hours.  CBG: No results for input(s): GLUCAP in the last 168 hours.  Radiological Exams on Admission: Dg Chest 2 View  09/02/2014   CLINICAL DATA:  Shortness of breath for the past 2 weeks. Hemoptysis for the past 3 days. Ex-smoker.  EXAM: CHEST  2 VIEW  COMPARISON:  Earlier today at [facility].  FINDINGS: The cardiac silhouette remains borderline enlarged. Stable left subclavian AICD lead. Patchy airspace opacity is again demonstrated at the medial right lung base with preservation of the right heart border. The left lung remains clear. Lower thoracic spine degenerative changes.  IMPRESSION: Stable right lower lobe probable pneumonia.   Electronically Signed   By: Claudie Revering M.D.   On: 09/02/2014 16:53   Dg Chest 2 View  09/02/2014   CLINICAL DATA:  Hemoptysis.  EXAM: CHEST  2 VIEW  COMPARISON:  August 30, 2014.  FINDINGS: The heart size and mediastinal contours are within normal limits. Left lung is clear. Possible mild right infrahilar opacity is noted which may represent focal inflammation. No pneumothorax or pleural effusion is noted. Left-sided pacemaker is unchanged in position. The visualized skeletal structures are unremarkable.  IMPRESSION: Possible mild right infrahilar opacity concerning for pneumonia. Followup radiographs are recommended.   Electronically Signed   By: Marijo Conception, M.D.   On: 09/02/2014 13:59    EKG: Independently reviewed. Sinus rhythm with no ST elevations or  depressions  Assessment/Plan Principal Problem:   Hemoptysis - Telemetry monitoring - Hold xarelto - Monitor INR levels and obtain hepatic function panel discussed with the ED physician and states was going to put order in  Active Problems:   Type II diabetes mellitus with renal manifestations - SSI - Diabetic diet    Hyperlipidemia with target LDL less than 70 - Stable on statin    Paroxysmal ventricular tachycardia - Sinus rhythm on EKG - Continue home medication regimen    Congestive dilated cardiomyopathy - Stable continue home medication regimen    Chronic renal disease, stage III - Stable monitor serum creatinine    DVT of axillary vein, acute left - Discussed with pulmonology and at this point patient is an almost receive a month of anticoagulation. At this juncture patient is at higher risk of bleeding out from hemoptysis as such will hold anticoagulant. Should patient's condition deteriorate we'll consult pulmonology for further evaluation recommendations.    CAP (community acquired pneumonia) - Most likely cause of hemoptysis - Covered with broad-spectrum antibiotics   Code Status: DO NOT RESUSCITATE DVT Prophylaxis: SCDs Family Communication: Discussed with patient and spouse at bedside Disposition Plan: Pending improvement in condition  Time spent: > 65 minutes  Velvet Bathe [facility] Pager [phone]

## 2014-09-02 NOTE — Progress Notes (Signed)
Pre visit review using our clinic review tool, if applicable. No additional management support is needed unless otherwise documented below in the visit note. 

## 2014-09-02 NOTE — Assessment & Plan Note (Signed)
This is improved

## 2014-09-02 NOTE — Assessment & Plan Note (Signed)
Despite a course of levaquin this is not improving Will refer back to the ER for a possible admission

## 2014-09-03 DIAGNOSIS — E1121 Type 2 diabetes mellitus with diabetic nephropathy: Secondary | ICD-10-CM | POA: Diagnosis not present

## 2014-09-03 DIAGNOSIS — Z8249 Family history of ischemic heart disease and other diseases of the circulatory system: Secondary | ICD-10-CM | POA: Diagnosis not present

## 2014-09-03 DIAGNOSIS — Z79899 Other long term (current) drug therapy: Secondary | ICD-10-CM | POA: Diagnosis not present

## 2014-09-03 DIAGNOSIS — I82622 Acute embolism and thrombosis of deep veins of left upper extremity: Secondary | ICD-10-CM | POA: Diagnosis present

## 2014-09-03 DIAGNOSIS — Z8546 Personal history of malignant neoplasm of prostate: Secondary | ICD-10-CM | POA: Diagnosis not present

## 2014-09-03 DIAGNOSIS — Z87891 Personal history of nicotine dependence: Secondary | ICD-10-CM | POA: Diagnosis not present

## 2014-09-03 DIAGNOSIS — N183 Chronic kidney disease, stage 3 (moderate): Secondary | ICD-10-CM | POA: Diagnosis not present

## 2014-09-03 DIAGNOSIS — I420 Dilated cardiomyopathy: Secondary | ICD-10-CM | POA: Diagnosis present

## 2014-09-03 DIAGNOSIS — J189 Pneumonia, unspecified organism: Secondary | ICD-10-CM | POA: Diagnosis not present

## 2014-09-03 DIAGNOSIS — Z888 Allergy status to other drugs, medicaments and biological substances status: Secondary | ICD-10-CM | POA: Diagnosis not present

## 2014-09-03 DIAGNOSIS — I129 Hypertensive chronic kidney disease with stage 1 through stage 4 chronic kidney disease, or unspecified chronic kidney disease: Secondary | ICD-10-CM | POA: Diagnosis present

## 2014-09-03 DIAGNOSIS — Z66 Do not resuscitate: Secondary | ICD-10-CM | POA: Diagnosis present

## 2014-09-03 DIAGNOSIS — I472 Ventricular tachycardia: Secondary | ICD-10-CM | POA: Diagnosis present

## 2014-09-03 DIAGNOSIS — Z7901 Long term (current) use of anticoagulants: Secondary | ICD-10-CM | POA: Diagnosis not present

## 2014-09-03 DIAGNOSIS — R042 Hemoptysis: Secondary | ICD-10-CM | POA: Diagnosis not present

## 2014-09-03 DIAGNOSIS — E785 Hyperlipidemia, unspecified: Secondary | ICD-10-CM | POA: Diagnosis present

## 2014-09-03 DIAGNOSIS — Z8042 Family history of malignant neoplasm of prostate: Secondary | ICD-10-CM | POA: Diagnosis not present

## 2014-09-03 DIAGNOSIS — Z7982 Long term (current) use of aspirin: Secondary | ICD-10-CM | POA: Diagnosis not present

## 2014-09-03 DIAGNOSIS — M109 Gout, unspecified: Secondary | ICD-10-CM | POA: Diagnosis present

## 2014-09-03 DIAGNOSIS — M1990 Unspecified osteoarthritis, unspecified site: Secondary | ICD-10-CM | POA: Diagnosis present

## 2014-09-03 DIAGNOSIS — I808 Phlebitis and thrombophlebitis of other sites: Secondary | ICD-10-CM | POA: Diagnosis not present

## 2014-09-03 DIAGNOSIS — I340 Nonrheumatic mitral (valve) insufficiency: Secondary | ICD-10-CM | POA: Diagnosis present

## 2014-09-03 DIAGNOSIS — I5022 Chronic systolic (congestive) heart failure: Secondary | ICD-10-CM | POA: Diagnosis present

## 2014-09-03 DIAGNOSIS — Z79891 Long term (current) use of opiate analgesic: Secondary | ICD-10-CM | POA: Diagnosis not present

## 2014-09-03 DIAGNOSIS — E1122 Type 2 diabetes mellitus with diabetic chronic kidney disease: Secondary | ICD-10-CM | POA: Diagnosis present

## 2014-09-03 LAB — CBC
HCT: 30.2 % — ABNORMAL LOW (ref 39.0–52.0)
Hemoglobin: 9.5 g/dL — ABNORMAL LOW (ref 13.0–17.0)
MCH: 26.5 pg (ref 26.0–34.0)
MCHC: 31.5 g/dL (ref 30.0–36.0)
MCV: 84.1 fL (ref 78.0–100.0)
PLATELETS: 219 10*3/uL (ref 150–400)
RBC: 3.59 MIL/uL — ABNORMAL LOW (ref 4.22–5.81)
RDW: 16.9 % — ABNORMAL HIGH (ref 11.5–15.5)
WBC: 8.2 10*3/uL (ref 4.0–10.5)

## 2014-09-03 LAB — COMPREHENSIVE METABOLIC PANEL
ALBUMIN: 3.2 g/dL — AB (ref 3.5–5.2)
ALK PHOS: 46 U/L (ref 39–117)
ALT: 32 U/L (ref 0–53)
AST: 20 U/L (ref 0–37)
Anion gap: 9 (ref 5–15)
BILIRUBIN TOTAL: 1.5 mg/dL — AB (ref 0.3–1.2)
BUN: 15 mg/dL (ref 6–23)
CO2: 24 mmol/L (ref 19–32)
CREATININE: 1.22 mg/dL (ref 0.50–1.35)
Calcium: 8.5 mg/dL (ref 8.4–10.5)
Chloride: 108 mmol/L (ref 96–112)
GFR calc Af Amer: 67 mL/min — ABNORMAL LOW (ref >90)
GFR calc non Af Amer: 57 mL/min — ABNORMAL LOW (ref >90)
Glucose, Bld: 112 mg/dL — ABNORMAL HIGH (ref 70–99)
POTASSIUM: 3.7 mmol/L (ref 3.5–5.1)
Sodium: 141 mmol/L (ref 135–145)
Total Protein: 6.4 g/dL (ref 6.0–8.3)

## 2014-09-03 LAB — GLUCOSE, CAPILLARY
Glucose-Capillary: 120 mg/dL — ABNORMAL HIGH (ref 70–99)
Glucose-Capillary: 127 mg/dL — ABNORMAL HIGH (ref 70–99)

## 2014-09-03 LAB — CBG MONITORING, ED: GLUCOSE-CAPILLARY: 108 mg/dL — AB (ref 70–99)

## 2014-09-03 LAB — HIV ANTIBODY (ROUTINE TESTING W REFLEX): HIV Screen 4th Generation wRfx: NONREACTIVE

## 2014-09-03 MED ORDER — ALLOPURINOL 100 MG PO TABS
100.0000 mg | ORAL_TABLET | Freq: Every day | ORAL | Status: DC
  Administered 2014-09-03: 100 mg via ORAL
  Filled 2014-09-03 (×2): qty 1

## 2014-09-03 MED ORDER — COLCHICINE 0.6 MG PO TABS
0.6000 mg | ORAL_TABLET | Freq: Two times a day (BID) | ORAL | Status: DC | PRN
  Filled 2014-09-03: qty 1

## 2014-09-03 MED ORDER — DEXTROSE 5 % IV SOLN
1.0000 g | INTRAVENOUS | Status: DC
  Administered 2014-09-03 – 2014-09-06 (×4): 1 g via INTRAVENOUS
  Filled 2014-09-03 (×7): qty 10

## 2014-09-03 MED ORDER — LISINOPRIL 10 MG PO TABS
5.0000 mg | ORAL_TABLET | Freq: Every day | ORAL | Status: DC
  Administered 2014-09-03 – 2014-09-07 (×5): 5 mg via ORAL
  Filled 2014-09-03 (×5): qty 1

## 2014-09-03 MED ORDER — DIGOXIN 125 MCG PO TABS
125.0000 ug | ORAL_TABLET | Freq: Every day | ORAL | Status: DC
  Administered 2014-09-03 – 2014-09-07 (×5): 125 ug via ORAL
  Filled 2014-09-03 (×5): qty 1

## 2014-09-03 MED ORDER — HYDROMORPHONE HCL 1 MG/ML IJ SOLN
1.0000 mg | INTRAMUSCULAR | Status: DC | PRN
  Administered 2014-09-03 (×2): 1 mg via INTRAVENOUS
  Filled 2014-09-03 (×2): qty 1

## 2014-09-03 MED ORDER — CARVEDILOL 25 MG PO TABS
25.0000 mg | ORAL_TABLET | Freq: Two times a day (BID) | ORAL | Status: DC
  Administered 2014-09-03 – 2014-09-07 (×9): 25 mg via ORAL
  Filled 2014-09-03 (×11): qty 1

## 2014-09-03 MED ORDER — COLCHICINE 0.6 MG PO TABS
0.6000 mg | ORAL_TABLET | Freq: Two times a day (BID) | ORAL | Status: DC
  Administered 2014-09-03 – 2014-09-07 (×9): 0.6 mg via ORAL
  Filled 2014-09-03 (×11): qty 1

## 2014-09-03 MED ORDER — AMIODARONE HCL 200 MG PO TABS
400.0000 mg | ORAL_TABLET | Freq: Two times a day (BID) | ORAL | Status: DC
  Administered 2014-09-03 – 2014-09-07 (×9): 400 mg via ORAL
  Filled 2014-09-03 (×10): qty 2

## 2014-09-03 MED ORDER — ASPIRIN EC 81 MG PO TBEC
81.0000 mg | DELAYED_RELEASE_TABLET | Freq: Every day | ORAL | Status: DC
  Administered 2014-09-03: 81 mg via ORAL
  Filled 2014-09-03: qty 1

## 2014-09-03 MED ORDER — SIMVASTATIN 10 MG PO TABS
10.0000 mg | ORAL_TABLET | Freq: Every day | ORAL | Status: DC
  Administered 2014-09-03 – 2014-09-06 (×4): 10 mg via ORAL
  Filled 2014-09-03 (×5): qty 1

## 2014-09-03 MED ORDER — DEXTROSE 5 % IV SOLN
500.0000 mg | INTRAVENOUS | Status: DC
  Administered 2014-09-03 – 2014-09-06 (×4): 500 mg via INTRAVENOUS
  Filled 2014-09-03 (×5): qty 500

## 2014-09-03 MED ORDER — INSULIN ASPART 100 UNIT/ML ~~LOC~~ SOLN
0.0000 [IU] | Freq: Three times a day (TID) | SUBCUTANEOUS | Status: DC
  Administered 2014-09-04 – 2014-09-05 (×2): 2 [IU] via SUBCUTANEOUS
  Administered 2014-09-06: 3 [IU] via SUBCUTANEOUS
  Administered 2014-09-07: 2 [IU] via SUBCUTANEOUS

## 2014-09-03 NOTE — Progress Notes (Addendum)
Patient ID: [name], male   DOB: [birth year], 74 y.o.   MRN: [id number] [facility] PROGRESS NOTE  [name] [id number]:[id number] DOB: 01-Jan-1941 DOA: 09/02/2014 PCP: Scarlette Calico, MD  Brief narrative:    74 y.o. male with multiple cardiac comorbidities, history of DVT, dyslipidemia, diabetes, chronic kidney disease stage III who presented to [facility] long hospital with complaints of hemoptysis. He is on anticoagulation with xarelto and in addition he takes daily aspirin. Hemoptysis started one day prior to this admission. In ED, patient was noted to have INR at 3.2. Hemoglobin was 10.8 at baseline is around 13. Patient was admitted for further evaluation and management. His chest x-ray was suspicious for pneumonia and he was started on azithromycin and Rocephin.   Assessment/Plan:    Principal Problem: Hemoptysis - Likely triggered by anticoagulation which is now put on hold. - Hemoglobin was 10.8 on the admission and this morning 9.5. Continue to monitor CBC daily. - Per patient, hemoptysis is improving. We will hold aspirin as well. - May need CT scan for further evaluation if hemoptysis does not resolve.  Active Problems:  Type II diabetes mellitus with renal manifestations - A1c in February 2016 was 6.6 indicating good glycemic control. - CBG 108   Hyperlipidemia with target LDL less than 70 - Continue simvastatin 10 mg at bedtime.   Paroxysmal ventricular tachycardia - Sinus rhythm on EKG - Continue carvedilol 25 mg twice daily, amiodarone and digoxin.   Congestive dilated cardiomyopathy - Stable, continue amiodarone, digoxin, carvedilol and lisinopril.   Chronic renal disease, stage III - Stable  - Creatinine is within normal limits.   DVT of axillary vein, acute left - [facility] has discussed with pulmonology and at this point patient has almost received a month of anticoagulation. At this time he is at high risk of bleeding out from hemoptysis so we will continue to  hold anticoagulation.   CAP (community acquired pneumonia) - Most likely cause of hemoptysis - Continue azithromycin and Rocephin.    Gout - Will start colchicine and see if that helps.  DVT Prophylaxis  - SCDs bilaterally due to risk of bleeding   Code Status: DNR/DNI Family Communication:  plan of care discussed with the patient Disposition Plan: Needs antibiotics for treatment of pneumonia. He is not stable for discharge today.  IV access:  Peripheral IV  Procedures and diagnostic studies:    Dg Chest 2 View 09/02/2014   Stable right lower lobe probable pneumonia.     Dg Chest 2 View 09/02/2014   Possible mild right infrahilar opacity concerning for pneumonia. Followup radiographs are recommended.     Dg Chest 2 View 08/30/2014   1. Bilateral interstitial thickening concerning for an infectious etiology including atypical infections.   Electronically Signed   By: Kathreen Devoid   On: 08/30/2014 13:07   Ct Angio Chest Pe W/cm &/or Wo Cm 08/30/2014    No evidence of pulmonary embolism.  Moderate patchy bilateral airspace process most prominent over the right lower lobe compatible with a pneumonia. No evidence of effusion.  Mild cardiomegaly.  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   Electronically Signed   By: Marin Olp M.D.   On: 08/30/2014 14:05    Medical Consultants:  None   Other Consultants:  Physical therapy   IAnti-Infectives:   Azithromycin 09/02/2014 --> Rocephin 09/02/2014 -->   Leisa Lenz, MD  [facility] Pager [phone]  If 7PM-7AM, please contact night-coverage [email or web address] Password [facility] 09/03/2014, 11:23 AM  HPI/Subjective: No acute overnight events.  Objective: Filed Vitals:   09/03/14 0030 09/03/14 0136 09/03/14 0621 09/03/14 0929  BP: 138/70 129/58 125/71 136/67  Pulse: 83 69 77 86  Temp:   98 F (36.7 C)   TempSrc:   Oral   Resp:  18 16 24   SpO2: 100% 98% 97% 95%   No intake or output data in the 24 hours ending 09/03/14  1123  Exam:   General:  Pt is alert, follows commands appropriately, not in acute distress  Cardiovascular: Regular rate and rhythm, S1/S2, no murmurs  Respiratory: Clear to auscultation bilaterally, no wheezing, no crackles, no rhonchi  Abdomen: Soft, non tender, non distended, bowel sounds present  Extremities: No edema, pulses DP and PT palpable bilaterally  Neuro: Grossly nonfocal  Data Reviewed: Basic Metabolic Panel:  Recent Labs Lab 08/30/14 1225 09/02/14 1414 09/02/14 1552 09/03/14 0419  NA 142 139 142 141  K 4.2 4.5 3.9 3.7  CL 102 106 108 108  CO2  --  31 24 24   GLUCOSE 156* 96 99 112*  BUN 22 18 19 15   CREATININE 1.30 1.42 1.30 1.22  CALCIUM  --  8.9 8.8 8.5   Liver Function Tests:  Recent Labs Lab 09/02/14 1801 09/03/14 0419  AST 25 20  ALT 37 32  ALKPHOS 50 46  BILITOT 1.0 1.5*  PROT 6.9 6.4  ALBUMIN 3.6 3.2*   No results for input(s): LIPASE, AMYLASE in the last 168 hours. No results for input(s): AMMONIA in the last 168 hours. CBC:  Recent Labs Lab 08/30/14 1207 08/30/14 1225 09/02/14 1414 09/02/14 1552 09/03/14 0419  WBC 6.5  --  8.5 8.8 8.2  NEUTROABS  --   --  6.1 6.4  --   HGB 10.4* 12.2* 10.8* 10.7* 9.5*  HCT 32.2* 36.0* 33.1* 34.7* 30.2*  MCV 82.8  --  82.8 84.6 84.1  PLT 278  --  266.0 245 219   Cardiac Enzymes: No results for input(s): CKTOTAL, CKMB, CKMBINDEX, TROPONINI in the last 168 hours. BNP: Invalid input(s): POCBNP CBG:  Recent Labs Lab 09/03/14 0905  GLUCAP 108*    No results found for this or any previous visit (from the past 240 hour(s)).   Scheduled Meds: . allopurinol  100 mg Oral Daily  . amiodarone  400 mg Oral BID  . aspirin EC  81 mg Oral Daily  . carvedilol  25 mg Oral BID WC  . colchicine  0.6 mg Oral BID  . digoxin  125 mcg Oral Daily  . insulin aspart  0-15 Units Subcutaneous TID WC  . lisinopril  5 mg Oral Daily  . simvastatin  10 mg Oral q1800   Continuous Infusions: .  azithromycin    . cefTRIAXone (ROCEPHIN)  IV

## 2014-09-03 NOTE — Progress Notes (Signed)
  Amiodarone Drug - Drug Interaction Consult Note  Recommendations: Amiodarone is metabolized by the cytochrome P450 system and therefore has the potential to cause many drug interactions. Amiodarone has an average plasma half-life of 50 days (range 20 to 100 days).   There is potential for drug interactions to occur several weeks or months after stopping treatment and the onset of drug interactions may be slow after initiating amiodarone.   []  Statins: Increased risk of myopathy. Simvastatin- restrict dose to 20mg  daily. Other statins: counsel patients to report any muscle pain or weakness immediately.  []  Anticoagulants: Amiodarone can increase anticoagulant effect. Consider warfarin dose reduction. Patients should be monitored closely and the dose of anticoagulant altered accordingly, remembering that amiodarone levels take several weeks to stabilize.  []  Antiepileptics: Amiodarone can increase plasma concentration of phenytoin, the dose should be reduced. Note that small changes in phenytoin dose can result in large changes in levels. Monitor patient and counsel on signs of toxicity.  []  Beta blockers: increased risk of bradycardia, AV block and myocardial depression. Sotalol - avoid concomitant use.  []   Calcium channel blockers (diltiazem and verapamil): increased risk of bradycardia, AV block and myocardial depression.  []   Cyclosporine: Amiodarone increases levels of cyclosporine. Reduced dose of cyclosporine is recommended.  [x]  Digoxin dose should be halved when amiodarone is started. May need to simply monitor as patient has been on this combination PTA []  Diuretics: increased risk of cardiotoxicity if hypokalemia occurs.  []  Oral hypoglycemic agents (glyburide, glipizide, glimepiride): increased risk of hypoglycemia. Patient's glucose levels should be monitored closely when initiating amiodarone therapy.   [x]  Drugs that prolong the QT interval:  Torsades de pointes risk may be  increased with concurrent use - avoid if possible.  Monitor QTc, also keep magnesium/potassium WNL if concurrent therapy can't be avoided. Marland Kitchen Antibiotics: e.g. fluoroquinolones, erythromycin. . Antiarrhythmics: e.g. quinidine, procainamide, disopyramide, sotalol. . Antipsychotics: e.g. phenothiazines, haloperidol.  . Lithium, tricyclic antidepressants, and methadone. Thank You,  Lorenso Courier [facility]  09/03/2014 1:16 PM

## 2014-09-03 NOTE — Progress Notes (Signed)
PT Cancellation Note  Patient Details Name: HOANG PETTINGILL MRN: [id number] DOB: [birth year]   Cancelled Treatment:    Reason Eval/Treat Not Completed: Medical issues which prohibited therapy (reports pain meds have made him too drowsy.)   Claretha Cooper 09/03/2014, 4:44 PM

## 2014-09-04 ENCOUNTER — Encounter: Payer: Self-pay | Admitting: Internal Medicine

## 2014-09-04 ENCOUNTER — Ambulatory Visit: Payer: Federal, State, Local not specified - PPO | Admitting: Internal Medicine

## 2014-09-04 LAB — EXPECTORATED SPUTUM ASSESSMENT W REFEX TO RESP CULTURE: SPECIAL REQUESTS: NORMAL

## 2014-09-04 LAB — GLUCOSE, CAPILLARY
GLUCOSE-CAPILLARY: 111 mg/dL — AB (ref 70–99)
GLUCOSE-CAPILLARY: 108 mg/dL — AB (ref 70–99)
Glucose-Capillary: 139 mg/dL — ABNORMAL HIGH (ref 70–99)
Glucose-Capillary: 108 mg/dL — ABNORMAL HIGH (ref 70–99)

## 2014-09-04 MED ORDER — DOCUSATE SODIUM 100 MG PO CAPS
100.0000 mg | ORAL_CAPSULE | Freq: Two times a day (BID) | ORAL | Status: DC | PRN
  Filled 2014-09-04: qty 1

## 2014-09-04 NOTE — Evaluation (Signed)
Physical Therapy Evaluation Patient Details Name: [name] MRN: [id number] DOB: 1940/08/20 Today's Date: 09/04/2014   History of Present Illness  74 yo maleWith multiple cardiac comorbidities , ICDDM  type II, CK D stage III, history of DVT at the end of February 2016 of axillary vein, hyperlipidemia, essential hypertension. Presenting to the hospital 3/16/16complaining of one-day complaint of hemoptysis.  Clinical Impression  Patient  Ambulatory, c/o L foot/ankle discomfort but able to ambulate. Declines need for Rw. No further PT needs aty this time.    Follow Up Recommendations No PT follow up    Equipment Recommendations  None recommended by PT    Recommendations for Other Services       Precautions / Restrictions Precautions Precaution Comments: ICD Required Braces or Orthoses: Other Brace/Splint Other Brace/Splint: L foot deformity, wears shoes, can walk without      Mobility  Bed Mobility Overal bed mobility: Independent                Transfers Overall transfer level: Independent                  Ambulation/Gait Ambulation/Gait assistance: Supervision Ambulation Distance (Feet): 150 Feet Assistive device: None Gait Pattern/deviations: WFL(Within Functional Limits)     General Gait Details: slower gait due to L foot pain per patient but is functional.  Stairs            Wheelchair Mobility    Modified Rankin (Stroke Patients Only)       Balance Overall balance assessment: No apparent balance deficits (not formally assessed)                                           Pertinent Vitals/Pain Pain Assessment: 0-10 Pain Score: 4  Pain Location: Lhind foot.ankle Pain Descriptors / Indicators: Aching Pain Intervention(s): Limited activity within patient's tolerance;Monitored during session;Premedicated before session    Home Living Family/patient expects to be discharged to:: Private residence Living Arrangements:  Spouse/significant other Available Help at Discharge: Family Type of Home: House Home Access: Stairs to enter   Technical brewer of Steps: 1 Home Layout: Two level;Able to live on main level with bedroom/bathroom Home Equipment: Kasandra Knudsen - single point;Crutches      Prior Function Level of Independence: Independent               Hand Dominance        Extremity/Trunk Assessment   Upper Extremity Assessment: Overall WFL for tasks assessed           Lower Extremity Assessment: LLE deficits/detail;Overall WFL for tasks assessed   LLE Deficits / Details: injury midfoot-past.     Communication   Communication: HOH;No difficulties  Cognition Arousal/Alertness: Awake/alert Behavior During Therapy: WFL for tasks assessed/performed Overall Cognitive Status: Within Functional Limits for tasks assessed                      General Comments      Exercises        Assessment/Plan    PT Assessment Patent does not need any further PT services  PT Diagnosis Acute pain   PT Problem List    PT Treatment Interventions     PT Goals (Current goals can be found in the Care Plan section) Acute Rehab PT Goals PT Goal Formulation: All assessment and education complete, DC therapy  Frequency     Barriers to discharge        Co-evaluation               End of Session   Activity Tolerance: Patient tolerated treatment well Patient left: with call bell/phone within reach Nurse Communication: Mobility status         Time: 2620-3559 PT Time Calculation (min) (ACUTE ONLY): 10 min   Charges:   PT Evaluation $Initial PT Evaluation Tier I: 1 Procedure     PT G CodesClaretha Cooper 09/04/2014, 9:57 AM Tresa Endo PT [phone]

## 2014-09-04 NOTE — Progress Notes (Signed)
Patient ID: [name], male   DOB: [birth year], 74 y.o.   MRN: [id number] [facility] PROGRESS NOTE  CRISTEN MURCIA BPZ:[id number] DOB: 1941-06-11 DOA: 09/02/2014 PCP: Scarlette Calico, MD  Brief narrative:    74 y.o. male with multiple cardiac comorbidities, history of DVT, dyslipidemia, diabetes, chronic kidney disease stage III who presented to [facility] long hospital with complaints of hemoptysis. He is on anticoagulation with xarelto and in addition he takes daily aspirin. Hemoptysis started one day prior to this admission. In ED, patient was noted to have INR at 3.2. Hemoglobin was 10.8 at baseline is around 13. Patient was admitted for further evaluation and management. His chest x-ray was suspicious for pneumonia and he was started on azithromycin and Rocephin.   Assessment/Plan:    Principal Problem: Hemoptysis - Likely triggered by anticoagulation. Anticoagulation remains on hold. - Hemoglobin 9.5 yesterday. CBC is pending this morning. - If hemoptysis does not resolve we will place order for CT scan for further evaluation.  Active Problems:  Type II diabetes mellitus with renal manifestations - A1c in February 2016 was 6.6 indicating good glycemic control.   Hyperlipidemia with target LDL less than 70 - Continue simvastatin 10 mg at bedtime.   Paroxysmal ventricular tachycardia - Continue carvedilol 25 mg twice daily, amiodarone and digoxin.   Congestive dilated cardiomyopathy - Stable - We will continue amiodarone, digoxin, carvedilol and lisinopril.   Chronic renal disease, stage III - Stable  - Creatinine within normal limits.   DVT of axillary vein, acute left - [facility] has discussed with pulmonology and at this point patient has almost received a month of anticoagulation.  - Patient is at high risk of bleeding out from hemoptysis so we will continue to hold anticoagulation.   CAP (community acquired pneumonia) - Continue azithromycin and Rocephin. - Blood  cultures to date are negative.    Gout - Continue colchicine. Pain improving.  DVT Prophylaxis  - SCDs bilaterally due to risk of bleeding   Code Status: DNR/DNI Family Communication:  plan of care discussed with the patient Disposition Plan: Still with some hemoptysis. He reports it is improving. Likely discharge in next 24 hours.  IV access:  Peripheral IV  Procedures and diagnostic studies:    Dg Chest 2 View 09/02/2014   Stable right lower lobe probable pneumonia.     Dg Chest 2 View 09/02/2014   Possible mild right infrahilar opacity concerning for pneumonia. Followup radiographs are recommended.     Dg Chest 2 View 08/30/2014   1. Bilateral interstitial thickening concerning for an infectious etiology including atypical infections.   Electronically Signed   By: Kathreen Devoid   On: 08/30/2014 13:07   Ct Angio Chest Pe W/cm &/or Wo Cm 08/30/2014    No evidence of pulmonary embolism.  Moderate patchy bilateral airspace process most prominent over the right lower lobe compatible with a pneumonia. No evidence of effusion.  Mild cardiomegaly.  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   Electronically Signed   By: Marin Olp M.D.   On: 08/30/2014 14:05    Medical Consultants:  None   Other Consultants:  Physical therapy   IAnti-Infectives:   Azithromycin 09/02/2014 --> Rocephin 09/02/2014 -->   Leisa Lenz, MD  [facility] Pager [phone]  If 7PM-7AM, please contact night-coverage [email or web address] Password [email or web address] 09/04/2014, 11:09 AM   LOS: 2 day    HPI/Subjective: No acute overnight events.  Objective: Filed Vitals:   09/03/14 2202 09/04/14 0546 09/04/14 0700 09/04/14 1100  BP: 123/65 112/61 124/70 124/67  Pulse: 70 76 75 75  Temp: 98 F (36.7 C) 98 F (36.7 C) 98.2 F (36.8 C)   TempSrc: Oral Oral Oral   Resp: 17 17 17    Height:      Weight:      SpO2: 98% 99% 100%     Intake/Output Summary (Last 24 hours) at 09/04/14 1109 Last data filed at 09/04/14 0800   Gross per 24 hour  Intake    540 ml  Output    200 ml  Net    340 ml    Exam:   General:  Pt is not in acute distress  Cardiovascular: Regular rate and rhythm, S1/S2 appreciated  Respiratory: Bilateral air entry, no wheezing  Abdomen: Nontender, nondistended abdomen, appreciate bowel sounds  Extremities: No leg swelling, pulses palpable  Neuro: Nonfocal  Data Reviewed: Basic Metabolic Panel:  Recent Labs Lab 08/30/14 1225 09/02/14 1414 09/02/14 1552 09/03/14 0419  NA 142 139 142 141  K 4.2 4.5 3.9 3.7  CL 102 106 108 108  CO2  --  31 24 24   GLUCOSE 156* 96 99 112*  BUN 22 18 19 15   CREATININE 1.30 1.42 1.30 1.22  CALCIUM  --  8.9 8.8 8.5   Liver Function Tests:  Recent Labs Lab 09/02/14 1801 09/03/14 0419  AST 25 20  ALT 37 32  ALKPHOS 50 46  BILITOT 1.0 1.5*  PROT 6.9 6.4  ALBUMIN 3.6 3.2*   No results for input(s): LIPASE, AMYLASE in the last 168 hours. No results for input(s): AMMONIA in the last 168 hours. CBC:  Recent Labs Lab 08/30/14 1207 08/30/14 1225 09/02/14 1414 09/02/14 1552 09/03/14 0419  WBC 6.5  --  8.5 8.8 8.2  NEUTROABS  --   --  6.1 6.4  --   HGB 10.4* 12.2* 10.8* 10.7* 9.5*  HCT 32.2* 36.0* 33.1* 34.7* 30.2*  MCV 82.8  --  82.8 84.6 84.1  PLT 278  --  266.0 245 219   Cardiac Enzymes: No results for input(s): CKTOTAL, CKMB, CKMBINDEX, TROPONINI in the last 168 hours. BNP: Invalid input(s): POCBNP CBG:  Recent Labs Lab 09/03/14 0905 09/03/14 1647 09/03/14 2204 09/04/14 0742  GLUCAP 108* 120* 127* 111*    Recent Results (from the past 240 hour(s))  Culture, blood (routine x 2) Call MD if unable to obtain prior to antibiotics being given     Status: None (Preliminary result)   Collection Time: 09/03/14  1:27 AM  Result Value Ref Range Status   Specimen Description BLOOD RIGHT HAND  Final   Special Requests BOTTLES DRAWN AEROBIC AND ANAEROBIC 8CC  Final   Culture   Final           BLOOD CULTURE RECEIVED NO  GROWTH TO DATE CULTURE WILL BE HELD FOR 5 DAYS BEFORE ISSUING A FINAL NEGATIVE REPORT Performed at [facility]    Report Status PENDING  Incomplete  Culture, blood (routine x 2) Call MD if unable to obtain prior to antibiotics being given     Status: None (Preliminary result)   Collection Time: 09/03/14  2:03 AM  Result Value Ref Range Status   Specimen Description BLOOD RIGHT HAND  Final   Special Requests BOTTLES DRAWN AEROBIC AND ANAEROBIC 5CC  Final   Culture   Final           BLOOD CULTURE RECEIVED NO GROWTH TO DATE CULTURE WILL BE HELD FOR 5 DAYS BEFORE ISSUING A FINAL  NEGATIVE REPORT Performed at [facility]    Report Status PENDING  Incomplete     Scheduled Meds: . amiodarone  400 mg Oral BID  . azithromycin  500 mg Intravenous Q24H  . carvedilol  25 mg Oral BID WC  . cefTRIAXone (ROCEPHIN)  IV  1 g Intravenous Q24H  . colchicine  0.6 mg Oral BID  . digoxin  125 mcg Oral Daily  . insulin aspart  0-15 Units Subcutaneous TID WC  . lisinopril  5 mg Oral Daily  . simvastatin  10 mg Oral q1800   Continuous Infusions:

## 2014-09-05 LAB — GLUCOSE, CAPILLARY
Glucose-Capillary: 111 mg/dL — ABNORMAL HIGH (ref 70–99)
Glucose-Capillary: 130 mg/dL — ABNORMAL HIGH (ref 70–99)
Glucose-Capillary: 105 mg/dL — ABNORMAL HIGH (ref 70–99)
Glucose-Capillary: 113 mg/dL — ABNORMAL HIGH (ref 70–99)

## 2014-09-05 MED ORDER — ASPIRIN EC 81 MG PO TBEC
81.0000 mg | DELAYED_RELEASE_TABLET | Freq: Every day | ORAL | Status: DC
  Administered 2014-09-06 – 2014-09-07 (×2): 81 mg via ORAL
  Filled 2014-09-05 (×2): qty 1

## 2014-09-05 NOTE — Progress Notes (Signed)
Medicare Important Message given? YES  Date Medicare IM given:  09/05/2014 Medicare IM given by: Jonnie Finner

## 2014-09-05 NOTE — Progress Notes (Signed)
Patient ID: [name], male   DOB: [birth year], 74 y.o.   MRN: [id number] [facility] PROGRESS NOTE  KASHEEM TONER MPN:[id number] DOB: 1941/05/10 DOA: 09/02/2014 PCP: Scarlette Calico, MD  Brief narrative:    74 y.o. male with multiple cardiac comorbidities, history of DVT, dyslipidemia, diabetes, chronic kidney disease stage III who presented to [facility] long hospital with complaints of hemoptysis. He is on anticoagulation with xarelto and in addition he takes daily aspirin. Hemoptysis started one day prior to this admission. In ED, patient was noted to have INR at 3.2. Hemoglobin was 10.8 at baseline is around 13. Patient was admitted for further evaluation and management. His chest x-ray was suspicious for pneumonia and he was started on azithromycin and Rocephin.   Assessment/Plan:    Principal Problem: Hemoptysis - Likely triggered by anticoagulation.  - Hemoptysis seems to be better but still some bleed noted. If better by tomorrow then will start aspirin tomorrow. - Hemoglobin stable. Check CBC in am  Active Problems:  Type II diabetes mellitus with renal manifestations - A1c in February 2016 was 6.6 indicating good glycemic control.   Hyperlipidemia with target LDL less than 70 - Continue simvastatin 10 mg at bedtime.   Paroxysmal ventricular tachycardia - Continue carvedilol 25 mg twice daily, amiodarone and digoxin.   Congestive dilated cardiomyopathy - Compensated  - We will continue amiodarone, digoxin, carvedilol and lisinopril.   Chronic renal disease, stage III - Creatinine within normal limits.   DVT of axillary vein, acute left - [facility] has discussed with pulmonology and at this point patient has almost received a month of anticoagulation.  - Patient is at high risk of bleeding out from hemoptysis so we will continue to hold anticoagulation.   CAP (community acquired pneumonia) - Continue azithromycin and Rocephin. - Blood cultures to date are  negative.    Gout - Continue colchicine.   DVT Prophylaxis  - SCDs bilaterally due to risk of bleeding   Code Status: DNR/DNI Family Communication:  plan of care discussed with the patient Disposition Plan: Hemoptysis improving. Start aspirin tomorrow if no hemoptysis and then observe for 24 hours and if no bleed then ok to be d/c on aspirin.   IV access:  Peripheral IV  Procedures and diagnostic studies:    Dg Chest 2 View 09/02/2014   Stable right lower lobe probable pneumonia.     Dg Chest 2 View 09/02/2014   Possible mild right infrahilar opacity concerning for pneumonia. Followup radiographs are recommended.     Dg Chest 2 View 08/30/2014   1. Bilateral interstitial thickening concerning for an infectious etiology including atypical infections.   Electronically Signed   By: Kathreen Devoid   On: 08/30/2014 13:07   Ct Angio Chest Pe W/cm &/or Wo Cm 08/30/2014    No evidence of pulmonary embolism.  Moderate patchy bilateral airspace process most prominent over the right lower lobe compatible with a pneumonia. No evidence of effusion.  Mild cardiomegaly.  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   Electronically Signed   By: Marin Olp M.D.   On: 08/30/2014 14:05    Medical Consultants:  None   Other Consultants:  Physical therapy   IAnti-Infectives:   Azithromycin 09/02/2014 --> Rocephin 09/02/2014 -->   Leisa Lenz, MD  [facility] Pager [phone]  If 7PM-7AM, please contact night-coverage [email or web address] Password [facility] 09/05/2014, 3:47 PM   LOS: 3 day    HPI/Subjective: No acute overnight events.  Objective: Filed Vitals:   09/05/14 0457  09/05/14 0700 09/05/14 1056 09/05/14 1422  BP: 125/76 141/76 109/56 119/64  Pulse: 72  72 70  Temp: 98.7 F (37.1 C)   98 F (36.7 C)  TempSrc: Oral   Oral  Resp: 18     Height:      Weight:      SpO2: 99%   100%    Intake/Output Summary (Last 24 hours) at 09/05/14 1547 Last data filed at 09/05/14 0858  Gross per 24 hour   Intake    410 ml  Output      0 ml  Net    410 ml    Exam:   General:  Pt is awake, not in distress  Cardiovascular: RRR, (+) S1,S2  Respiratory: clear to auscultations bilaterally, no crackles  Abdomen: (+) BS, non tender, non distended  Extremities: No edema, pulses palpable   Neuro: No focal deficits.  Data Reviewed: Basic Metabolic Panel:  Recent Labs Lab 08/30/14 1225 09/02/14 1414 09/02/14 1552 09/03/14 0419  NA 142 139 142 141  K 4.2 4.5 3.9 3.7  CL 102 106 108 108  CO2  --  31 24 24   GLUCOSE 156* 96 99 112*  BUN 22 18 19 15   CREATININE 1.30 1.42 1.30 1.22  CALCIUM  --  8.9 8.8 8.5   Liver Function Tests:  Recent Labs Lab 09/02/14 1801 09/03/14 0419  AST 25 20  ALT 37 32  ALKPHOS 50 46  BILITOT 1.0 1.5*  PROT 6.9 6.4  ALBUMIN 3.6 3.2*   No results for input(s): LIPASE, AMYLASE in the last 168 hours. No results for input(s): AMMONIA in the last 168 hours. CBC:  Recent Labs Lab 08/30/14 1207 08/30/14 1225 09/02/14 1414 09/02/14 1552 09/03/14 0419  WBC 6.5  --  8.5 8.8 8.2  NEUTROABS  --   --  6.1 6.4  --   HGB 10.4* 12.2* 10.8* 10.7* 9.5*  HCT 32.2* 36.0* 33.1* 34.7* 30.2*  MCV 82.8  --  82.8 84.6 84.1  PLT 278  --  266.0 245 219   Cardiac Enzymes: No results for input(s): CKTOTAL, CKMB, CKMBINDEX, TROPONINI in the last 168 hours. BNP: Invalid input(s): POCBNP CBG:  Recent Labs Lab 09/04/14 1146 09/04/14 1638 09/04/14 2157 09/05/14 0753 09/05/14 1306  GLUCAP 139* 108* 108* 111* 130*    Recent Results (from the past 240 hour(s))  Culture, blood (routine x 2) Call MD if unable to obtain prior to antibiotics being given     Status: None (Preliminary result)   Collection Time: 09/03/14  1:27 AM  Result Value Ref Range Status   Specimen Description BLOOD RIGHT HAND  Final   Special Requests BOTTLES DRAWN AEROBIC AND ANAEROBIC 8CC  Final   Culture   Final           BLOOD CULTURE RECEIVED NO GROWTH TO DATE CULTURE WILL BE  HELD FOR 5 DAYS BEFORE ISSUING A FINAL NEGATIVE REPORT Performed at [facility]    Report Status PENDING  Incomplete  Culture, blood (routine x 2) Call MD if unable to obtain prior to antibiotics being given     Status: None (Preliminary result)   Collection Time: 09/03/14  2:03 AM  Result Value Ref Range Status   Specimen Description BLOOD RIGHT HAND  Final   Special Requests BOTTLES DRAWN AEROBIC AND ANAEROBIC 5CC  Final   Culture   Final           BLOOD CULTURE RECEIVED NO GROWTH TO DATE CULTURE WILL  BE HELD FOR 5 DAYS BEFORE ISSUING A FINAL NEGATIVE REPORT Performed at [facility]    Report Status PENDING  Incomplete  Culture, sputum-assessment     Status: None   Collection Time: 09/04/14  6:54 PM  Result Value Ref Range Status   Specimen Description SPUTUM  Final   Special Requests Normal  Final   Sputum evaluation   Final    THIS SPECIMEN IS ACCEPTABLE. RESPIRATORY CULTURE REPORT TO FOLLOW.   Report Status 09/04/2014 FINAL  Final  Culture, respiratory (NON-Expectorated)     Status: None (Preliminary result)   Collection Time: 09/04/14  6:54 PM  Result Value Ref Range Status   Specimen Description SPUTUM  Final   Special Requests NONE  Final   Gram Stain   Final    MODERATE WBC PRESENT, PREDOMINANTLY PMN FEW SQUAMOUS EPITHELIAL CELLS PRESENT FEW YEAST Performed at [facility]    Culture PENDING  Incomplete   Report Status PENDING  Incomplete     Scheduled Meds: . amiodarone  400 mg Oral BID  . azithromycin  500 mg Intravenous Q24H  . carvedilol  25 mg Oral BID WC  . cefTRIAXone (ROCEPHIN)  IV  1 g Intravenous Q24H  . colchicine  0.6 mg Oral BID  . digoxin  125 mcg Oral Daily  . insulin aspart  0-15 Units Subcutaneous TID WC  . lisinopril  5 mg Oral Daily  . simvastatin  10 mg Oral q1800   Continuous Infusions:

## 2014-09-06 DIAGNOSIS — E1122 Type 2 diabetes mellitus with diabetic chronic kidney disease: Secondary | ICD-10-CM

## 2014-09-06 DIAGNOSIS — N189 Chronic kidney disease, unspecified: Secondary | ICD-10-CM

## 2014-09-06 LAB — GLUCOSE, CAPILLARY
GLUCOSE-CAPILLARY: 102 mg/dL — AB (ref 70–99)
Glucose-Capillary: 152 mg/dL — ABNORMAL HIGH (ref 70–99)
Glucose-Capillary: 115 mg/dL — ABNORMAL HIGH (ref 70–99)
Glucose-Capillary: 110 mg/dL — ABNORMAL HIGH (ref 70–99)

## 2014-09-06 LAB — CBC
HEMATOCRIT: 30.2 % — AB (ref 39.0–52.0)
HEMOGLOBIN: 9.4 g/dL — AB (ref 13.0–17.0)
MCH: 26.3 pg (ref 26.0–34.0)
MCHC: 31.1 g/dL (ref 30.0–36.0)
MCV: 84.4 fL (ref 78.0–100.0)
Platelets: 211 10*3/uL (ref 150–400)
RBC: 3.58 MIL/uL — AB (ref 4.22–5.81)
RDW: 17 % — ABNORMAL HIGH (ref 11.5–15.5)
WBC: 6.3 10*3/uL (ref 4.0–10.5)

## 2014-09-06 NOTE — Progress Notes (Signed)
Patient ID: [name], male   DOB: [birth year], 74 y.o.   MRN: [id number] [facility] PROGRESS NOTE  [name] FGH:[id number] DOB: 12-Dec-1940 DOA: 09/02/2014 PCP: Scarlette Calico, MD  Brief narrative:    74 y.o. male with multiple cardiac comorbidities, history of DVT, dyslipidemia, diabetes, chronic kidney disease stage III who presented to [facility] hospital with complaints of hemoptysis. He is on anticoagulation with xarelto and in addition he takes daily aspirin. Hemoptysis started one day prior to this admission. In ED, patient was noted to have INR at 3.2. Hemoglobin was 10.8 at baseline is around 13. Patient was admitted for further evaluation and management. His chest x-ray was suspicious for pneumonia and he was started on azithromycin and Rocephin.   Assessment/Plan:    Principal Problem: Hemoptysis - Likely triggered by anticoagulation.  - Hemoptysis stopped. Use aspirin today. - If no hemoptysis in am then ok to d/c in am. - Hemoglobin stable.   Active Problems:  Type II diabetes mellitus with renal manifestations - A1c in February 2016 was 6.6 indicating good glycemic control.   Hyperlipidemia with target LDL less than 70 - Continue simvastatin 10 mg at bedtime.   Paroxysmal ventricular tachycardia - Continue carvedilol 25 mg twice daily, amiodarone and digoxin. - HR 78   Congestive dilated cardiomyopathy - Continue amiodarone, digoxin, carvedilol and lisinopril.   Chronic renal disease, stage III - Creatinine within normal limits.   DVT of axillary vein, acute left - [facility] has discussed with pulmonology and at this point patient has almost received a month of anticoagulation.  - Patient is at high risk of bleeding out from hemoptysis. AC on hold. Aspirin restarted.    CAP (community acquired pneumonia) - Continue azithromycin and Rocephin. - Blood cultures to date are negative.    Gout - Continue colchicine.   DVT Prophylaxis  - SCDs  bilaterally due to risk of bleeding   Code Status: DNR/DNI Family Communication:  plan of care discussed with the patient Disposition Plan: Hemoptysis now resolved. Aspirin today and if no bleeding then D/C tomorrow am.  IV access:  Peripheral IV  Procedures and diagnostic studies:    Dg Chest 2 View 09/02/2014   Stable right lower lobe probable pneumonia.     Dg Chest 2 View 09/02/2014   Possible mild right infrahilar opacity concerning for pneumonia. Followup radiographs are recommended.     Dg Chest 2 View 08/30/2014   1. Bilateral interstitial thickening concerning for an infectious etiology including atypical infections.   Electronically Signed   By: Kathreen Devoid   On: 08/30/2014 13:07   Ct Angio Chest Pe W/cm &/or Wo Cm 08/30/2014    No evidence of pulmonary embolism.  Moderate patchy bilateral airspace process most prominent over the right lower lobe compatible with a pneumonia. No evidence of effusion.  Mild cardiomegaly.  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   Electronically Signed   By: Marin Olp M.D.   On: 08/30/2014 14:05    Medical Consultants:  None   Other Consultants:  Physical therapy   IAnti-Infectives:   Azithromycin 09/02/2014 --> Rocephin 09/02/2014 -->   Leisa Lenz, MD  [facility] Pager [phone]  If 7PM-7AM, please contact night-coverage [email or web address] Password [email or web address] 09/06/2014, 2:14 PM   LOS: 4 day    HPI/Subjective: No acute overnight events.  Objective: Filed Vitals:   09/05/14 2122 09/06/14 0430 09/06/14 0919 09/06/14 0921  BP: 122/66 118/58  139/71  Pulse: 69 73 78   Temp: 98.1  F (36.7 C) 98.4 F (36.9 C)    TempSrc: Oral Oral    Resp: 20 20    Height:      Weight:  88.179 kg (194 lb 6.4 oz)    SpO2: 100% 99%      Intake/Output Summary (Last 24 hours) at 09/06/14 1414 Last data filed at 09/06/14 0914  Gross per 24 hour  Intake    480 ml  Output      0 ml  Net    480 ml    Exam:   General:  Pt is not in  distress  Cardiovascular: RRR, (+) S1,S2  Respiratory: no wheezing, no crackles   Abdomen: non tender, non distended, (+) BS  Extremities: No leg swelling, palpable pulses   Data Reviewed: Basic Metabolic Panel:  Recent Labs Lab 09/02/14 1414 09/02/14 1552 09/03/14 0419  NA 139 142 141  K 4.5 3.9 3.7  CL 106 108 108  CO2 31 24 24   GLUCOSE 96 99 112*  BUN 18 19 15   CREATININE 1.42 1.30 1.22  CALCIUM 8.9 8.8 8.5   Liver Function Tests:  Recent Labs Lab 09/02/14 1801 09/03/14 0419  AST 25 20  ALT 37 32  ALKPHOS 50 46  BILITOT 1.0 1.5*  PROT 6.9 6.4  ALBUMIN 3.6 3.2*   No results for input(s): LIPASE, AMYLASE in the last 168 hours. No results for input(s): AMMONIA in the last 168 hours. CBC:  Recent Labs Lab 09/02/14 1414 09/02/14 1552 09/03/14 0419 09/06/14 0547  WBC 8.5 8.8 8.2 6.3  NEUTROABS 6.1 6.4  --   --   HGB 10.8* 10.7* 9.5* 9.4*  HCT 33.1* 34.7* 30.2* 30.2*  MCV 82.8 84.6 84.1 84.4  PLT 266.0 245 219 211   Cardiac Enzymes: No results for input(s): CKTOTAL, CKMB, CKMBINDEX, TROPONINI in the last 168 hours. BNP: Invalid input(s): POCBNP CBG:  Recent Labs Lab 09/05/14 1306 09/05/14 1719 09/05/14 2127 09/06/14 0726 09/06/14 1136  GLUCAP 130* 105* 113* 102* 152*    Recent Results (from the past 240 hour(s))  Culture, blood (routine x 2) Call MD if unable to obtain prior to antibiotics being given     Status: None (Preliminary result)   Collection Time: 09/03/14  1:27 AM  Result Value Ref Range Status   Specimen Description BLOOD RIGHT HAND  Final   Special Requests BOTTLES DRAWN AEROBIC AND ANAEROBIC 8CC  Final   Culture   Final           BLOOD CULTURE RECEIVED NO GROWTH TO DATE CULTURE WILL BE HELD FOR 5 DAYS BEFORE ISSUING A FINAL NEGATIVE REPORT Performed at [facility]    Report Status PENDING  Incomplete  Culture, blood (routine x 2) Call MD if unable to obtain prior to antibiotics being given     Status: None  (Preliminary result)   Collection Time: 09/03/14  2:03 AM  Result Value Ref Range Status   Specimen Description BLOOD RIGHT HAND  Final   Special Requests BOTTLES DRAWN AEROBIC AND ANAEROBIC 5CC  Final   Culture   Final           BLOOD CULTURE RECEIVED NO GROWTH TO DATE CULTURE WILL BE HELD FOR 5 DAYS BEFORE ISSUING A FINAL NEGATIVE REPORT Performed at [facility]    Report Status PENDING  Incomplete  Culture, sputum-assessment     Status: None   Collection Time: 09/04/14  6:54 PM  Result Value Ref Range Status   Specimen Description SPUTUM  Final   Special Requests Normal  Final   Sputum evaluation   Final    THIS SPECIMEN IS ACCEPTABLE. RESPIRATORY CULTURE REPORT TO FOLLOW.   Report Status 09/04/2014 FINAL  Final  Culture, respiratory (NON-Expectorated)     Status: None (Preliminary result)   Collection Time: 09/04/14  6:54 PM  Result Value Ref Range Status   Specimen Description SPUTUM  Final   Special Requests NONE  Final   Gram Stain   Final    MODERATE WBC PRESENT, PREDOMINANTLY PMN FEW SQUAMOUS EPITHELIAL CELLS PRESENT FEW YEAST Performed at [facility]    Culture   Final    ABUNDANT CANDIDA ALBICANS Performed at [facility]    Report Status PENDING  Incomplete     Scheduled Meds: . amiodarone  400 mg Oral BID  . aspirin EC  81 mg Oral Daily  . azithromycin  500 mg Intravenous Q24H  . carvedilol  25 mg Oral BID WC  . cefTRIAXone (ROCEPHIN)  IV  1 g Intravenous Q24H  . colchicine  0.6 mg Oral BID  . digoxin  125 mcg Oral Daily  . insulin aspart  0-15 Units Subcutaneous TID WC  . lisinopril  5 mg Oral Daily  . simvastatin  10 mg Oral q1800   Continuous Infusions:

## 2014-09-07 ENCOUNTER — Telehealth: Payer: Self-pay | Admitting: Internal Medicine

## 2014-09-07 ENCOUNTER — Ambulatory Visit: Payer: Federal, State, Local not specified - PPO | Admitting: Internal Medicine

## 2014-09-07 DIAGNOSIS — E1121 Type 2 diabetes mellitus with diabetic nephropathy: Secondary | ICD-10-CM

## 2014-09-07 LAB — CULTURE, RESPIRATORY W GRAM STAIN

## 2014-09-07 LAB — CULTURE, RESPIRATORY

## 2014-09-07 LAB — GLUCOSE, CAPILLARY
GLUCOSE-CAPILLARY: 105 mg/dL — AB (ref 70–99)
Glucose-Capillary: 132 mg/dL — ABNORMAL HIGH (ref 70–99)

## 2014-09-07 MED ORDER — LEVOFLOXACIN 500 MG PO TABS: 500.0000 mg | ORAL_TABLET | Freq: Every day | ORAL | Status: DC

## 2014-09-07 NOTE — Discharge Instructions (Signed)
Hemoptysis  Hemoptysis, which means coughing up blood, can be a sign of a minor problem or a serious medical condition. The blood that is coughed up may come from the lungs and airways. Coughed-up blood can also come from bleeding that occurs outside the lungs and airways. Blood can drain into the windpipe during a severe nosebleed or when blood is vomited from the stomach. Because hemoptysis can be a sign of something serious, a medical evaluation is required. For some people with hemoptysis, no definite cause is ever identified.  CAUSES   The most common cause of hemoptysis is bronchitis. Some other common causes include:    A ruptured blood vessel caused by coughing or an infection.    A medical condition that causes damage to the large air passageways (bronchiectasis).    A blood clot in the lungs (pulmonary embolism).    Pneumonia.    Tuberculosis.    Breathing in a small foreign object.    Cancer.  For some people with hemoptysis, no definite cause is ever identified.   HOME CARE INSTRUCTIONS   Only take over-the-counter or prescription medicines as directed by your caregiver. Do not use cough suppressants unless your caregiver approves.   If your caregiver prescribes antibiotic medicines, take them as directed. Finish them even if you start to feel better.   Do not smoke. Also avoid secondhand smoke.   Follow up with your caregiver as directed.  SEEK IMMEDIATE MEDICAL CARE IF:    You cough up bloody mucus for longer than a week.   You have a blood-producing cough that is severe or getting worse.   You have a blood-producing cough thatcomes and goes over time.   You develop problems with your breathing.    You vomit blood.   You develop bloody or black-colored stools.   You have chest pain.    You develop night sweats.   You feel faint or pass out.    You have a fever or persistent symptoms for more than 2-3 days.   You have a fever and your symptoms suddenly get worse.  MAKE  SURE YOU:   Understand these instructions.   Will watch your condition.   Will get help right away if you are not doing well or get worse.  Document Released: 08/14/2001 Document Revised: 05/22/2012 Document Reviewed: 03/22/2012  ExitCare Patient Information 2015 [employer]. This information is not intended to replace advice given to you by your health care provider. Make sure you discuss any questions you have with your health care provider.

## 2014-09-07 NOTE — Discharge Summary (Signed)
Physician Discharge Summary  [name] ZOX:[id number] DOB: August 18, 1940 DOA: 09/02/2014  PCP: Scarlette Calico, MD  Admit date: 09/02/2014 Discharge date: 09/07/2014  Recommendations for Outpatient Follow-up:  Per critical care MD patient at risk for hemoptysis and has completed 1 month of anticoagulation with xarelto.  Will continue aspirin on discharge. Xarelto on hold. Please follow up with PCP and see should xarelto be restarted in next 1 week if no further hemoptysis. It may be worthwhile repeating upper extremity DVT and if new clot formation pt needs to evaluated for potential long term anticoagulation therapy. I spoke with his PCP who agrees with this and will follow up outpt.     Discharge Diagnoses:  Principal Problem:   Hemoptysis Active Problems:   Type II diabetes mellitus with renal manifestations   Hyperlipidemia with target LDL less than 70   Paroxysmal ventricular tachycardia   Congestive dilated cardiomyopathy   Chronic renal disease, stage III   DVT of axillary vein, acute left   CAP (community acquired pneumonia)    Discharge Condition: stable   Diet recommendation: as tolerated   History of present illness:  74 y.o. male with multiple cardiac comorbidities, history of DVT, dyslipidemia, diabetes, chronic kidney disease stage III who presented to [facility] with complaints of hemoptysis. He is on anticoagulation with xarelto and in addition he takes daily aspirin. Hemoptysis started one day prior to this admission. In ED, patient was noted to have INR at 3.2. Hemoglobin was 10.8 at baseline is around 13. Patient was admitted for further evaluation and management. His chest x-ray was suspicious for pneumonia and he was started on azithromycin and Rocephin.   Assessment/Plan:    Principal Problem: Hemoptysis - Likely triggered by anticoagulation.  - Hemoptysis stopped.  - As mentioned above, aspirin on discharge. Xarelto on hold. - Hemoglobin  stable.   Active Problems:  Type II diabetes mellitus with renal manifestations - A1c in February 2016 was 6.6 indicating good glycemic control.   Hyperlipidemia with target LDL less than 70 - Continue simvastatin 10 mg at bedtime on discharge    Paroxysmal ventricular tachycardia - Continue carvedilol 25 mg twice daily, amiodarone and digoxin.   Congestive dilated cardiomyopathy - Continue amiodarone, digoxin, carvedilol and lisinopril.   Chronic renal disease, stage III - Creatinine within normal limits.   DVT of axillary vein, acute left - TRH has discussed with pulmonology and at this point patient has almost received a month of anticoagulation.  - Patient is at high risk of bleeding out from hemoptysis. AC on hold. Aspirin restarted.    CAP (community acquired pneumonia) - Was on azithromycin and Rocephin since admission. Levaquin on discharge as prescribed  - Blood cultures to date are negative.   Gout - stable. Pt said he will f/u with PCP in regards to starting colchicine. He preferred to have this started outpt.  DVT Prophylaxis  - SCDs bilaterally due to risk of bleeding   Code Status: DNR/DNI Family Communication: plan of care discussed with the patient   IV access:  Peripheral IV  Procedures and diagnostic studies:   Dg Chest 2 View 09/02/2014 Stable right lower lobe probable pneumonia.   Dg Chest 2 View 09/02/2014 Possible mild right infrahilar opacity concerning for pneumonia. Followup radiographs are recommended.   Dg Chest 2 View 08/30/2014 1. Bilateral interstitial thickening concerning for an infectious etiology including atypical infections. Electronically Signed By: Kathreen Devoid On: 08/30/2014 13:07   Ct Angio Chest Pe W/cm &/or Wo Cm  08/30/2014 No evidence of pulmonary embolism. Moderate patchy bilateral airspace process most prominent over the right lower lobe compatible with a pneumonia. No evidence of effusion.  Mild cardiomegaly. 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 Electronically Signed By: Marin Olp M.D. On: 08/30/2014 14:05    Medical Consultants:  None   Other Consultants:  Physical therapy   IAnti-Infectives:   Azithromycin 09/02/2014 --> 09/07/2014  Rocephin 09/02/2014 --> 09/07/2014    Signed:  Leisa Lenz, MD  [facility] 09/07/2014, 3:10 PM  Pager #: [phone]   Discharge Exam: Filed Vitals:   09/07/14 1315  BP: 106/54  Pulse: 59  Temp: 97.8 F (36.6 C)  Resp: 16   Filed Vitals:   09/06/14 2129 09/07/14 0527 09/07/14 0908 09/07/14 1315  BP: 125/66 112/60 127/69 106/54  Pulse: 70 68  59  Temp: 98 F (36.7 C) 98.6 F (37 C)  97.8 F (36.6 C)  TempSrc: Oral Oral  Oral  Resp: 20 20  16   Height:      Weight:  87.68 kg (193 lb 4.8 oz)    SpO2: 100% 96%  100%    General: Pt is alert, follows commands appropriately, not in acute distress Cardiovascular: Regular rate and rhythm, S1/S2 +, no murmurs Respiratory: Clear to auscultation bilaterally, no wheezing, no crackles, no rhonchi Abdominal: Soft, non tender, non distended, bowel sounds +, no guarding Extremities: no edema, no cyanosis, pulses palpable bilaterally DP and PT Neuro: Grossly nonfocal  Discharge Instructions  Discharge Instructions    Call MD for:  difficulty breathing, headache or visual disturbances    Complete by:  As directed      Call MD for:  persistant nausea and vomiting    Complete by:  As directed      Call MD for:  severe uncontrolled pain    Complete by:  As directed      Diet - low sodium heart healthy    Complete by:  As directed      Discharge instructions    Complete by:  As directed   Per critical care MD patient at risk for hemoptysis and has completed 1 month of anticoagulation with xarelto.  Will continue aspirin on discharge. Xarelto on hold. Please follow up with PCP and see should xarelto be restarted in next 1 week if no further hemoptysis.      Increase activity slowly    Complete by:  As directed             Medication List    STOP taking these medications        Rivaroxaban 15 MG Tabs tablet  Commonly known as:  XARELTO      TAKE these medications        acetaminophen 325 MG tablet  Commonly known as:  TYLENOL  Take 2 tablets (650 mg total) by mouth every 4 (four) hours as needed for headache or mild pain.     amiodarone 200 MG tablet  Commonly known as:  PACERONE  Take 1 tablet (200 mg total) by mouth 2 (two) times daily.     aspirin 81 MG tablet  Take 81 mg by mouth daily.     carvedilol 25 MG tablet  Commonly known as:  COREG  Take 25 mg by mouth 2 (two) times daily with a meal.     COLCRYS 0.6 MG tablet  Generic drug:  colchicine  Take 0.6 mg by mouth 2 (two) times daily as needed (for gout symptoms).  digoxin 0.125 MG tablet  Commonly known as:  LANOXIN  Take 125 mcg by mouth daily.     furosemide 40 MG tablet  Commonly known as:  LASIX  Take 40 mg by mouth daily. Take daily as directed     HYDROcodone-acetaminophen 10-325 MG per tablet  Commonly known as:  NORCO  Take 1 tablet by mouth every 8 (eight) hours as needed.     levofloxacin 500 MG tablet  Commonly known as:  LEVAQUIN  Take 1 tablet (500 mg total) by mouth daily.     Linaclotide 145 MCG Caps capsule  Commonly known as:  LINZESS  Take 1 capsule (145 mcg total) by mouth daily.     lisinopril 5 MG tablet  Commonly known as:  PRINIVIL,ZESTRIL  Take 5 mg by mouth daily.     Methotrexate Sodium (PF) 50 MG/2ML Soln  Inject 0.8 mLs as directed once a week. Take on sundays     simvastatin 20 MG tablet  Commonly known as:  ZOCOR  Take 10 mg by mouth daily.     TRADJENTA 5 MG Tabs tablet  Generic drug:  linagliptin  TAKE 1 TABLET BY MOUTH DAILY FOR DIABETES     ULORIC 80 MG Tabs  Generic drug:  Febuxostat  TAKE 1 TABLET (80 MG TOTAL) BY MOUTH DAILY.     Vitamin D-3 1000 UNITS Caps  Take 1,000 Units by mouth daily.             Follow-up Information    Follow up with Scarlette Calico, MD. Schedule an appointment as soon as possible for a visit in 1 week.   Specialty:  Internal Medicine   Why:  Follow up appt after recent hospitalization   Contact information:   [address] [place] [address] [phone]        The results of significant diagnostics from this hospitalization (including imaging, microbiology, ancillary and laboratory) are listed below for reference.    Significant Diagnostic Studies: Dg Chest 2 View  09/02/2014   CLINICAL DATA:  Shortness of breath for the past 2 weeks. Hemoptysis for the past 3 days. Ex-smoker.  EXAM: CHEST  2 VIEW  COMPARISON:  Earlier today at [facility].  FINDINGS: The cardiac silhouette remains borderline enlarged. Stable left subclavian AICD lead. Patchy airspace opacity is again demonstrated at the medial right lung base with preservation of the right heart border. The left lung remains clear. Lower thoracic spine degenerative changes.  IMPRESSION: Stable right lower lobe probable pneumonia.   Electronically Signed   By: Claudie Revering M.D.   On: 09/02/2014 16:53   Dg Chest 2 View  09/02/2014   CLINICAL DATA:  Hemoptysis.  EXAM: CHEST  2 VIEW  COMPARISON:  August 30, 2014.  FINDINGS: The heart size and mediastinal contours are within normal limits. Left lung is clear. Possible mild right infrahilar opacity is noted which may represent focal inflammation. No pneumothorax or pleural effusion is noted. Left-sided pacemaker is unchanged in position. The visualized skeletal structures are unremarkable.  IMPRESSION: Possible mild right infrahilar opacity concerning for pneumonia. Followup radiographs are recommended.   Electronically Signed   By: Marijo Conception, M.D.   On: 09/02/2014 13:59   Dg Chest 2 View  08/30/2014   CLINICAL DATA:  Dry cough, shortness of breath, hemoptysis  EXAM: CHEST  2 VIEW  COMPARISON:  08/23/2014  FINDINGS: There is  bilateral interstitial thickening. There is no pleural effusion or pneumothorax. There is  stable cardiomegaly. There is a single lead cardiac pacer.  The osseous structures are unremarkable.  IMPRESSION: 1. Bilateral interstitial thickening concerning for an infectious etiology including atypical infections.   Electronically Signed   By: Kathreen Devoid   On: 08/30/2014 13:07   Dg Chest 2 View  08/23/2014   CLINICAL DATA:  Shortness of breath  EXAM: CHEST  2 VIEW  COMPARISON:  07/28/2014  FINDINGS: Stable cardiomegaly. A mildly redundant single chamber ICD/ pacer lead into the right ventricle has an unchanged appearance. Mild aortic tortuosity is also stable.  There is pulmonary venous congestion without edema. No effusion or pneumothorax. No evidence for pneumonia.  IMPRESSION: Cardiomegaly and pulmonary venous hypertension.   Electronically Signed   By: Monte Fantasia M.D.   On: 08/23/2014 06:43   Ct Angio Chest Pe W/cm &/or Wo Cm  08/30/2014   CLINICAL DATA:  Cough and congestion 5 days.  EXAM: CT ANGIOGRAPHY CHEST WITH CONTRAST  TECHNIQUE: Multidetector CT imaging of the chest was performed using the standard protocol during bolus administration of intravenous contrast. Multiplanar CT image reconstructions and MIPs were obtained to evaluate the vascular anatomy.  CONTRAST:  176mL OMNIPAQUE IOHEXOL 350 MG/ML SOLN  COMPARISON:  Chest x-ray 08/30/2014  FINDINGS: Left-sided pacemaker is present. Lungs are adequately inflated and demonstrate a moderate airspace process involving the right lower lobe and minimally over the left lower lobe likely pneumonia. No evidence of effusion. Airways are within normal.  Mild cardiomegaly. 1 cm lymph node adjacent the distal esophagus. There is no evidence of pulmonary embolism. No evidence of mediastinal or axillary adenopathy.  Images through the upper abdomen demonstrate a few small lymph nodes in the region the gastrohepatic ligament and porta hepatis. There is a 1.3 cm  right adrenal adenoma. The remaining bones and soft tissues are within normal.  Review of the MIP images confirms the above findings.  IMPRESSION: No evidence of pulmonary embolism.  Moderate patchy bilateral airspace process most prominent over the right lower lobe compatible with a pneumonia. No evidence of effusion.  Mild cardiomegaly.  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   Electronically Signed   By: Marin Olp M.D.   On: 08/30/2014 14:05    Microbiology: Recent Results (from the past 240 hour(s))  Culture, blood (routine x 2) Call MD if unable to obtain prior to antibiotics being given     Status: None (Preliminary result)   Collection Time: 09/03/14  1:27 AM  Result Value Ref Range Status   Specimen Description BLOOD RIGHT HAND  Final   Special Requests BOTTLES DRAWN AEROBIC AND ANAEROBIC 8CC  Final   Culture   Final           BLOOD CULTURE RECEIVED NO GROWTH TO DATE CULTURE WILL BE HELD FOR 5 DAYS BEFORE ISSUING A FINAL NEGATIVE REPORT Performed at [facility]    Report Status PENDING  Incomplete  Culture, blood (routine x 2) Call MD if unable to obtain prior to antibiotics being given     Status: None (Preliminary result)   Collection Time: 09/03/14  2:03 AM  Result Value Ref Range Status   Specimen Description BLOOD RIGHT HAND  Final   Special Requests BOTTLES DRAWN AEROBIC AND ANAEROBIC 5CC  Final   Culture   Final           BLOOD CULTURE RECEIVED NO GROWTH TO DATE CULTURE WILL BE HELD FOR 5 DAYS BEFORE ISSUING A FINAL NEGATIVE REPORT Performed at [facility]  Report Status PENDING  Incomplete  Culture, sputum-assessment     Status: None   Collection Time: 09/04/14  6:54 PM  Result Value Ref Range Status   Specimen Description SPUTUM  Final   Special Requests Normal  Final   Sputum evaluation   Final    THIS SPECIMEN IS ACCEPTABLE. RESPIRATORY CULTURE REPORT TO FOLLOW.   Report Status 09/04/2014 FINAL  Final  Culture, respiratory (NON-Expectorated)      Status: None   Collection Time: 09/04/14  6:54 PM  Result Value Ref Range Status   Specimen Description SPUTUM  Final   Special Requests NONE  Final   Gram Stain   Final    MODERATE WBC PRESENT, PREDOMINANTLY PMN FEW SQUAMOUS EPITHELIAL CELLS PRESENT FEW YEAST Performed at [facility]    Culture   Final    ABUNDANT CANDIDA ALBICANS Performed at [facility]    Report Status 09/07/2014 FINAL  Final     Labs: Basic Metabolic Panel:  Recent Labs Lab 09/02/14 1414 09/02/14 1552 09/03/14 0419  NA 139 142 141  K 4.5 3.9 3.7  CL 106 108 108  CO2 31 24 24   GLUCOSE 96 99 112*  BUN 18 19 15   CREATININE 1.42 1.30 1.22  CALCIUM 8.9 8.8 8.5   Liver Function Tests:  Recent Labs Lab 09/02/14 1801 09/03/14 0419  AST 25 20  ALT 37 32  ALKPHOS 50 46  BILITOT 1.0 1.5*  PROT 6.9 6.4  ALBUMIN 3.6 3.2*   No results for input(s): LIPASE, AMYLASE in the last 168 hours. No results for input(s): AMMONIA in the last 168 hours. CBC:  Recent Labs Lab 09/02/14 1414 09/02/14 1552 09/03/14 0419 09/06/14 0547  WBC 8.5 8.8 8.2 6.3  NEUTROABS 6.1 6.4  --   --   HGB 10.8* 10.7* 9.5* 9.4*  HCT 33.1* 34.7* 30.2* 30.2*  MCV 82.8 84.6 84.1 84.4  PLT 266.0 245 219 211   Cardiac Enzymes: No results for input(s): CKTOTAL, CKMB, CKMBINDEX, TROPONINI in the last 168 hours. BNP: BNP (last 3 results)  Recent Labs  08/13/14 0257 08/23/14 0557 08/30/14 1207  BNP 372.0* 233.7* 220.8*    ProBNP (last 3 results) No results for input(s): PROBNP in the last 8760 hours.  CBG:  Recent Labs Lab 09/06/14 1136 09/06/14 1636 09/06/14 2126 09/07/14 0730 09/07/14 1135  GLUCAP 152* 115* 110* 105* 132*    Time coordinating discharge: Over 30 minutes

## 2014-09-07 NOTE — Progress Notes (Signed)
Patient discharged home, discharge instructions given and explained to patient and he verbalized understanding, patient denies any pain/distress, accompanied home by wife, transported to the car by staff via wheelchair. No wound noted, skin intact.

## 2014-09-08 NOTE — Telephone Encounter (Signed)
error 

## 2014-09-09 ENCOUNTER — Telehealth: Payer: Self-pay | Admitting: *Deleted

## 2014-09-09 LAB — CULTURE, BLOOD (ROUTINE X 2)
CULTURE: NO GROWTH
Culture: NO GROWTH

## 2014-09-09 NOTE — Telephone Encounter (Signed)
Transition Care Management Follow-up Telephone Call  How have you been since you were released from the hospital? Pt states he is doing ok   Do you understand why you were in the hospital? YES   Do you understand the discharge instrcutions? YES  Items Reviewed:  Medications reviewed: YES  Allergies reviewed: YES  Dietary changes reviewed: NO  Referrals reviewed: No referrals needed   Functional Questionnaire:   Activities of Daily Living (ADLs):   He states they are independent in the following: ambulation, bathing and hygiene, feeding, continence, grooming, toileting and dressing He states he doesn't require assistance    Any transportation issues/concerns?: NO   Any patient concerns? NO   Confirmed importance and date/time of follow-up visits scheduled: YES, appt has been made for 09/14/14 w/Dr. Ronnald Ramp   Confirmed with patient if condition begins to worsen call PCP or go to the ER.

## 2014-09-10 ENCOUNTER — Other Ambulatory Visit: Payer: Self-pay | Admitting: *Deleted

## 2014-09-10 NOTE — Patient Outreach (Signed)
   09/10/2014   [name] [birth year]  [id number]   Referral received from long length of stay MD Director for [place] Management services. Attempted to reach patient on home phone to discuss [place] [facility]. Patient's wife reports patient is not available at this time. Suggests writer call back at later time. Writer also called patient on cell phone without success. Will attempt to reach patient at later time to discuss [place] Management services.   Marthenia Rolling, MSN-Ed, RN,BSN [facility] Liaison [phone]

## 2014-09-14 ENCOUNTER — Other Ambulatory Visit (INDEPENDENT_AMBULATORY_CARE_PROVIDER_SITE_OTHER): Payer: Medicare Other

## 2014-09-14 ENCOUNTER — Encounter: Payer: Self-pay | Admitting: Internal Medicine

## 2014-09-14 ENCOUNTER — Ambulatory Visit (INDEPENDENT_AMBULATORY_CARE_PROVIDER_SITE_OTHER): Payer: Medicare Other | Admitting: Internal Medicine

## 2014-09-14 ENCOUNTER — Ambulatory Visit (INDEPENDENT_AMBULATORY_CARE_PROVIDER_SITE_OTHER)
Admission: RE | Admit: 2014-09-14 | Discharge: 2014-09-14 | Disposition: A | Discharge status: Home / Self Care | Payer: Medicare Other | Source: Ambulatory Visit | Attending: Internal Medicine | Admitting: Internal Medicine

## 2014-09-14 VITALS — BP 118/62 | HR 66 | Temp 97.9°F | Resp 16 | Ht 69.0 in | Wt 193.0 lb

## 2014-09-14 DIAGNOSIS — R042 Hemoptysis: Secondary | ICD-10-CM

## 2014-09-14 DIAGNOSIS — J849 Interstitial pulmonary disease, unspecified: Secondary | ICD-10-CM | POA: Diagnosis not present

## 2014-09-14 DIAGNOSIS — I808 Phlebitis and thrombophlebitis of other sites: Secondary | ICD-10-CM | POA: Diagnosis not present

## 2014-09-14 DIAGNOSIS — Z23 Encounter for immunization: Secondary | ICD-10-CM | POA: Diagnosis not present

## 2014-09-14 DIAGNOSIS — D689 Coagulation defect, unspecified: Secondary | ICD-10-CM

## 2014-09-14 DIAGNOSIS — D500 Iron deficiency anemia secondary to blood loss (chronic): Secondary | ICD-10-CM

## 2014-09-14 DIAGNOSIS — Z0000 Encounter for general adult medical examination without abnormal findings: Secondary | ICD-10-CM

## 2014-09-14 DIAGNOSIS — I82A12 Acute embolism and thrombosis of left axillary vein: Secondary | ICD-10-CM

## 2014-09-14 LAB — CBC WITH DIFFERENTIAL/PLATELET
Basophils Absolute: 0.1 10*3/uL (ref 0.0–0.1)
Basophils Relative: 1 % (ref 0.0–3.0)
Eosinophils Absolute: 0.2 10*3/uL (ref 0.0–0.7)
Eosinophils Relative: 3.2 % (ref 0.0–5.0)
HEMATOCRIT: 34 % — AB (ref 39.0–52.0)
Hemoglobin: 11.2 g/dL — ABNORMAL LOW (ref 13.0–17.0)
LYMPHS ABS: 1 10*3/uL (ref 0.7–4.0)
Lymphocytes Relative: 18.5 % (ref 12.0–46.0)
MCHC: 32.9 g/dL (ref 30.0–36.0)
MCV: 81.9 fl (ref 78.0–100.0)
MONO ABS: 0.6 10*3/uL (ref 0.1–1.0)
Monocytes Relative: 12 % (ref 3.0–12.0)
Neutro Abs: 3.5 10*3/uL (ref 1.4–7.7)
Neutrophils Relative %: 65.3 % (ref 43.0–77.0)
PLATELETS: 215 10*3/uL (ref 150.0–400.0)
RBC: 4.15 Mil/uL — ABNORMAL LOW (ref 4.22–5.81)
RDW: 18.9 % — ABNORMAL HIGH (ref 11.5–15.5)
WBC: 5.4 10*3/uL (ref 4.0–10.5)

## 2014-09-14 NOTE — Patient Instructions (Signed)
Cough, Adult  A cough is a reflex that helps clear your throat and airways. It can help heal the body or may be a reaction to an irritated airway. A cough may only last 2 or 3 weeks (acute) or may last more than 8 weeks (chronic).  CAUSES Acute cough:  Viral or bacterial infections. Chronic cough:  Infections.  Allergies.  Asthma.  Post-nasal drip.  Smoking.  Heartburn or acid reflux.  Some medicines.  Chronic lung problems (COPD).  Cancer. SYMPTOMS   Cough.  Fever.  Chest pain.  Increased breathing rate.  High-pitched whistling sound when breathing (wheezing).  Colored mucus that you cough up (sputum). TREATMENT   A bacterial cough may be treated with antibiotic medicine.  A viral cough must run its course and will not respond to antibiotics.  Your caregiver may recommend other treatments if you have a chronic cough. HOME CARE INSTRUCTIONS   Only take over-the-counter or prescription medicines for pain, discomfort, or fever as directed by your caregiver. Use cough suppressants only as directed by your caregiver.  Use a cold steam vaporizer or humidifier in your bedroom or home to help loosen secretions.  Sleep in a semi-upright position if your cough is worse at night.  Rest as needed.  Stop smoking if you smoke. SEEK IMMEDIATE MEDICAL CARE IF:   You have pus in your sputum.  Your cough starts to worsen.  You cannot control your cough with suppressants and are losing sleep.  You begin coughing up blood.  You have difficulty breathing.  You develop pain which is getting worse or is uncontrolled with medicine.  You have a fever. MAKE SURE YOU:   Understand these instructions.  Will watch your condition.  Will get help right away if you are not doing well or get worse. Document Released: 12/02/2010 Document Revised: 08/28/2011 Document Reviewed: 12/02/2010 Exit[employer] Patient Information 2015 [employer]. This information is not intended  to replace advice given to you by your health care provider. Make sure you discuss any questions you have with your health care provider.  

## 2014-09-14 NOTE — Progress Notes (Signed)
Pre visit review using our clinic review tool, if applicable. No additional management support is needed unless otherwise documented below in the visit note. 

## 2014-09-14 NOTE — Progress Notes (Signed)
Subjective:    Patient ID: [name], male    DOB: Feb 12, 1941, 74 y.o.   MRN: [id number]  Cough This is a recurrent problem. The current episode started 1 to 4 weeks ago. The problem has been resolved. Pertinent negatives include no chest pain, chills, ear congestion, ear pain, fever, headaches, heartburn, hemoptysis, myalgias, nasal congestion, postnasal drip, rash, rhinorrhea, sore throat, shortness of breath, sweats, weight loss or wheezing. His past medical history is significant for pneumonia.      Review of Systems  Constitutional: Negative.  Negative for fever, chills, weight loss, diaphoresis, appetite change and fatigue.  HENT: Negative.  Negative for ear pain, nosebleeds, postnasal drip, rhinorrhea, sore throat and trouble swallowing.   Eyes: Negative.  Negative for visual disturbance.  Respiratory: Positive for cough. Negative for apnea, hemoptysis, choking, chest tightness, shortness of breath, wheezing and stridor.   Cardiovascular: Negative.  Negative for chest pain, palpitations and leg swelling.  Gastrointestinal: Negative.  Negative for heartburn, nausea, vomiting, abdominal pain, diarrhea, constipation and blood in stool.  Endocrine: Negative.   Genitourinary: Negative.  Negative for dysuria, hematuria and enuresis.  Musculoskeletal: Positive for arthralgias. Negative for myalgias, back pain and neck pain.  Skin: Negative.  Negative for rash.  Allergic/Immunologic: Negative.   Neurological: Negative.  Negative for dizziness and headaches.  Hematological: Negative.  Negative for adenopathy. Does not bruise/bleed easily.  Psychiatric/Behavioral: Negative.        Objective:   Physical Exam  Constitutional: He is oriented to person, place, and time. He appears well-developed and well-nourished.  Non-toxic appearance. He does not have a sickly appearance. He does not appear ill. No distress.  HENT:  Head: Normocephalic and atraumatic.  Mouth/Throat: Oropharynx is  clear and moist. No oropharyngeal exudate.  Eyes: Conjunctivae are normal. Right eye exhibits no discharge. Left eye exhibits no discharge. No scleral icterus.  Neck: Normal range of motion. Neck supple. No JVD present. No tracheal deviation present. No thyromegaly present.  Cardiovascular: Normal rate, regular rhythm, S1 normal, S2 normal and intact distal pulses.  Exam reveals no gallop, no S3, no S4 and no friction rub.   Murmur heard.  Decrescendo systolic murmur is present with a grade of 2/6   No diastolic murmur is present  Pulmonary/Chest: Effort normal and breath sounds normal. No stridor. No respiratory distress. He has no wheezes. He has no rales. He exhibits no tenderness.  Abdominal: Soft. Bowel sounds are normal. He exhibits no distension and no mass. There is no tenderness. There is no rebound and no guarding.  Musculoskeletal: Normal range of motion. He exhibits no edema or tenderness.  Left upper arm is negative for cords, swelling, tenderness, warmth  Lymphadenopathy:    He has no cervical adenopathy.  Neurological: He is oriented to person, place, and time.  Skin: Skin is warm and dry. No rash noted. He is not diaphoretic. No erythema. No pallor.  Vitals reviewed.    Lab Results  Component Value Date   WBC 6.3 09/06/2014   HGB 9.4* 09/06/2014   HCT 30.2* 09/06/2014   PLT 211 09/06/2014   GLUCOSE 112* 09/03/2014   CHOL 117 08/13/2014   TRIG 48 08/13/2014   HDL 40 08/13/2014   LDLCALC 67 08/13/2014   ALT 32 09/03/2014   AST 20 09/03/2014   NA 141 09/03/2014   K 3.7 09/03/2014   CL 108 09/03/2014   CREATININE 1.22 09/03/2014   BUN 15 09/03/2014   CO2 24 09/03/2014   TSH 0.827  08/13/2014   PSA <0.01 ng/mL* 11/09/2008   INR 3.08* 09/02/2014   HGBA1C 6.6* 07/28/2014   MICROALBUR 1.0 07/28/2014       Assessment & Plan:

## 2014-09-15 DIAGNOSIS — Z0000 Encounter for general adult medical examination without abnormal findings: Secondary | ICD-10-CM | POA: Insufficient documentation

## 2014-09-15 NOTE — Assessment & Plan Note (Signed)
His cough has resolved, exam is normal, CXR shows resolution This has resolved

## 2014-09-15 NOTE — Assessment & Plan Note (Addendum)
He has had a LUE DVT and his PT/PTT were both elevated despite being only on xarelto I am concerned that he has an acquired coagulopathy - will check the results of a hypercoag panel

## 2014-09-15 NOTE — Assessment & Plan Note (Signed)
This was caused by his recent blood loss (hemoptysis) Improvement noted on the CBC today

## 2014-09-15 NOTE — Assessment & Plan Note (Signed)

## 2014-09-15 NOTE — Assessment & Plan Note (Signed)
This has resolved He has had some complication from anticoagulation so will follow off of xarelto for now

## 2014-09-17 ENCOUNTER — Other Ambulatory Visit: Payer: Self-pay

## 2014-09-17 LAB — HYPERCOAGULABLE PANEL, COMPREHENSIVE
ANTICARDIOLIPIN IGG: 6 GPL U/mL (ref <23)
ANTITHROMB III FUNC: 103 % (ref 76–126)
Anticardiolipin IgA: 1 APL U/mL (ref <22)
Anticardiolipin IgM: 3 MPL U/mL (ref <11)
BETA 2 GLYCO I IGG: 8 G Units (ref <20)
BETA-2-GLYCOPROTEIN I IGA: 31 A Units — AB (ref <20)
BETA-2-GLYCOPROTEIN I IGM: 8 M Units (ref <20)
DRVVT: 36.9 secs (ref <42.9)
LUPUS ANTICOAGULANT: NOT DETECTED
PTT Lupus Anticoagulant: 38 secs (ref 28.0–43.0)
Protein C Activity: 133 % (ref 75–133)
Protein C, Total: 91 % (ref 72–160)
Protein S Activity: 134 % — ABNORMAL HIGH (ref 69–129)
Protein S Total: 108 % (ref 60–150)

## 2014-09-19 ENCOUNTER — Emergency Department (HOSPITAL_COMMUNITY)
Admission: EM | Admit: 2014-09-19 | Discharge: 2014-09-20 | Disposition: A | Discharge status: Home / Self Care | Payer: Medicare Other | Attending: Emergency Medicine | Admitting: Emergency Medicine

## 2014-09-19 ENCOUNTER — Encounter (HOSPITAL_COMMUNITY): Payer: Self-pay | Admitting: *Deleted

## 2014-09-19 ENCOUNTER — Emergency Department (HOSPITAL_COMMUNITY): Payer: Medicare Other

## 2014-09-19 DIAGNOSIS — R079 Chest pain, unspecified: Secondary | ICD-10-CM | POA: Insufficient documentation

## 2014-09-19 DIAGNOSIS — Z8546 Personal history of malignant neoplasm of prostate: Secondary | ICD-10-CM | POA: Diagnosis not present

## 2014-09-19 DIAGNOSIS — Z79899 Other long term (current) drug therapy: Secondary | ICD-10-CM | POA: Insufficient documentation

## 2014-09-19 DIAGNOSIS — E669 Obesity, unspecified: Secondary | ICD-10-CM | POA: Insufficient documentation

## 2014-09-19 DIAGNOSIS — Z86718 Personal history of other venous thrombosis and embolism: Secondary | ICD-10-CM | POA: Insufficient documentation

## 2014-09-19 DIAGNOSIS — M1990 Unspecified osteoarthritis, unspecified site: Secondary | ICD-10-CM | POA: Insufficient documentation

## 2014-09-19 DIAGNOSIS — E785 Hyperlipidemia, unspecified: Secondary | ICD-10-CM | POA: Insufficient documentation

## 2014-09-19 DIAGNOSIS — Z7982 Long term (current) use of aspirin: Secondary | ICD-10-CM | POA: Insufficient documentation

## 2014-09-19 DIAGNOSIS — E119 Type 2 diabetes mellitus without complications: Secondary | ICD-10-CM | POA: Diagnosis not present

## 2014-09-19 DIAGNOSIS — Z87891 Personal history of nicotine dependence: Secondary | ICD-10-CM | POA: Insufficient documentation

## 2014-09-19 DIAGNOSIS — I5022 Chronic systolic (congestive) heart failure: Secondary | ICD-10-CM | POA: Insufficient documentation

## 2014-09-19 DIAGNOSIS — I10 Essential (primary) hypertension: Secondary | ICD-10-CM | POA: Insufficient documentation

## 2014-09-19 DIAGNOSIS — R42 Dizziness and giddiness: Secondary | ICD-10-CM | POA: Diagnosis present

## 2014-09-19 LAB — BASIC METABOLIC PANEL
Anion gap: 8 (ref 5–15)
BUN: 25 mg/dL — ABNORMAL HIGH (ref 6–23)
CO2: 25 mmol/L (ref 19–32)
Calcium: 8.7 mg/dL (ref 8.4–10.5)
Chloride: 107 mmol/L (ref 96–112)
Creatinine, Ser: 1.79 mg/dL — ABNORMAL HIGH (ref 0.50–1.35)
GFR calc non Af Amer: 36 mL/min — ABNORMAL LOW (ref >90)
GFR, EST AFRICAN AMERICAN: 42 mL/min — AB (ref >90)
GLUCOSE: 177 mg/dL — AB (ref 70–99)
POTASSIUM: 3.6 mmol/L (ref 3.5–5.1)
Sodium: 140 mmol/L (ref 135–145)

## 2014-09-19 LAB — TROPONIN I

## 2014-09-19 LAB — CBC WITH DIFFERENTIAL/PLATELET
BASOS ABS: 0 10*3/uL (ref 0.0–0.1)
BASOS PCT: 1 % (ref 0–1)
EOS ABS: 0.1 10*3/uL (ref 0.0–0.7)
Eosinophils Relative: 2 % (ref 0–5)
HEMATOCRIT: 33.3 % — AB (ref 39.0–52.0)
Hemoglobin: 10.5 g/dL — ABNORMAL LOW (ref 13.0–17.0)
Lymphocytes Relative: 17 % (ref 12–46)
Lymphs Abs: 1.1 10*3/uL (ref 0.7–4.0)
MCH: 26.1 pg (ref 26.0–34.0)
MCHC: 31.5 g/dL (ref 30.0–36.0)
MCV: 82.8 fL (ref 78.0–100.0)
MONOS PCT: 10 % (ref 3–12)
Monocytes Absolute: 0.6 10*3/uL (ref 0.1–1.0)
NEUTROS PCT: 70 % (ref 43–77)
Neutro Abs: 4.4 10*3/uL (ref 1.7–7.7)
Platelets: 196 10*3/uL (ref 150–400)
RBC: 4.02 MIL/uL — ABNORMAL LOW (ref 4.22–5.81)
RDW: 17.4 % — AB (ref 11.5–15.5)
WBC: 6.3 10*3/uL (ref 4.0–10.5)

## 2014-09-19 LAB — BRAIN NATRIURETIC PEPTIDE: B Natriuretic Peptide: 131.4 pg/mL — ABNORMAL HIGH (ref 0.0–100.0)

## 2014-09-19 NOTE — ED Notes (Signed)
He is feeling congestion ijn his mid-chest.  No pain now. No visible diff breathing

## 2014-09-19 NOTE — ED Notes (Signed)
The pt has some sob dizziness henhad some numbness inhis lt arm around 2100 no numbness now.  He just left the hospital one week ago after being admitted with pneumonuia.

## 2014-09-19 NOTE — ED Provider Notes (Signed)
CSN: [id number]     Arrival date & time 09/19/14  2142 History  This chart was scribed for Gary Schmidt, MD by Eustaquio Maize, ED Scribe. This patient was seen in room A04C/A04C and the patient's care was started at 11:07 PM.    Chief Complaint  Patient presents with  . Dizziness   The history is provided by the patient. No language interpreter was used.     HPI Comments: [name] is a 74 y.o. male who presents to the Emergency Department complaining of dizziness that began earlier today. Pt also complains of sudden reflux and a "cold" sensation running to his left arm that began after his dizziness. He states that the dizziness lasted approximately 20 minutes. Pt mentions that on 07/23/14 (approximately 2 months ago) his defibrillator went off and states that he feels as though these symptoms are similar to last time. Pt's defibrillator did not go off this time. Pt does mention a chest congestion that has been ongoing for awhile from pneumonia. Pt is currently symptom free. He denies LOC, fever, chills, chest pain, or any other symptoms. Pt is not currently on anti-coagulants.   Cardiologist - Dr. Rayann Heman   Past Medical History  Diagnosis Date  . Obesity   . Prostate cancer   . Glucose intolerance (impaired glucose tolerance)   . Bursitis of elbow     left  . Osteoarthritis   . HTN (hypertension)   . HLD (hyperlipidemia)   . History of DVT (deep vein thrombosis)   . Chronic systolic heart failure     echo 4/13: EF 25-30%, mod MR, mod LAE, PASP 47  . NICM (nonischemic cardiomyopathy)     normal cors by cath in 1990s;  nuclear 8/08: no ischemia; inf/lat/dist ant/apical scar  . Moderate mitral regurgitation     echo 4/13  . Ventricular tachycardia 07/2014  . Diabetes mellitus without complication    Past Surgical History  Procedure Laterality Date  . Transurethral resection of prostate    . Cardiac defibrillator placement  10/09    STJ single chamber ICD   . Hernia repair    .  Robot assisted laparoscopic radical prostatectomy  6/08  . Left and right heart catheterization with coronary angiogram N/A 08/13/2014    Procedure: LEFT AND RIGHT HEART CATHETERIZATION WITH CORONARY ANGIOGRAM;  Surgeon: Sinclair Grooms, MD;  Location: [facility] CATH [facility];  Service: Cardiovascular;  Laterality: N/A;   Family History  Problem Relation Age of Onset  . Cancer Father     prostate  . Heart disease Mother    History  Substance Use Topics  . Smoking status: Former Smoker -- 0.50 packs/day for 10 years    Types: Cigarettes    Quit date: 11/01/1975  . Smokeless tobacco: Never Used     Comment: quit over 30 years ago   . Alcohol Use: No     Comment: occasionally    Review of Systems  A complete 10 system review of systems was obtained and all systems are negative except as noted in the HPI and PMH.    Allergies  Spironolactone  Home Medications   Prior to Admission medications   Medication Sig Start Date End Date Taking? Authorizing Provider  acetaminophen (TYLENOL) 325 MG tablet Take 2 tablets (650 mg total) by mouth every 4 (four) hours as needed for headache or mild pain. 08/16/14  Yes Erlene Quan, PA-C  amiodarone (PACERONE) 200 MG tablet Take 1 tablet (200 mg total) by  mouth 2 (two) times daily. 08/24/14 09/20/14 Yes Lelon Perla, MD  aspirin 81 MG tablet Take 81 mg by mouth daily.     Yes Historical Provider, MD  carvedilol (COREG) 25 MG tablet Take 25 mg by mouth 2 (two) times daily with a meal.     Yes Historical Provider, MD  Cholecalciferol (VITAMIN D-3) 1000 UNITS CAPS Take 1,000 Units by mouth daily.   Yes Historical Provider, MD  COLCRYS 0.6 MG tablet Take 0.6 mg by mouth 2 (two) times daily as needed (for gout symptoms).  03/11/14  Yes Historical Provider, MD  digoxin (LANOXIN) 0.125 MG tablet Take 125 mcg by mouth daily.     Yes Historical Provider, MD  folic acid (FOLVITE) 1 MG tablet Take 1 mg by mouth daily.   Yes Historical Provider, MD  furosemide (LASIX)  40 MG tablet Take 40 mg by mouth daily. Take daily as directed 09/14/11  Yes Thompson Grayer, MD  HYDROcodone-acetaminophen (NORCO) 10-325 MG per tablet Take 1 tablet by mouth every 8 (eight) hours as needed. Patient taking differently: Take 1 tablet by mouth every 8 (eight) hours as needed for moderate pain.  08/21/14  Yes Janith Lima, MD  Linaclotide [facility]) 145 MCG CAPS capsule Take 1 capsule (145 mcg total) by mouth daily. Patient taking differently: Take 145 mcg by mouth daily as needed (constipation).  07/28/14  Yes Janith Lima, MD  lisinopril (PRINIVIL,ZESTRIL) 5 MG tablet Take 5 mg by mouth daily.    Yes Historical Provider, MD  Methotrexate Sodium, PF, 50 MG/2ML SOLN Inject 0.8 mLs as directed once a week. Take on sundays   Yes Historical Provider, MD  simvastatin (ZOCOR) 20 MG tablet Take 10 mg by mouth daily.    Yes Historical Provider, MD  TRADJENTA 5 MG TABS tablet TAKE 1 TABLET BY MOUTH DAILY FOR DIABETES 08/24/14  Yes Janith Lima, MD  ULORIC 80 MG TABS TAKE 1 TABLET (80 MG TOTAL) BY MOUTH DAILY. Patient taking differently: TAKE 2 TABLETS IN THE MORNING AND 1 TABLET IN THE EVENING. 04/14/14  Yes Janith Lima, MD   Triage Vitals: BP 126/64 mmHg  Pulse 70  Resp 20  SpO2 100%   Physical Exam  Constitutional: He is oriented to person, place, and time. He appears well-developed and well-nourished.  HENT:  Head: Normocephalic and atraumatic.  Eyes: EOM are normal.  Neck: Normal range of motion.  Cardiovascular: Normal rate, regular rhythm, normal heart sounds and intact distal pulses.   Pulmonary/Chest: Effort normal and breath sounds normal. No respiratory distress.  Abdominal: Soft. He exhibits no distension. There is no tenderness.  Musculoskeletal: Normal range of motion.  Neurological: He is alert and oriented to person, place, and time.  Skin: Skin is warm and dry.  Psychiatric: He has a normal mood and affect. Judgment normal.  Nursing note and vitals reviewed.   ED  Course  Procedures (including critical care time)  DIAGNOSTIC STUDIES: Oxygen Saturation is 100% on RA, normal by my interpretation.    COORDINATION OF CARE: 11:11 PM-Discussed treatment plan which includes CXR, CBC, Troponin, BMP, BNP with pt at bedside and pt agreed to plan.   Labs Review Labs Reviewed  BASIC METABOLIC PANEL - Abnormal; Notable for the following:    Glucose, Bld 177 (*)    BUN 25 (*)    Creatinine, Ser 1.79 (*)    GFR calc non Af Amer 36 (*)    GFR calc Af Amer 42 (*)  All other components within normal limits  BRAIN NATRIURETIC PEPTIDE - Abnormal; Notable for the following:    B Natriuretic Peptide 131.4 (*)    All other components within normal limits  CBC WITH DIFFERENTIAL/PLATELET - Abnormal; Notable for the following:    RBC 4.02 (*)    Hemoglobin 10.5 (*)    HCT 33.3 (*)    RDW 17.4 (*)    All other components within normal limits  TROPONIN I    Imaging Review Dg Chest 2 View  09/20/2014   CLINICAL DATA:  Acute onset of dizziness and funny sensation in chest. Initial encounter.  EXAM: CHEST  2 VIEW  COMPARISON:  Chest radiograph performed 09/14/2014  FINDINGS: The lungs are well-aerated and clear. There is no evidence of focal opacification, pleural effusion or pneumothorax.  The heart is borderline normal in size. An AICD is noted at the left chest wall, with a single lead ending at the right ventricle. No acute osseous abnormalities are seen.  IMPRESSION: No acute cardiopulmonary process seen.   Electronically Signed   By: Garald Balding M.D.   On: 09/20/2014 00:04     EKG Interpretation   Date/Time:  Saturday September 19 2014 21:48:44 EDT Ventricular Rate:  76 PR Interval:  212 QRS Duration: 120 QT Interval:  382 QTC Calculation: 429 R Axis:   65 Text Interpretation:  Sinus rhythm with 1st degree A-V block Low voltage  QRS Non-specific intra-ventricular conduction delay Borderline ECG No  significant change was found Confirmed by Arlyn Buerkle  MD,  Lennette Bihari ([id number]) on  09/19/2014 11:12:00 PM      MDM   Final diagnoses:  None    The patient's symptoms had completed resolved by the time I was seeing him.  The chest discomfort was very transient.  Doubt stroke.  Patient's [employer] Jude defibrillator was interrogated.  No acute abnormalities were noted.  No arrhythmias were noted.  No shock was given.  Defibrillator appears to be working well   I personally performed the services described in this documentation, which was scribed in my presence. The recorded information has been reviewed and is accurate.       Gary Schmidt, MD 09/20/14 [phone]

## 2014-09-20 NOTE — ED Notes (Signed)
St. Jude Defibrillator Report: single-changer defib w/ 1 wire; pacemaker part paces <1% of the time. No tachy arrhythmias noted. Battery fine. Overall unremarkable. Dr. Venora Maples notified.

## 2014-09-21 ENCOUNTER — Encounter: Payer: Self-pay | Admitting: Internal Medicine

## 2014-09-21 ENCOUNTER — Other Ambulatory Visit: Payer: Self-pay

## 2014-09-21 ENCOUNTER — Ambulatory Visit (INDEPENDENT_AMBULATORY_CARE_PROVIDER_SITE_OTHER): Payer: Medicare Other | Admitting: Internal Medicine

## 2014-09-21 VITALS — BP 118/64 | HR 74 | Ht 69.0 in | Wt 196.4 lb

## 2014-09-21 DIAGNOSIS — I429 Cardiomyopathy, unspecified: Secondary | ICD-10-CM

## 2014-09-21 DIAGNOSIS — I808 Phlebitis and thrombophlebitis of other sites: Secondary | ICD-10-CM

## 2014-09-21 DIAGNOSIS — I420 Dilated cardiomyopathy: Secondary | ICD-10-CM | POA: Diagnosis not present

## 2014-09-21 DIAGNOSIS — I472 Ventricular tachycardia, unspecified: Secondary | ICD-10-CM

## 2014-09-21 DIAGNOSIS — R002 Palpitations: Secondary | ICD-10-CM

## 2014-09-21 DIAGNOSIS — I5022 Chronic systolic (congestive) heart failure: Secondary | ICD-10-CM | POA: Diagnosis not present

## 2014-09-21 DIAGNOSIS — I4729 Other ventricular tachycardia: Secondary | ICD-10-CM

## 2014-09-21 DIAGNOSIS — I428 Other cardiomyopathies: Secondary | ICD-10-CM

## 2014-09-21 DIAGNOSIS — I82A12 Acute embolism and thrombosis of left axillary vein: Secondary | ICD-10-CM

## 2014-09-21 DIAGNOSIS — Z79899 Other long term (current) drug therapy: Secondary | ICD-10-CM

## 2014-09-21 LAB — HEPATIC FUNCTION PANEL
ALT: 29 U/L (ref 0–53)
AST: 23 U/L (ref 0–37)
Albumin: 3.5 g/dL (ref 3.5–5.2)
Alkaline Phosphatase: 55 U/L (ref 39–117)
Bilirubin, Direct: 0.1 mg/dL (ref 0.0–0.3)
TOTAL PROTEIN: 6.4 g/dL (ref 6.0–8.3)
Total Bilirubin: 0.4 mg/dL (ref 0.2–1.2)

## 2014-09-21 LAB — MDC_IDC_ENUM_SESS_TYPE_INCLINIC
Battery Voltage: 2.57 V
Brady Statistic RV Percent Paced: 0 %
Date Time Interrogation Session: 20160404140525
HighPow Impedance: 44.6975
Lead Channel Impedance Value: 325 Ohm
Lead Channel Pacing Threshold Amplitude: 1.5 V
Lead Channel Sensing Intrinsic Amplitude: 9 mV
Lead Channel Setting Sensing Sensitivity: 0.3 mV
MDC IDC MSMT BATTERY REMAINING LONGEVITY: 51.6 mo
MDC IDC MSMT LEADCHNL RV PACING THRESHOLD AMPLITUDE: 1.5 V
MDC IDC MSMT LEADCHNL RV PACING THRESHOLD PULSEWIDTH: 0.5 ms
MDC IDC MSMT LEADCHNL RV PACING THRESHOLD PULSEWIDTH: 0.5 ms
MDC IDC PG SERIAL: 665247
MDC IDC SET LEADCHNL RV PACING AMPLITUDE: 3 V
MDC IDC SET LEADCHNL RV PACING PULSEWIDTH: 0.5 ms
MDC IDC SET ZONE DETECTION INTERVAL: 300 ms

## 2014-09-21 LAB — T4, FREE: Free T4: 1.02 ng/dL (ref 0.60–1.60)

## 2014-09-21 LAB — TSH: TSH: 1.4 u[IU]/mL (ref 0.35–4.50)

## 2014-09-21 MED ORDER — AMIODARONE HCL 200 MG PO TABS: 200.0000 mg | ORAL_TABLET | Freq: Every day | ORAL | Status: DC

## 2014-09-21 NOTE — Progress Notes (Signed)
Electrophysiology Office Note   Date:  09/21/2014   ID:  [name], [name] 04/25/1941, MRN [id number]  PCP:  Scarlette Calico, MD  Cardiologist:  Dr Stanford Breed Primary Electrophysiologist: Thompson Grayer, MD    Chief Complaint  Patient presents with  . Follow-up    VT     History of Present Illness: [name] is a 74 y.o. male who presents today for electrophysiology evaluation.   He is doing reasonably well since he left the hospital.  He did have some hemoptysis on xarelto and this was stopped.  He is afraid of ICD shocks.  He has not been active.  He was in the ER this past weekend with chest tightness and a "cold" sensation in his L arm.  He has had no further ICD shocks.  Today, he denies symptoms of exertional chest pain, shortness of breath, orthopnea, PND, lower extremity edema, claudication, dizziness, presyncope, syncope, bleeding, or neurologic sequela. The patient is tolerating medications without difficulties and is otherwise without complaint today.    Past Medical History  Diagnosis Date  . Obesity   . Prostate cancer   . Glucose intolerance (impaired glucose tolerance)   . Bursitis of elbow     left  . Osteoarthritis   . HTN (hypertension)   . HLD (hyperlipidemia)   . History of DVT (deep vein thrombosis)   . Chronic systolic heart failure     echo 4/13: EF 25-30%, mod MR, mod LAE, PASP 47  . NICM (nonischemic cardiomyopathy)     normal cors by cath in 1990s;  nuclear 8/08: no ischemia; inf/lat/dist ant/apical scar  . Moderate mitral regurgitation     echo 4/13  . Ventricular tachycardia 07/2014  . Diabetes mellitus without complication    Past Surgical History  Procedure Laterality Date  . Transurethral resection of prostate    . Cardiac defibrillator placement  10/09    STJ single chamber ICD   . Hernia repair    . Robot assisted laparoscopic radical prostatectomy  6/08  . Left and right heart catheterization with coronary angiogram N/A 08/13/2014   Procedure: LEFT AND RIGHT HEART CATHETERIZATION WITH CORONARY ANGIOGRAM;  Surgeon: Sinclair Grooms, MD;  Location: [facility] CATH LAB;  Service: Cardiovascular;  Laterality: N/A;     Current Outpatient Prescriptions  Medication Sig Dispense Refill  . acetaminophen (TYLENOL) 325 MG tablet Take 2 tablets (650 mg total) by mouth every 4 (four) hours as needed for headache or mild pain.    Marland Kitchen amiodarone (PACERONE) 200 MG tablet Take 1 tablet (200 mg total) by mouth 2 (two) times daily. 60 tablet 6  . aspirin 81 MG tablet Take 81 mg by mouth daily.      . carvedilol (COREG) 25 MG tablet Take 25 mg by mouth 2 (two) times daily with a meal.      . Cholecalciferol (VITAMIN D-3) 1000 UNITS CAPS Take 1,000 Units by mouth daily.    Marland Kitchen COLCRYS 0.6 MG tablet Take 0.6 mg by mouth 2 (two) times daily as needed (for gout symptoms).     Marland Kitchen digoxin (LANOXIN) 0.125 MG tablet Take 125 mcg by mouth daily.      . Febuxostat 80 MG TABS Take 2 tablets by mouth in the morning and take 1 tablet by mouth in the evening    . folic acid (FOLVITE) 1 MG tablet Take 1 mg by mouth daily.    . furosemide (LASIX) 40 MG tablet Take 40 mg by mouth  daily.     Marland Kitchen HYDROcodone-acetaminophen (NORCO) 10-325 MG per tablet Take 1 tablet by mouth every 8 (eight) hours as needed. (Patient taking differently: Take 1 tablet by mouth every 8 (eight) hours as needed for moderate pain. ) 65 tablet 0  . Linaclotide (LINZESS) 145 MCG CAPS capsule Take 145 mcg by mouth daily as needed (constipation).    Marland Kitchen lisinopril (PRINIVIL,ZESTRIL) 5 MG tablet Take 5 mg by mouth daily.     . Methotrexate Sodium, PF, 50 MG/2ML SOLN Inject 0.8 mLs as directed once a week. Take on sundays    . simvastatin (ZOCOR) 20 MG tablet Take 10 mg by mouth daily.     . TRADJENTA 5 MG TABS tablet TAKE 1 TABLET BY MOUTH DAILY FOR DIABETES 90 tablet 2   No current facility-administered medications for this visit.    Allergies:   Spironolactone   Social History:  The patient   reports that he quit smoking about 38 years ago. His smoking use included Cigarettes. He has a 5 pack-year smoking history. He has never used smokeless tobacco. He reports that he does not drink alcohol or use illicit drugs.   Family History:  The patient's family history includes Cancer in his father; Heart disease in his mother.    ROS:  Please see the history of present illness.   All other systems are reviewed and negative.    PHYSICAL EXAM: VS:  BP 118/64 mmHg  Pulse 74  Ht 5\' 9"  (1.753 m)  Wt 196 lb 6.4 oz (89.086 kg)  BMI 28.99 kg/m2 , BMI Body mass index is 28.99 kg/(m^2). GEN: Well nourished, well developed, in no acute distress HEENT: normal Neck: no JVD, carotid bruits, or masses Cardiac: RRR; no murmurs, rubs, or gallops,no edema  Respiratory:  clear to auscultation bilaterally, normal work of breathing GI: soft, nontender, nondistended, + BS MS: no deformity or atrophy Skin: warm and dry, device pocket is well healed Neuro:  Strength and sensation are intact Psych: euthymic mood, full affect  EKG:  EKG is ordered today. The ekg ordered today shows sinus rhythm 74 bpm, PR 208, QRS 1320, IVCD, Qtc 435,   Device interrogation is reviewed today in detail.  See PaceArt for details.   Recent Labs: 08/13/2014: Magnesium 1.8; TSH 0.827 09/03/2014: ALT 32 09/19/2014: B Natriuretic Peptide 131.4*; BUN 25*; Creatinine 1.79*; Hemoglobin 10.5*; Platelets 196; Potassium 3.6; Sodium 140    Lipid Panel     Component Value Date/Time   CHOL 117 08/13/2014 0253   TRIG 48 08/13/2014 0253   HDL 40 08/13/2014 0253   CHOLHDL 2.9 08/13/2014 0253   VLDL 10 08/13/2014 0253   LDLCALC 67 08/13/2014 0253     Wt Readings from Last 3 Encounters:  09/21/14 196 lb 6.4 oz (89.086 kg)  09/14/14 193 lb (87.544 kg)  09/07/14 193 lb 4.8 oz (87.68 kg)      Other studies Reviewed: Additional studies/ records that were reviewed today include: ER records and prior hospital records      ASSESSMENT AND PLAN:  1.  VT Improved No driving x 6 months (pt aware) Reduce amiodarone to 200mg  daily today Check LFTs/TFTs today Will need PFTs arranged upon return (after prior pneumonia is resolved) Normal ICD function See Pace Art report No changes today Risks of amiodarone were discussed with patient and spouse and they would like to continue this medicine  2. Chronic systolic dysfunction Stable No change required today euvolemic on exam  3. HTN Stable No change  required today  4. ?DVT This was more likely venous screlosing effect of IV amiodarone than a spontaneous event I agree that anticoagulation going forward offers more risk than benefit xarelto was stopped when he had hemoptysis (records reviewed)   Current medicines are reviewed at length with the patient today.   The patient does not have concerns regarding his medicines.  The following changes were made today:  none   Follow-up: merlin, return to see EP NP in 6 weeks, I will see in 3 months, would probably benefit from follow-up with Dr Stanford Breed in the next few weeks/ months also  Signed, Thompson Grayer, MD  09/21/2014 11:26 AM     [facility] [address] [place] [address] [phone] (office) [phone] (fax)

## 2014-09-21 NOTE — Patient Instructions (Addendum)
Your physician recommends that you schedule a follow-up appointment in: 6 weeks with Chanetta Marshall, NP and 3 months with Dr Rayann Heman  Your physician recommends that you return for lab work today: TSH/T4/Liver  Your physician has recommended you make the following change in your medication:  10 Decrease Amiodarone to 200 mg daily

## 2014-10-12 ENCOUNTER — Encounter: Payer: Self-pay | Admitting: Internal Medicine

## 2014-10-12 ENCOUNTER — Ambulatory Visit (INDEPENDENT_AMBULATORY_CARE_PROVIDER_SITE_OTHER): Payer: Medicare Other | Admitting: Internal Medicine

## 2014-10-12 ENCOUNTER — Other Ambulatory Visit: Payer: Self-pay

## 2014-10-12 VITALS — BP 118/60 | HR 75 | Temp 97.8°F | Resp 18 | Wt 201.0 lb

## 2014-10-12 DIAGNOSIS — R1310 Dysphagia, unspecified: Secondary | ICD-10-CM | POA: Diagnosis not present

## 2014-10-12 DIAGNOSIS — I10 Essential (primary) hypertension: Secondary | ICD-10-CM

## 2014-10-12 DIAGNOSIS — K2100 Gastro-esophageal reflux disease with esophagitis, without bleeding: Secondary | ICD-10-CM | POA: Insufficient documentation

## 2014-10-12 MED ORDER — OMEPRAZOLE 40 MG PO CPDR: 40.0000 mg | DELAYED_RELEASE_CAPSULE | Freq: Every day | ORAL | Status: AC

## 2014-10-12 NOTE — Patient Instructions (Signed)
Patient reports he does not have an interest in [facility] at present. Patient is agreeable to information on [facility] and programs offered by [facility].

## 2014-10-12 NOTE — Progress Notes (Signed)
Subjective:    Patient ID: [name], male    DOB: 1941-05-11, 74 y.o.   MRN: [id number]  HPI Comments: He complains of belching and burping and feels pain when food is passing thru his lower esophagus over the xyphoid area.  Gastrophageal Reflux He complains of belching and dysphagia. He reports no abdominal pain, no chest pain, no choking, no coughing, no early satiety, no globus sensation, no heartburn, no hoarse voice, no nausea, no sore throat, no stridor, no tooth decay, no water brash or no wheezing. This is a recurrent problem. The current episode started 1 to 4 weeks ago. The problem occurs occasionally. The problem has been gradually worsening. Nothing aggravates the symptoms. Associated symptoms include anemia and fatigue. Pertinent negatives include no melena, muscle weakness, orthopnea or weight loss. He has tried nothing for the symptoms.      Review of Systems  Constitutional: Positive for fatigue. Negative for fever, chills, weight loss, diaphoresis and appetite change.  HENT: Positive for trouble swallowing. Negative for hoarse voice, sore throat and voice change.   Eyes: Negative.   Respiratory: Negative.  Negative for cough, choking, shortness of breath, wheezing and stridor.   Cardiovascular: Negative.  Negative for chest pain, palpitations and leg swelling.  Gastrointestinal: Positive for dysphagia. Negative for heartburn, nausea, vomiting, abdominal pain, diarrhea, constipation, blood in stool and melena.  Endocrine: Negative.   Genitourinary: Negative.   Musculoskeletal: Negative.  Negative for muscle weakness.  Skin: Negative.   Allergic/Immunologic: Negative.   Neurological: Negative.   Hematological: Negative.  Negative for adenopathy. Does not bruise/bleed easily.  Psychiatric/Behavioral: Negative.        Objective:   Physical Exam  Constitutional: He is oriented to person, place, and time. He appears well-developed and well-nourished. No distress.    HENT:  Head: Normocephalic and atraumatic.  Mouth/Throat: Oropharynx is clear and moist. No oropharyngeal exudate.  Eyes: Conjunctivae are normal. Right eye exhibits no discharge. Left eye exhibits no discharge. No scleral icterus.  Neck: Normal range of motion. Neck supple. No JVD present. No tracheal deviation present. No thyromegaly present.  Cardiovascular: Normal rate, regular rhythm, S1 normal, S2 normal and intact distal pulses.  Exam reveals gallop and S4. Exam reveals no S3.   Murmur heard.  Systolic murmur is present with a grade of 2/6   No diastolic murmur is present  Pulmonary/Chest: Effort normal and breath sounds normal. No stridor. No respiratory distress. He has no wheezes. He has no rales. He exhibits no tenderness.  Abdominal: Soft. Bowel sounds are normal. He exhibits no distension and no mass. There is no tenderness. There is no rebound and no guarding.  Musculoskeletal: Normal range of motion. He exhibits no edema or tenderness.  Lymphadenopathy:    He has no cervical adenopathy.  Neurological: He is oriented to person, place, and time.  Skin: Skin is warm and dry. No rash noted. He is not diaphoretic. No erythema. No pallor.  Psychiatric: He has a normal mood and affect. His behavior is normal. Judgment and thought content normal.  Vitals reviewed.   Lab Results  Component Value Date   WBC 6.3 09/19/2014   HGB 10.5* 09/19/2014   HCT 33.3* 09/19/2014   PLT 196 09/19/2014   GLUCOSE 177* 09/19/2014   CHOL 117 08/13/2014   TRIG 48 08/13/2014   HDL 40 08/13/2014   LDLCALC 67 08/13/2014   ALT 29 09/21/2014   AST 23 09/21/2014   NA 140 09/19/2014   K 3.6 09/19/2014  CL 107 09/19/2014   CREATININE 1.79* 09/19/2014   BUN 25* 09/19/2014   CO2 25 09/19/2014   TSH 1.40 09/21/2014   PSA <0.01 ng/mL* 11/09/2008   INR 3.08* 09/02/2014   HGBA1C 6.6* 07/28/2014   MICROALBUR 1.0 07/28/2014        Assessment & Plan:

## 2014-10-12 NOTE — Assessment & Plan Note (Signed)
Will start a PPI to see if that helps I am concerned about UGI pathology - mass (cancer), ulcer, stricture, ring, etc. - so I have asked hi to see GI to consider having upper endoscopy done

## 2014-10-12 NOTE — Assessment & Plan Note (Signed)
His BP is well controlled 

## 2014-10-12 NOTE — Assessment & Plan Note (Signed)
Will start a PPI for symptom relief He has some alarming symptoms so will refer to GI to consider upper endoscopy

## 2014-10-12 NOTE — Progress Notes (Signed)
Pre visit review using our clinic review tool, if applicable. No additional management support is needed unless otherwise documented below in the visit note. 

## 2014-10-12 NOTE — Patient Instructions (Signed)
Gastroesophageal Reflux Disease, Adult Gastroesophageal reflux disease (GERD) happens when acid from your stomach flows up into the esophagus. When acid comes in contact with the esophagus, the acid causes soreness (inflammation) in the esophagus. Over time, GERD may create small holes (ulcers) in the lining of the esophagus. CAUSES   Increased body weight. This puts pressure on the stomach, making acid rise from the stomach into the esophagus.  Smoking. This increases acid production in the stomach.  Drinking alcohol. This causes decreased pressure in the lower esophageal sphincter (valve or ring of muscle between the esophagus and stomach), allowing acid from the stomach into the esophagus.  Late evening meals and a full stomach. This increases pressure and acid production in the stomach.  A malformed lower esophageal sphincter. Sometimes, no cause is found. SYMPTOMS   Burning pain in the lower part of the mid-chest behind the breastbone and in the mid-stomach area. This may occur twice a week or more often.  Trouble swallowing.  Sore throat.  Dry cough.  Asthma-like symptoms including chest tightness, shortness of breath, or wheezing. DIAGNOSIS  Your caregiver may be able to diagnose GERD based on your symptoms. In some cases, X-rays and other tests may be done to check for complications or to check the condition of your stomach and esophagus. TREATMENT  Your caregiver may recommend over-the-counter or prescription medicines to help decrease acid production. Ask your caregiver before starting or adding any new medicines.  HOME CARE INSTRUCTIONS   Change the factors that you can control. Ask your caregiver for guidance concerning weight loss, quitting smoking, and alcohol consumption.  Avoid foods and drinks that make your symptoms worse, such as:  Caffeine or alcoholic drinks.  Chocolate.  Peppermint or mint flavorings.  Garlic and onions.  Spicy foods.  Citrus fruits,  such as oranges, lemons, or limes.  Tomato-based foods such as sauce, chili, salsa, and pizza.  Fried and fatty foods.  Avoid lying down for the 3 hours prior to your bedtime or prior to taking a nap.  Eat small, frequent meals instead of large meals.  Wear loose-fitting clothing. Do not wear anything tight around your waist that causes pressure on your stomach.  Raise the head of your bed 6 to 8 inches with wood blocks to help you sleep. Extra pillows will not help.  Only take over-the-counter or prescription medicines for pain, discomfort, or fever as directed by your caregiver.  Do not take aspirin, ibuprofen, or other nonsteroidal anti-inflammatory drugs (NSAIDs). SEEK IMMEDIATE MEDICAL CARE IF:   You have pain in your arms, neck, jaw, teeth, or back.  Your pain increases or changes in intensity or duration.  You develop nausea, vomiting, or sweating (diaphoresis).  You develop shortness of breath, or you faint.  Your vomit is green, yellow, black, or looks like coffee grounds or blood.  Your stool is red, bloody, or black. These symptoms could be signs of other problems, such as heart disease, gastric bleeding, or esophageal bleeding. MAKE SURE YOU:   Understand these instructions.  Will watch your condition.  Will get help right away if you are not doing well or get worse. Document Released: 03/15/2005 Document Revised: 08/28/2011 Document Reviewed: 12/23/2010 ExitCare Patient Information 2015 [employer]. This information is not intended to replace advice given to you by your health care provider. Make sure you discuss any questions you have with your health care provider.  

## 2014-10-13 ENCOUNTER — Telehealth: Payer: Self-pay | Admitting: Internal Medicine

## 2014-10-13 NOTE — Telephone Encounter (Signed)
Patient received a phone call today and I can't see if it was you. He said he was in yesterday and gave a urine sample, but I didn't see that either. Please advise

## 2014-10-14 ENCOUNTER — Encounter: Payer: Self-pay | Admitting: Gastroenterology

## 2014-10-14 NOTE — Telephone Encounter (Signed)
Notified pt spoke with wife was not Dr. Ronnald Ramp office, actually it was GI office concerning the referral..../lmb

## 2014-10-14 NOTE — Patient Outreach (Signed)
[facility]) Care Management  10/14/2014  [name] [birth year] [id number]   Received notification from [name], RN to close case due to patient is not interested in [facility].  [name]. [place] CM Assistant Phone: [phone] Fax: [phone]

## 2014-11-01 ENCOUNTER — Encounter: Payer: Self-pay | Admitting: Nurse Practitioner

## 2014-11-01 NOTE — Progress Notes (Signed)
Electrophysiology Office Note Date: 11/02/2014  ID:  [place], Vert 03/14/1941, MRN [id number]  PCP: Scarlette Calico, MD Primary Cardiologist: Stanford Breed Electrophysiologist: Allred  CC: Follow-up for ventricular tachycardia  [name] is a 74 y.o. male is seen today for Dr Rayann Heman.  He presents today for routine electrophysiology followup. He was admitted 07/2014 with appropriate ICD therapy for VT and was placed on amiodarone at that time.  Catheterization demonstrated no CAD.    Since last being seen in our clinic, the patient reports doing very well. He denies chest pain, palpitations, dyspnea, PND, orthopnea, nausea, vomiting, dizziness, syncope, edema, weight gain, or early satiety.  He has not had recurrent ICD shocks.   Device History: STJ single chamber ICD implanted 2009 for NICM/primary prevention History of appropriate therapy: Yes History of AAD therapy: Yes - amiodarone   Past Medical History  Diagnosis Date  . Obesity   . Prostate cancer   . Bursitis of elbow     left  . Osteoarthritis   . HTN (hypertension)   . HLD (hyperlipidemia)   . History of DVT (deep vein thrombosis)   . Chronic systolic heart failure     echo 4/13: EF 25-30%, mod MR, mod LAE, PASP 47  . NICM (nonischemic cardiomyopathy)     normal cors by cath in 1990s;  nuclear 8/08: no ischemia; inf/lat/dist ant/apical scar  . Moderate mitral regurgitation     echo 4/13  . Ventricular tachycardia 07/2014  . Diabetes mellitus without complication    Past Surgical History  Procedure Laterality Date  . Transurethral resection of prostate    . Cardiac defibrillator placement  10/09    STJ single chamber ICD   . Hernia repair    . Robot assisted laparoscopic radical prostatectomy  6/08  . Left and right heart catheterization with coronary angiogram N/A 08/13/2014    Procedure: LEFT AND RIGHT HEART CATHETERIZATION WITH CORONARY ANGIOGRAM;  Surgeon: Sinclair Grooms, MD;  Location: [facility] CATH LAB;   Service: Cardiovascular;  Laterality: N/A;    Current Outpatient Prescriptions  Medication Sig Dispense Refill  . acetaminophen (TYLENOL) 325 MG tablet Take 2 tablets (650 mg total) by mouth every 4 (four) hours as needed for headache or mild pain.    Marland Kitchen aspirin 81 MG tablet Take 81 mg by mouth daily.      . carvedilol (COREG) 25 MG tablet Take 25 mg by mouth 2 (two) times daily with a meal.      . Cholecalciferol (VITAMIN D-3) 1000 UNITS CAPS Take 1,000 Units by mouth 2 (two) times daily.     Marland Kitchen COLCRYS 0.6 MG tablet Take 0.6 mg by mouth 2 (two) times daily as needed (for gout symptoms).     Marland Kitchen digoxin (LANOXIN) 0.125 MG tablet Take 125 mcg by mouth daily.      . Febuxostat 80 MG TABS Take 2 tablets by mouth in the morning and take 1 tablet by mouth in the evening    . folic acid (FOLVITE) 1 MG tablet Take 1 mg by mouth daily.    . furosemide (LASIX) 40 MG tablet Take 40 mg by mouth daily.     Marland Kitchen HYDROcodone-acetaminophen (NORCO) 10-325 MG per tablet Take 1 tablet by mouth every 8 (eight) hours as needed for moderate pain or severe pain.    . Linaclotide (LINZESS) 145 MCG CAPS capsule Take 145 mcg by mouth daily as needed (constipation).    Marland Kitchen lisinopril (PRINIVIL,ZESTRIL) 5 MG tablet  Take 5 mg by mouth daily.     . Methotrexate Sodium, PF, 50 MG/2ML SOLN Inject 0.8 mLs as directed once a week. Take on sundays    . omeprazole (PRILOSEC) 40 MG capsule Take 1 capsule (40 mg total) by mouth daily. 30 capsule 11  . simvastatin (ZOCOR) 20 MG tablet Take 10 mg by mouth daily.     . TRADJENTA 5 MG TABS tablet TAKE 1 TABLET BY MOUTH DAILY FOR DIABETES 90 tablet 2  . amiodarone (PACERONE) 200 MG tablet Take 1 tablet (200 mg total) by mouth daily. 90 tablet 3   No current facility-administered medications for this visit.    Allergies:   Spironolactone   Social History: History   Social History  . Marital Status: Married    Spouse Name: N/A  . Number of Children: N/A  . Years of Education: N/A    Occupational History  . Tour manager    Social History Main Topics  . Smoking status: Former Smoker -- 0.50 packs/day for 10 years    Types: Cigarettes    Quit date: 11/01/1975  . Smokeless tobacco: Never Used     Comment: quit over 30 years ago   . Alcohol Use: No     Comment: occasionally  . Drug Use: No  . Sexual Activity: Not Currently   Other Topics Concern  . Not on file   Social History Narrative    Family History: Family History  Problem Relation Age of Onset  . Cancer Father     prostate  . Heart disease Mother     Review of Systems: All other systems reviewed and are otherwise negative except as noted above.   Physical Exam: VS:  BP 115/67 mmHg  Pulse 82  Ht 5\' 9"  (1.753 m)  Wt 201 lb 3.2 oz (91.264 kg)  BMI 29.70 kg/m2 , BMI Body mass index is 29.7 kg/(m^2).  GEN- The patient is well appearing, alert and oriented x 3 today.   HEENT: normocephalic, atraumatic; sclera clear, conjunctiva pink; hearing intact; oropharynx clear; neck supple, no JVP Lymph- no cervical lymphadenopathy Lungs- Clear to ausculation bilaterally, normal work of breathing.  No wheezes, rales, rhonchi Heart- Regular rate and rhythm, no murmurs, rubs or gallops, PMI not laterally displaced GI- soft, non-tender, non-distended, bowel sounds present, no hepatosplenomegaly Extremities- no clubbing, cyanosis, or edema; DP/PT/radial pulses 2+ bilaterally MS- no significant deformity or atrophy Skin- warm and dry, no rash or lesion; ICD pocket well healed Psych- euthymic mood, full affect Neuro- strength and sensation are intact  ICD interrogation- reviewed in detail today,  See PACEART report  EKG:  EKG is not ordered today.  Recent Labs: 08/13/2014: Magnesium 1.8 09/19/2014: B Natriuretic Peptide 131.4*; BUN 25*; Creatinine 1.79*; Hemoglobin 10.5*; Platelets 196; Potassium 3.6; Sodium 140 09/21/2014: ALT 29; TSH 1.40   Wt Readings from Last 3 Encounters:  11/02/14 201 lb 3.2 oz  (91.264 kg)  10/12/14 201 lb (91.173 kg)  09/21/14 196 lb 6.4 oz (89.086 kg)     Assessment and Plan:  1.  Ventricular tachycardia No recent recurrence Continue Amiodarone 200mg  daily Recent LFT's/TFT's normal (April 2016) Baseline PFTs ordered today.  Pt is due for annual eye exam at [facility] - he will call to make an appt No driving x6 months from [place] (pt aware)  2. Chronic systolic dysfunction euvolemic today Stable on an appropriate medical regimen Normal ICD function See Pace Art report  3.  HTN Stable No change required today  Current medicines are reviewed at length with the patient today.   The patient does not have concerns regarding his medicines.  The following changes were made today:  none  Labs/ tests ordered today include: PFT's    Disposition:   Follow up with Dr Rayann Heman in July, follow up with Dr Stanford Breed 4 months    Signed, Chanetta Marshall, NP 11/02/2014 11:06 AM  [facility] [address] [place] [address] [phone] (office) [phone] (fax)

## 2014-11-02 ENCOUNTER — Encounter: Payer: Self-pay | Admitting: Nurse Practitioner

## 2014-11-02 ENCOUNTER — Ambulatory Visit (INDEPENDENT_AMBULATORY_CARE_PROVIDER_SITE_OTHER): Payer: Medicare Other | Admitting: Nurse Practitioner

## 2014-11-02 ENCOUNTER — Encounter: Payer: Self-pay | Admitting: Internal Medicine

## 2014-11-02 VITALS — BP 115/67 | HR 82 | Ht 69.0 in | Wt 201.2 lb

## 2014-11-02 DIAGNOSIS — Z95810 Presence of automatic (implantable) cardiac defibrillator: Secondary | ICD-10-CM | POA: Diagnosis not present

## 2014-11-02 DIAGNOSIS — I472 Ventricular tachycardia, unspecified: Secondary | ICD-10-CM

## 2014-11-02 DIAGNOSIS — I429 Cardiomyopathy, unspecified: Secondary | ICD-10-CM

## 2014-11-02 DIAGNOSIS — I10 Essential (primary) hypertension: Secondary | ICD-10-CM | POA: Diagnosis not present

## 2014-11-02 DIAGNOSIS — Z79899 Other long term (current) drug therapy: Secondary | ICD-10-CM | POA: Diagnosis not present

## 2014-11-02 DIAGNOSIS — I428 Other cardiomyopathies: Secondary | ICD-10-CM

## 2014-11-02 LAB — CUP PACEART INCLINIC DEVICE CHECK
Battery Voltage: 2.57 V
HIGH POWER IMPEDANCE MEASURED VALUE: 47 Ohm
Lead Channel Impedance Value: 350 Ohm
Lead Channel Pacing Threshold Amplitude: 1.25 V
Lead Channel Pacing Threshold Pulse Width: 0.5 ms
Lead Channel Setting Pacing Amplitude: 3 V
Lead Channel Setting Pacing Pulse Width: 0.5 ms
Lead Channel Setting Sensing Sensitivity: 0.3 mV
MDC IDC MSMT LEADCHNL RV SENSING INTR AMPL: 8.2 mV
MDC IDC PG SERIAL: 665247
MDC IDC SESS DTM: 20160516161838
MDC IDC SET ZONE DETECTION INTERVAL: 300 ms
MDC IDC STAT BRADY RV PERCENT PACED: 1 %

## 2014-11-02 NOTE — Patient Instructions (Signed)
Medication Instructions:  Your physician recommends that you continue on your current medications as directed. Please refer to the Current Medication list given to you today.   Labwork: None   Testing/Procedures: Your physician has recommended that you have a pulmonary function test. Pulmonary Function Tests are a group of tests that measure how well air moves in and out of your lungs.   Follow-Up: Your physician recommends that you schedule a follow-up appointment in: July 2016 with Dr.Allred  Your physician recommends that you schedule a follow-up appointment next available with Dr.Crenshaw    Any Other Special Instructions Will Be Listed Below (If Applicable).

## 2014-11-02 NOTE — Addendum Note (Signed)
Addended by: Lamar Laundry on: 11/02/2014 11:35 AM   Modules accepted: Orders

## 2014-11-05 ENCOUNTER — Ambulatory Visit (INDEPENDENT_AMBULATORY_CARE_PROVIDER_SITE_OTHER): Payer: Medicare Other | Admitting: Gastroenterology

## 2014-11-05 ENCOUNTER — Encounter: Payer: Self-pay | Admitting: Gastroenterology

## 2014-11-05 VITALS — BP 106/66 | HR 64 | Ht 69.0 in | Wt 200.2 lb

## 2014-11-05 DIAGNOSIS — Z860100 Personal history of colon polyps, unspecified: Secondary | ICD-10-CM

## 2014-11-05 DIAGNOSIS — I4729 Other ventricular tachycardia: Secondary | ICD-10-CM

## 2014-11-05 DIAGNOSIS — K2100 Gastro-esophageal reflux disease with esophagitis, without bleeding: Secondary | ICD-10-CM

## 2014-11-05 DIAGNOSIS — I420 Dilated cardiomyopathy: Secondary | ICD-10-CM

## 2014-11-05 DIAGNOSIS — I472 Ventricular tachycardia, unspecified: Secondary | ICD-10-CM

## 2014-11-05 NOTE — Patient Instructions (Signed)
Thank you, have a nice day.

## 2014-11-05 NOTE — Progress Notes (Signed)
_                                                                                                                History of Present Illness:  [name] is a 74 year old Afro-American male with a nonischemic cardiomyopathy, status post defibrillator placement, history of ventricular tachycardia, diabetes, referred at the request of Dr. Ronnald Ramp for evaluation of chest discomfort.  He was hospitalized in February and again in March for cardiac related issues and pneumonia.  During these times he had diarrhea that subsided, and gas-like chest discomfort which also has subsided.  He denies dysphagia or change in bowel habits.  He currently is taking omeprazole.  He feels that he is back to his baseline state of health and has no ongoing GI issues.  Colonoscopy in 2010 demonstrated a small rectal polyp.  Pathology is not known.   Past Medical History  Diagnosis Date  . Obesity   . Prostate cancer   . Bursitis of elbow     left  . Osteoarthritis   . HTN (hypertension)   . HLD (hyperlipidemia)   . History of DVT (deep vein thrombosis)   . Chronic systolic heart failure     echo 4/13: EF 25-30%, mod MR, mod LAE, PASP 47  . NICM (nonischemic cardiomyopathy)     normal cors by cath in 1990s;  nuclear 8/08: no ischemia; inf/lat/dist ant/apical scar  . Moderate mitral regurgitation     echo 4/13  . Ventricular tachycardia 07/2014  . Diabetes mellitus without complication    Past Surgical History  Procedure Laterality Date  . Transurethral resection of prostate    . Cardiac defibrillator placement  10/09    STJ single chamber ICD   . Hernia repair    . Robot assisted laparoscopic radical prostatectomy  6/08  . Left and right heart catheterization with coronary angiogram N/A 08/13/2014    Procedure: LEFT AND RIGHT HEART CATHETERIZATION WITH CORONARY ANGIOGRAM;  Surgeon: Sinclair Grooms, MD;  Location: [facility] CATH LAB;  Service: Cardiovascular;  Laterality: N/A;   family history  includes Cancer in his father; Heart disease in his mother; Prostate cancer in his father. Current Outpatient Prescriptions  Medication Sig Dispense Refill  . acetaminophen (TYLENOL) 325 MG tablet Take 2 tablets (650 mg total) by mouth every 4 (four) hours as needed for headache or mild pain.    Marland Kitchen aspirin 81 MG tablet Take 81 mg by mouth daily.      . carvedilol (COREG) 25 MG tablet Take 25 mg by mouth 2 (two) times daily with a meal.      . Cholecalciferol (VITAMIN D-3) 1000 UNITS CAPS Take 1,000 Units by mouth 2 (two) times daily.     Marland Kitchen COLCRYS 0.6 MG tablet Take 0.6 mg by mouth 2 (two) times daily as needed (for gout symptoms).     Marland Kitchen digoxin (LANOXIN) 0.125 MG tablet Take 125 mcg by mouth daily.      . Febuxostat 80 MG TABS Take 2  tablets by mouth in the morning and take 1 tablet by mouth in the evening    . folic acid (FOLVITE) 1 MG tablet Take 1 mg by mouth daily.    . furosemide (LASIX) 40 MG tablet Take 40 mg by mouth daily.     Marland Kitchen HYDROcodone-acetaminophen (NORCO) 10-325 MG per tablet Take 1 tablet by mouth every 8 (eight) hours as needed for moderate pain or severe pain.    . Linaclotide (LINZESS) 145 MCG CAPS capsule Take 145 mcg by mouth daily as needed (constipation).    Marland Kitchen lisinopril (PRINIVIL,ZESTRIL) 5 MG tablet Take 5 mg by mouth daily.     . Methotrexate Sodium, PF, 50 MG/2ML SOLN Inject 0.8 mLs as directed once a week. Take on sundays    . omeprazole (PRILOSEC) 40 MG capsule Take 1 capsule (40 mg total) by mouth daily. 30 capsule 11  . simvastatin (ZOCOR) 20 MG tablet Take 10 mg by mouth daily.     . TRADJENTA 5 MG TABS tablet TAKE 1 TABLET BY MOUTH DAILY FOR DIABETES 90 tablet 2  . amiodarone (PACERONE) 200 MG tablet Take 1 tablet (200 mg total) by mouth daily. 90 tablet 3   No current facility-administered medications for this visit.   Allergies as of 11/05/2014 - Review Complete 11/05/2014  Allergen Reaction Noted  . Spironolactone Other (See Comments) 08/31/2011     reports that he quit smoking about 39 years ago. His smoking use included Cigarettes. He has a 5 pack-year smoking history. He has never used smokeless tobacco. He reports that he does not drink alcohol or use illicit drugs.   Review of Systems: Complains of knee pain.  Pertinent positive and negative review of systems were noted in the above HPI section. All other review of systems were otherwise negative.  Vital signs were reviewed in today's medical record Physical Exam: General: Well developed , well nourished, no acute distress Skin: anicteric Head: Normocephalic and atraumatic Eyes:  sclerae anicteric, EOMI Ears: Normal auditory acuity Mouth: No deformity or lesions Neck: Supple, no masses or thyromegaly Lymph Nodes: no lymphadenopathy Lungs: Clear throughout to auscultation Heart: Regular rate and rhythm; no rubs or bruits.  There is a 1/6 early systolic murmur Gastroinestinal: Soft, non tender and non distended. No masses, hepatosplenomegaly or hernias noted. Normal Bowel sounds Rectal:deferred Musculoskeletal: Symmetrical with no gross deformities  Skin: No lesions on visible extremities Pulses:  Normal pulses noted Extremities: No clubbing, cyanosis, edema or deformities noted Neurological: Alert oriented x 4, grossly nonfocal Cervical Nodes:  No significant cervical adenopathy Inguinal Nodes: No significant inguinal adenopathy Psychological:  Alert and cooperative. Normal mood and affect  See Assessment and Plan under Problem List

## 2014-11-05 NOTE — Assessment & Plan Note (Addendum)
At this time symptoms are well controlled with omeprazole.  Plan to continue with the same.  CC Dr. Ronnald Ramp

## 2014-11-05 NOTE — Assessment & Plan Note (Signed)
Patient is at increased risk for any procedures in view of his cardiomyopathy.  Will defer any endoscopic procedures unless he develops specific GI issues.

## 2014-11-17 ENCOUNTER — Encounter: Payer: Self-pay | Admitting: Internal Medicine

## 2014-11-24 ENCOUNTER — Encounter: Payer: Self-pay | Admitting: Internal Medicine

## 2014-11-24 LAB — CBC WITH DIFFERENTIAL
A/G RATIO: 1
ALT: 82
AST: 45 U/L
Albumin: 3.5
Alkaline Phosphatase: 82 U/L
BILIRUBIN TOTAL: 0.7 mg/dL
BUN/Creatinine Ratio: 16.9
BUN: 27
CO2: 31 mmol/L
CREATININE: 1.6
Calcium: 8.5 mg/dL
Chloride: 107 mmol/L
EGFR (Non-African Amer.): 45.25
GFR CALC AF AMER: 54.84
GLOBULIN: 3.4
GLUCOSE: 132
HCT: 36.3
HGB: 11.4 g/dL
PLATELETS: 185
POTASSIUM: 4 mmol/L
RBC: 4.29
SODIUM: 141
TOTAL PROTEIN: 6.9 g/dL
WBC: 5.1

## 2014-11-30 ENCOUNTER — Ambulatory Visit (INDEPENDENT_AMBULATORY_CARE_PROVIDER_SITE_OTHER): Payer: Medicare Other | Admitting: Internal Medicine

## 2014-11-30 ENCOUNTER — Other Ambulatory Visit: Payer: Self-pay | Admitting: Internal Medicine

## 2014-11-30 DIAGNOSIS — R0600 Dyspnea, unspecified: Secondary | ICD-10-CM

## 2014-11-30 LAB — PULMONARY FUNCTION TEST
DL/VA % pred: 105 %
DL/VA: 4.78 ml/min/mmHg/L
DLCO UNC % PRED: 64 %
DLCO unc: 20.01 ml/min/mmHg
FEF 25-75 Post: 3.34 L/sec
FEF 25-75 Pre: 2.79 L/sec
FEF2575-%CHANGE-POST: 19 %
FEF2575-%PRED-POST: 147 %
FEF2575-%Pred-Pre: 123 %
FEV1-%CHANGE-POST: 3 %
FEV1-%PRED-POST: 78 %
FEV1-%PRED-PRE: 76 %
FEV1-PRE: 2.05 L
FEV1-Post: 2.12 L
FEV1FVC-%CHANGE-POST: 2 %
FEV1FVC-%PRED-PRE: 114 %
FEV6-%Change-Post: 0 %
FEV6-%Pred-Post: 69 %
FEV6-%Pred-Pre: 68 %
FEV6-Post: 2.38 L
FEV6-Pre: 2.36 L
FEV6FVC-%Change-Post: 0 %
FEV6FVC-%Pred-Post: 105 %
FEV6FVC-%Pred-Pre: 105 %
FVC-%Change-Post: 0 %
FVC-%PRED-PRE: 65 %
FVC-%Pred-Post: 65 %
FVC-Post: 2.38 L
FVC-Pre: 2.36 L
POST FEV1/FVC RATIO: 89 %
PRE FEV6/FVC RATIO: 100 %
Post FEV6/FVC ratio: 100 %
Pre FEV1/FVC ratio: 87 %
RV % PRED: 57 %
RV: 1.41 L
TLC % PRED: 58 %
TLC: 3.97 L

## 2014-11-30 NOTE — Progress Notes (Signed)
PFT done today. 

## 2014-12-14 ENCOUNTER — Ambulatory Visit (INDEPENDENT_AMBULATORY_CARE_PROVIDER_SITE_OTHER): Payer: Medicare Other | Admitting: Internal Medicine

## 2014-12-14 ENCOUNTER — Other Ambulatory Visit: Payer: Self-pay | Admitting: *Deleted

## 2014-12-14 ENCOUNTER — Encounter: Payer: Self-pay | Admitting: Internal Medicine

## 2014-12-14 ENCOUNTER — Other Ambulatory Visit (INDEPENDENT_AMBULATORY_CARE_PROVIDER_SITE_OTHER): Payer: Medicare Other

## 2014-12-14 VITALS — BP 114/68 | HR 73 | Temp 97.7°F | Resp 12 | Wt 196.1 lb

## 2014-12-14 DIAGNOSIS — I420 Dilated cardiomyopathy: Secondary | ICD-10-CM

## 2014-12-14 DIAGNOSIS — I429 Cardiomyopathy, unspecified: Secondary | ICD-10-CM

## 2014-12-14 DIAGNOSIS — N189 Chronic kidney disease, unspecified: Secondary | ICD-10-CM

## 2014-12-14 DIAGNOSIS — I10 Essential (primary) hypertension: Secondary | ICD-10-CM

## 2014-12-14 DIAGNOSIS — N183 Chronic kidney disease, stage 3 unspecified: Secondary | ICD-10-CM

## 2014-12-14 DIAGNOSIS — G4489 Other headache syndrome: Secondary | ICD-10-CM | POA: Diagnosis not present

## 2014-12-14 DIAGNOSIS — I5022 Chronic systolic (congestive) heart failure: Secondary | ICD-10-CM | POA: Diagnosis not present

## 2014-12-14 DIAGNOSIS — E1122 Type 2 diabetes mellitus with diabetic chronic kidney disease: Secondary | ICD-10-CM

## 2014-12-14 DIAGNOSIS — R5383 Other fatigue: Secondary | ICD-10-CM

## 2014-12-14 DIAGNOSIS — I428 Other cardiomyopathies: Secondary | ICD-10-CM

## 2014-12-14 LAB — CARDIAC PANEL
CK TOTAL: 63 U/L (ref 7–232)
CK-MB: 1.6 ng/mL (ref 0.3–4.0)
RELATIVE INDEX: 2.5 calc (ref 0.0–2.5)

## 2014-12-14 LAB — TSH: TSH: 1.57 u[IU]/mL (ref 0.35–4.50)

## 2014-12-14 LAB — COMPREHENSIVE METABOLIC PANEL
ALBUMIN: 3.6 g/dL (ref 3.5–5.2)
ALT: 47 U/L (ref 0–53)
AST: 32 U/L (ref 0–37)
Alkaline Phosphatase: 60 U/L (ref 39–117)
BUN: 29 mg/dL — ABNORMAL HIGH (ref 6–23)
CALCIUM: 9 mg/dL (ref 8.4–10.5)
CHLORIDE: 111 meq/L (ref 96–112)
CO2: 26 mEq/L (ref 19–32)
Creatinine, Ser: 1.51 mg/dL — ABNORMAL HIGH (ref 0.40–1.50)
GFR: 58.51 mL/min — ABNORMAL LOW (ref >60.00)
Glucose, Bld: 85 mg/dL (ref 70–99)
POTASSIUM: 3.9 meq/L (ref 3.5–5.1)
SODIUM: 142 meq/L (ref 135–145)
Total Bilirubin: 0.5 mg/dL (ref 0.2–1.2)
Total Protein: 6.8 g/dL (ref 6.0–8.3)

## 2014-12-14 LAB — CBC WITH DIFFERENTIAL/PLATELET
BASOS PCT: 0.4 % (ref 0.0–3.0)
Basophils Absolute: 0 10*3/uL (ref 0.0–0.1)
EOS ABS: 0.1 10*3/uL (ref 0.0–0.7)
EOS PCT: 1.1 % (ref 0.0–5.0)
HCT: 38.5 % — ABNORMAL LOW (ref 39.0–52.0)
HEMOGLOBIN: 12.6 g/dL — AB (ref 13.0–17.0)
LYMPHS PCT: 15.9 % (ref 12.0–46.0)
Lymphs Abs: 0.9 10*3/uL (ref 0.7–4.0)
MCHC: 32.6 g/dL (ref 30.0–36.0)
MCV: 79.9 fl (ref 78.0–100.0)
Monocytes Absolute: 0.9 10*3/uL (ref 0.1–1.0)
Monocytes Relative: 15.3 % — ABNORMAL HIGH (ref 3.0–12.0)
NEUTROS ABS: 3.8 10*3/uL (ref 1.4–7.7)
Neutrophils Relative %: 67.3 % (ref 43.0–77.0)
Platelets: 175 10*3/uL (ref 150.0–400.0)
RBC: 4.82 Mil/uL (ref 4.22–5.81)
RDW: 18 % — ABNORMAL HIGH (ref 11.5–15.5)
WBC: 5.6 10*3/uL (ref 4.0–10.5)

## 2014-12-14 LAB — SEDIMENTATION RATE: Sed Rate: 19 mm/hr (ref 0–22)

## 2014-12-14 LAB — TROPONIN I: TNIDX: 0.03 ug/L (ref 0.00–0.06)

## 2014-12-14 LAB — HEMOGLOBIN A1C: Hgb A1c MFr Bld: 6 % (ref 4.6–6.5)

## 2014-12-14 NOTE — Patient Instructions (Signed)

## 2014-12-14 NOTE — Progress Notes (Signed)
Subjective:  Patient ID: [name], male    DOB: 05/05/1941  Age: 74 y.o. MRN: [id number]  CC: Headache   HPI Katrina L Deshler presents for a 2 week hx of aching in his frontal scalp area and an episode of fatigue that occurred yesterday - he was in the bed for 2 hours with severe fatigue.  Outpatient Prescriptions Prior to Visit  Medication Sig Dispense Refill  . acetaminophen (TYLENOL) 325 MG tablet Take 2 tablets (650 mg total) by mouth every 4 (four) hours as needed for headache or mild pain.    Marland Kitchen aspirin 81 MG tablet Take 81 mg by mouth daily.      . carvedilol (COREG) 25 MG tablet Take 25 mg by mouth 2 (two) times daily with a meal.      . Cholecalciferol (VITAMIN D-3) 1000 UNITS CAPS Take 1,000 Units by mouth 2 (two) times daily.     Marland Kitchen COLCRYS 0.6 MG tablet Take 0.6 mg by mouth 2 (two) times daily as needed (for gout symptoms).     Marland Kitchen digoxin (LANOXIN) 0.125 MG tablet Take 125 mcg by mouth daily.      . Febuxostat 80 MG TABS Take 2 tablets by mouth in the morning and take 1 tablet by mouth in the evening    . folic acid (FOLVITE) 1 MG tablet Take 1 mg by mouth daily.    . furosemide (LASIX) 40 MG tablet Take 40 mg by mouth daily.     Marland Kitchen HYDROcodone-acetaminophen (NORCO) 10-325 MG per tablet Take 1 tablet by mouth every 8 (eight) hours as needed for moderate pain or severe pain.    . Linaclotide (LINZESS) 145 MCG CAPS capsule Take 145 mcg by mouth daily as needed (constipation).    Marland Kitchen lisinopril (PRINIVIL,ZESTRIL) 5 MG tablet Take 5 mg by mouth daily.     . Methotrexate Sodium, PF, 50 MG/2ML SOLN Inject 0.8 mLs as directed once a week. Take on sundays    . omeprazole (PRILOSEC) 40 MG capsule Take 1 capsule (40 mg total) by mouth daily. 30 capsule 11  . simvastatin (ZOCOR) 20 MG tablet Take 10 mg by mouth daily.     . TRADJENTA 5 MG TABS tablet TAKE 1 TABLET BY MOUTH DAILY FOR DIABETES 90 tablet 2  . amiodarone (PACERONE) 200 MG tablet Take 1 tablet (200 mg total) by mouth daily. 90  tablet 3   No facility-administered medications prior to visit.    ROS Review of Systems  Constitutional: Positive for fatigue. Negative for fever, chills, diaphoresis, appetite change and unexpected weight change.  HENT: Negative for trouble swallowing and voice change.   Eyes: Negative.   Respiratory: Negative.  Negative for cough, choking, chest tightness, shortness of breath, wheezing and stridor.   Cardiovascular: Negative.  Negative for chest pain, palpitations and leg swelling.  Gastrointestinal: Negative.  Negative for nausea, vomiting, abdominal pain, diarrhea, constipation and blood in stool.  Endocrine: Negative.   Genitourinary: Negative.   Musculoskeletal: Negative.  Negative for myalgias, back pain, arthralgias and neck pain.  Skin: Negative.   Allergic/Immunologic: Negative.   Neurological: Positive for headaches. Negative for dizziness, tremors, seizures, syncope, speech difficulty, weakness, light-headedness and numbness.  Hematological: Negative.  Negative for adenopathy. Does not bruise/bleed easily.  Psychiatric/Behavioral: Negative.     Objective:  BP 114/68 mmHg  Pulse 73  Temp(Src) 97.7 F (36.5 C) (Oral)  Resp 12  Wt 196 lb 1.3 oz (88.941 kg)  SpO2 96%  BP Readings from  Last 3 Encounters:  12/14/14 114/68  11/05/14 106/66  11/02/14 115/67    Wt Readings from Last 3 Encounters:  12/14/14 196 lb 1.3 oz (88.941 kg)  11/05/14 200 lb 3.2 oz (90.81 kg)  11/02/14 201 lb 3.2 oz (91.264 kg)    Physical Exam  Constitutional: He is oriented to person, place, and time. He appears well-developed and well-nourished. No distress.  HENT:  Head: Normocephalic and atraumatic.  Mouth/Throat: Oropharynx is clear and moist. No oropharyngeal exudate.  Eyes: Conjunctivae are normal. Right eye exhibits no discharge. Left eye exhibits no discharge. No scleral icterus.  Neck: Normal range of motion. Neck supple. No JVD present. No tracheal deviation present. No  thyromegaly present.  Cardiovascular: Normal rate, regular rhythm and intact distal pulses.  Exam reveals no gallop and no friction rub.   Murmur heard. EKG today  Sinus  Rhythm  Low voltage in limb leads.   -Intraventricular conduction defect and left axis -possible anterior fascicular block   consider ventricular hypertrophy.   -  Nonspecific T-abnormality.   ABNORMAL   Pulmonary/Chest: Effort normal and breath sounds normal. No stridor. No respiratory distress. He has no wheezes. He has no rales. He exhibits no tenderness.  Abdominal: Soft. Bowel sounds are normal. He exhibits no distension and no mass. There is no tenderness. There is no rebound and no guarding.  Musculoskeletal: Normal range of motion. He exhibits no edema or tenderness.  Lymphadenopathy:    He has no cervical adenopathy.  Neurological: He is alert and oriented to person, place, and time. He has normal strength. He displays no atrophy, no tremor and normal reflexes. No cranial nerve deficit or sensory deficit. He exhibits normal muscle tone. He displays a negative Romberg sign. He displays no seizure activity. Coordination and gait normal. He displays no Babinski's sign on the right side. He displays no Babinski's sign on the left side.  Reflex Scores:      Tricep reflexes are 1+ on the right side and 1+ on the left side.      Bicep reflexes are 1+ on the right side and 1+ on the left side.      Brachioradialis reflexes are 1+ on the right side and 1+ on the left side.      Patellar reflexes are 1+ on the right side and 1+ on the left side.      Achilles reflexes are 1+ on the right side and 1+ on the left side. Skin: Skin is warm and dry. No rash noted. He is not diaphoretic. No erythema. No pallor.  Psychiatric: He has a normal mood and affect. His behavior is normal. Judgment and thought content normal.    Lab Results  Component Value Date   WBC 5.6 12/14/2014   HGB 12.6* 12/14/2014   HCT 38.5* 12/14/2014    PLT 175.0 12/14/2014   GLUCOSE 85 12/14/2014   CHOL 117 08/13/2014   TRIG 48 08/13/2014   HDL 40 08/13/2014   LDLCALC 67 08/13/2014   ALT 47 12/14/2014   AST 32 12/14/2014   NA 142 12/14/2014   K 3.9 12/14/2014   CL 111 12/14/2014   CREATININE 1.51* 12/14/2014   BUN 29* 12/14/2014   CO2 26 12/14/2014   TSH 1.57 12/14/2014   PSA <0.01 ng/mL* 11/09/2008   INR 3.08* 09/02/2014   HGBA1C 6.0 12/14/2014   MICROALBUR 1.0 07/28/2014    Dg Chest 2 View  09/20/2014   CLINICAL DATA:  Acute onset of dizziness and funny sensation  in chest. Initial encounter.  EXAM: CHEST  2 VIEW  COMPARISON:  Chest radiograph performed 09/14/2014  FINDINGS: The lungs are well-aerated and clear. There is no evidence of focal opacification, pleural effusion or pneumothorax.  The heart is borderline normal in size. An AICD is noted at the left chest wall, with a single lead ending at the right ventricle. No acute osseous abnormalities are seen.  IMPRESSION: No acute cardiopulmonary process seen.   Electronically Signed   By: Garald Balding M.D.   On: 09/20/2014 00:04    Assessment & Plan:   Andy was seen today for headache.  Diagnoses and all orders for this visit:  Type 2 diabetes mellitus with diabetic chronic kidney disease - his blood sugars are well controlled Orders: -     Comprehensive metabolic panel; Future -     Hemoglobin A1c; Future  Essential hypertension, benign - BP is well controlled, lytes and renal function are stable Orders: -     Comprehensive metabolic panel; Future -     CBC with Differential/Platelet; Future -     TSH; Future  Chronic renal disease, stage III Orders: -     Comprehensive metabolic panel; Future -     CBC with Differential/Platelet; Future  Congestive dilated cardiomyopathy - His EKG is unchanged, he has a normal volume status and labs are normal, his CE's are normal, I don't think his fatigue is related to his cardiac status. Orders: -     Digoxin level;  Future -     Cardiac panel; Future -     Troponin I; Future  Systolic CHF, chronic Orders: -     Digoxin level; Future -     Cardiac panel; Future -     Troponin I; Future  Other headache syndrome - his exam and labs are normal, will get a CT scan to see if there is a mass or bleed Orders: -     Sedimentation rate; Future -     CT Head Wo Contrast; Future   I am having Mr. Demma maintain his aspirin, carvedilol, digoxin, furosemide, COLCRYS, lisinopril, methotrexate (PF), acetaminophen, simvastatin, Vitamin D-3, TRADJENTA, folic acid, Febuxostat, Linaclotide, amiodarone, omeprazole, and HYDROcodone-acetaminophen.  No orders of the defined types were placed in this encounter.     Follow-up: Return in about 3 weeks (around 01/04/2015).  Scarlette Calico, MD

## 2014-12-15 LAB — DIGOXIN LEVEL: Digoxin Level: 0.7 ug/L — ABNORMAL LOW (ref 0.8–2.0)

## 2014-12-17 ENCOUNTER — Ambulatory Visit (INDEPENDENT_AMBULATORY_CARE_PROVIDER_SITE_OTHER)
Admission: RE | Admit: 2014-12-17 | Discharge: 2014-12-17 | Disposition: A | Discharge status: Home / Self Care | Payer: Medicare Other | Source: Ambulatory Visit | Attending: Internal Medicine | Admitting: Internal Medicine

## 2014-12-17 ENCOUNTER — Encounter: Payer: Self-pay | Admitting: Internal Medicine

## 2014-12-17 DIAGNOSIS — G4489 Other headache syndrome: Secondary | ICD-10-CM

## 2015-01-05 ENCOUNTER — Ambulatory Visit (INDEPENDENT_AMBULATORY_CARE_PROVIDER_SITE_OTHER): Payer: Medicare Other | Admitting: Internal Medicine

## 2015-01-05 ENCOUNTER — Encounter: Payer: Self-pay | Admitting: Internal Medicine

## 2015-01-05 VITALS — BP 112/80 | HR 70 | Temp 97.7°F | Resp 16 | Ht 69.0 in | Wt 197.5 lb

## 2015-01-05 DIAGNOSIS — I10 Essential (primary) hypertension: Secondary | ICD-10-CM | POA: Diagnosis not present

## 2015-01-05 DIAGNOSIS — Z23 Encounter for immunization: Secondary | ICD-10-CM

## 2015-01-05 NOTE — Patient Instructions (Signed)

## 2015-01-06 NOTE — Progress Notes (Signed)
Subjective:  Patient ID: [name], male    DOB: [birth year]  Age: 74 y.o. MRN: [id number]  CC: Headache   HPI [name] presents for a follow-up on his recent headache. The pain has resolved. His only complaint at this time is that he occasionally feels a hot sensation on the top of his scalp.  Outpatient Prescriptions Prior to Visit  Medication Sig Dispense Refill  . acetaminophen (TYLENOL) 325 MG tablet Take 2 tablets (650 mg total) by mouth every 4 (four) hours as needed for headache or mild pain.    Marland Kitchen amiodarone (PACERONE) 200 MG tablet Take 1 tablet (200 mg total) by mouth daily. 90 tablet 3  . aspirin 81 MG tablet Take 81 mg by mouth daily.      . carvedilol (COREG) 25 MG tablet Take 25 mg by mouth 2 (two) times daily with a meal.      . Cholecalciferol (VITAMIN D-3) 1000 UNITS CAPS Take 1,000 Units by mouth 2 (two) times daily.     Marland Kitchen COLCRYS 0.6 MG tablet Take 0.6 mg by mouth 2 (two) times daily as needed (for gout symptoms).     Marland Kitchen digoxin (LANOXIN) 0.125 MG tablet Take 125 mcg by mouth daily.      . Febuxostat 80 MG TABS Take 2 tablets by mouth in the morning and take 1 tablet by mouth in the evening    . folic acid (FOLVITE) 1 MG tablet Take 1 mg by mouth daily.    . furosemide (LASIX) 40 MG tablet Take 40 mg by mouth daily.     Marland Kitchen HYDROcodone-acetaminophen (NORCO) 10-325 MG per tablet Take 1 tablet by mouth every 8 (eight) hours as needed for moderate pain or severe pain.    . Linaclotide (LINZESS) 145 MCG CAPS capsule Take 145 mcg by mouth daily as needed (constipation).    Marland Kitchen lisinopril (PRINIVIL,ZESTRIL) 5 MG tablet Take 5 mg by mouth daily.     . Methotrexate Sodium, PF, 50 MG/2ML SOLN Inject 0.8 mLs as directed once a week. Take on sundays    . omeprazole (PRILOSEC) 40 MG capsule Take 1 capsule (40 mg total) by mouth daily. 30 capsule 11  . simvastatin (ZOCOR) 20 MG tablet Take 10 mg by mouth daily.     . TRADJENTA 5 MG TABS tablet TAKE 1 TABLET BY MOUTH DAILY FOR  DIABETES 90 tablet 2   No facility-administered medications prior to visit.    ROS Review of Systems  Constitutional: Negative.  Negative for fever, chills, diaphoresis, appetite change and fatigue.  HENT: Negative.   Eyes: Negative.   Respiratory: Negative.  Negative for cough, choking, chest tightness, shortness of breath and stridor.   Cardiovascular: Negative.  Negative for chest pain, palpitations and leg swelling.  Gastrointestinal: Negative.  Negative for abdominal pain.  Endocrine: Negative.   Genitourinary: Negative.  Negative for difficulty urinating.  Musculoskeletal: Negative.   Skin: Negative.   Allergic/Immunologic: Negative.   Neurological: Negative.  Negative for dizziness, tremors, syncope, facial asymmetry, light-headedness, numbness and headaches.  Hematological: Negative.  Negative for adenopathy. Does not bruise/bleed easily.  Psychiatric/Behavioral: Negative.     Objective:  BP 112/80 mmHg  Pulse 70  Temp(Src) 97.7 F (36.5 C) (Oral)  Resp 16  Ht 5\' 9"  (1.753 m)  Wt 197 lb 8 oz (89.585 kg)  BMI 29.15 kg/m2  SpO2 98%  BP Readings from Last 3 Encounters:  01/05/15 112/80  12/14/14 114/68  11/05/14 106/66    Wt  Readings from Last 3 Encounters:  01/05/15 197 lb 8 oz (89.585 kg)  12/14/14 196 lb 1.3 oz (88.941 kg)  11/05/14 200 lb 3.2 oz (90.81 kg)    Physical Exam  Constitutional: He is oriented to person, place, and time. No distress.  HENT:  Mouth/Throat: Oropharynx is clear and moist. No oropharyngeal exudate.  Eyes: Conjunctivae are normal. Right eye exhibits no discharge. Left eye exhibits no discharge. No scleral icterus.  Neck: Normal range of motion. Neck supple. No JVD present. No tracheal deviation present. No thyromegaly present.  Cardiovascular: Normal rate, regular rhythm and intact distal pulses.  Exam reveals no gallop and no friction rub.   Murmur heard. Pulmonary/Chest: Effort normal and breath sounds normal. No stridor. No  respiratory distress. He has no wheezes. He has no rales. He exhibits no tenderness.  Abdominal: Soft. Bowel sounds are normal. He exhibits no distension and no mass. There is no tenderness. There is no rebound and no guarding.  Musculoskeletal: Normal range of motion. He exhibits no edema or tenderness.  Lymphadenopathy:    He has no cervical adenopathy.  Neurological: He is alert and oriented to person, place, and time. He has normal reflexes. He displays normal reflexes. No cranial nerve deficit. He exhibits normal muscle tone. Coordination normal.  Skin: Skin is warm and dry. No rash noted. He is not diaphoretic. No erythema. No pallor.  Psychiatric: He has a normal mood and affect. His behavior is normal. Judgment and thought content normal.    Lab Results  Component Value Date   WBC 5.6 12/14/2014   HGB 12.6* 12/14/2014   HCT 38.5* 12/14/2014   PLT 175.0 12/14/2014   GLUCOSE 85 12/14/2014   CHOL 117 08/13/2014   TRIG 48 08/13/2014   HDL 40 08/13/2014   LDLCALC 67 08/13/2014   ALT 47 12/14/2014   AST 32 12/14/2014   NA 142 12/14/2014   K 3.9 12/14/2014   CL 111 12/14/2014   CREATININE 1.51* 12/14/2014   BUN 29* 12/14/2014   CO2 26 12/14/2014   TSH 1.57 12/14/2014   PSA <0.01 ng/mL* 11/09/2008   INR 3.08* 09/02/2014   HGBA1C 6.0 12/14/2014   MICROALBUR 1.0 07/28/2014    Ct Head Wo Contrast  12/17/2014   CLINICAL DATA:  Daily headaches began several months ago, some visual changes  EXAM: CT HEAD WITHOUT CONTRAST  TECHNIQUE: Contiguous axial images were obtained from the base of the skull through the vertex without intravenous contrast.  COMPARISON:  Dictated report from CT brain of 07/19/1999  FINDINGS: The ventricular system is normal in size and configuration and the septum is in a midline position. The fourth ventricle and basilar cisterns are unremarkable. No hemorrhage, mass lesion, or acute infarction is seen. On bone window images, no calvarial abnormality is noted. The  paranasal sinuses are clear.  IMPRESSION: Negative unenhanced CT of the brain.   Electronically Signed   By: Ivar Drape M.D.   On: 12/17/2014 11:38    Assessment & Plan:   Treon was seen today for headache.  Diagnoses and all orders for this visit:  Need for prophylactic vaccination and inoculation against unspecified single disease Orders: -     Varicella-zoster vaccine subcutaneous  Essential hypertension, benign- his blood pressure is well controlled and he has a normal fluid status today. The recent CT scan of his brain was normal. His headaches have resolved. The sensation on his scalp sounds like a benign issue to me. Will follow for now.  I am having Mr. Bollman maintain his aspirin, carvedilol, digoxin, furosemide, COLCRYS, lisinopril, methotrexate (PF), acetaminophen, simvastatin, Vitamin D-3, TRADJENTA, folic acid, Febuxostat, Linaclotide, amiodarone, omeprazole, and HYDROcodone-acetaminophen.  No orders of the defined types were placed in this encounter.     Follow-up: Return in about 3 months (around 04/07/2015).  Scarlette Calico, MD

## 2015-01-13 ENCOUNTER — Encounter: Payer: Self-pay | Admitting: Internal Medicine

## 2015-01-13 ENCOUNTER — Ambulatory Visit (INDEPENDENT_AMBULATORY_CARE_PROVIDER_SITE_OTHER): Payer: Medicare Other | Admitting: Internal Medicine

## 2015-01-13 VITALS — BP 114/78 | HR 77 | Ht 69.0 in | Wt 198.4 lb

## 2015-01-13 DIAGNOSIS — I429 Cardiomyopathy, unspecified: Secondary | ICD-10-CM

## 2015-01-13 DIAGNOSIS — R942 Abnormal results of pulmonary function studies: Secondary | ICD-10-CM

## 2015-01-13 DIAGNOSIS — I5022 Chronic systolic (congestive) heart failure: Secondary | ICD-10-CM

## 2015-01-13 DIAGNOSIS — I420 Dilated cardiomyopathy: Secondary | ICD-10-CM | POA: Diagnosis not present

## 2015-01-13 DIAGNOSIS — I428 Other cardiomyopathies: Secondary | ICD-10-CM

## 2015-01-13 DIAGNOSIS — I472 Ventricular tachycardia: Secondary | ICD-10-CM | POA: Diagnosis not present

## 2015-01-13 DIAGNOSIS — I4729 Other ventricular tachycardia: Secondary | ICD-10-CM

## 2015-01-13 DIAGNOSIS — J984 Other disorders of lung: Secondary | ICD-10-CM

## 2015-01-13 NOTE — Patient Instructions (Addendum)
Medication Instructions:  Your physician has recommended you make the following change in your medication:  1) Decrease Amiodarone to 100mg  daily    Labwork: None ordered  Testing/Procedures: None ordered  Follow-Up: You have been referred to Dr Dorette Grate up on PFT's     Your physician recommends that you schedule a follow-up appointment in: 3 months with Chanetta Marshall, NP  Any Other Special Instructions Will Be Listed Below (If Applicable).

## 2015-01-13 NOTE — Progress Notes (Signed)
Electrophysiology Office Note   Date:  01/13/2015   ID:  [name], [name] Oct 03, 1940, MRN [id number]  PCP:  Scarlette Calico, MD  Cardiologist:  Dr Stanford Breed Primary Electrophysiologist: Thompson Grayer, MD    Chief Complaint  Patient presents with  . PVT  . Congestive dilated cardiomyopathy     History of Present Illness: [name] is a 74 y.o. male who presents today for electrophysiology evaluation.  He has had no further ventricular arrhythias.  He has occasional SOB and feels that this has recently worsened. Today, he denies symptoms of exertional chest pain,  orthopnea, PND, lower extremity edema, claudication, dizziness, presyncope, syncope, bleeding, or neurologic sequela. The patient is tolerating medications without difficulties and is otherwise without complaint today.    Past Medical History  Diagnosis Date  . Obesity   . Prostate cancer   . Bursitis of elbow     left  . Osteoarthritis   . HTN (hypertension)   . HLD (hyperlipidemia)   . History of DVT (deep vein thrombosis)   . Chronic systolic heart failure     echo 4/13: EF 25-30%, mod MR, mod LAE, PASP 47  . NICM (nonischemic cardiomyopathy)     normal cors by cath in 1990s;  nuclear 8/08: no ischemia; inf/lat/dist ant/apical scar  . Moderate mitral regurgitation     echo 4/13  . Ventricular tachycardia 07/2014  . Diabetes mellitus without complication    Past Surgical History  Procedure Laterality Date  . Transurethral resection of prostate    . Cardiac defibrillator placement  10/09    STJ single chamber ICD   . Hernia repair    . Robot assisted laparoscopic radical prostatectomy  6/08  . Left and right heart catheterization with coronary angiogram N/A 08/13/2014    Procedure: LEFT AND RIGHT HEART CATHETERIZATION WITH CORONARY ANGIOGRAM;  Surgeon: Sinclair Grooms, MD;  Location: [facility] CATH LAB;  Service: Cardiovascular;  Laterality: N/A;     Current Outpatient Prescriptions  Medication Sig Dispense  Refill  . acetaminophen (TYLENOL) 325 MG tablet Take 2 tablets (650 mg total) by mouth every 4 (four) hours as needed for headache or mild pain.    Marland Kitchen amiodarone (PACERONE) 200 MG tablet Take 100 mg by mouth daily.  3  . aspirin 81 MG tablet Take 81 mg by mouth daily.      . carvedilol (COREG) 25 MG tablet Take 25 mg by mouth 2 (two) times daily with a meal.      . Cholecalciferol (VITAMIN D-3) 1000 UNITS CAPS Take 1,000 Units by mouth 2 (two) times daily.     Marland Kitchen COLCRYS 0.6 MG tablet Take 0.6 mg by mouth 2 (two) times daily as needed (for gout symptoms).     Marland Kitchen digoxin (LANOXIN) 0.125 MG tablet Take 125 mcg by mouth daily.      . Febuxostat 80 MG TABS Take 2 tablets by mouth in the morning and take 1 tablet by mouth in the evening    . folic acid (FOLVITE) 1 MG tablet Take 1 mg by mouth daily.    . furosemide (LASIX) 40 MG tablet Take 40 mg by mouth daily.     Marland Kitchen HYDROcodone-acetaminophen (NORCO) 10-325 MG per tablet Take 1 tablet by mouth every 8 (eight) hours as needed for moderate pain or severe pain.    . Linaclotide (LINZESS) 145 MCG CAPS capsule Take 145 mcg by mouth daily as needed (constipation).    Marland Kitchen lisinopril (PRINIVIL,ZESTRIL) 5 MG  tablet Take 5 mg by mouth daily.     . Methotrexate Sodium, PF, 50 MG/2ML SOLN Inject 0.8 mLs as directed once a week. Take on sundays    . omeprazole (PRILOSEC) 40 MG capsule Take 1 capsule (40 mg total) by mouth daily. 30 capsule 11  . simvastatin (ZOCOR) 20 MG tablet Take 10 mg by mouth daily.     . TRADJENTA 5 MG TABS tablet TAKE 1 TABLET BY MOUTH DAILY FOR DIABETES 90 tablet 2   No current facility-administered medications for this visit.    Allergies:   Spironolactone   Social History:  The patient  reports that he quit smoking about 39 years ago. His smoking use included Cigarettes. He has a 5 pack-year smoking history. He has never used smokeless tobacco. He reports that he does not drink alcohol or use illicit drugs.   Family History:  The  patient's family history includes Cancer in his father; Heart disease in his mother; Prostate cancer in his father.    ROS:  Please see the history of present illness.   All other systems are reviewed and negative.    PHYSICAL EXAM: VS:  BP 114/78 mmHg  Pulse 77  Ht 5\' 9"  (1.753 m)  Wt 89.994 kg (198 lb 6.4 oz)  BMI 29.29 kg/m2 , BMI Body mass index is 29.29 kg/(m^2). GEN: Well nourished, well developed, in no acute distress HEENT: normal Neck: no JVD, carotid bruits, or masses Cardiac: RRR; no murmurs, rubs, or gallops,no edema  Respiratory:  clear to auscultation bilaterally, normal work of breathing GI: soft, nontender, nondistended, + BS MS: no deformity or atrophy Skin: warm and dry, device pocket is well healed Neuro:  Strength and sensation are intact Psych: euthymic mood, full affect  PFTs are reviewed and reveal moderate restriction  Device interrogation is reviewed today in detail.  See PaceArt for details.   Recent Labs: 08/13/2014: Magnesium 1.8 09/19/2014: B Natriuretic Peptide 131.4* 12/14/2014: ALT 47; BUN 29*; Creatinine, Ser 1.51*; Hemoglobin 12.6*; Platelets 175.0; Potassium 3.9; Sodium 142; TSH 1.57    Lipid Panel     Component Value Date/Time   CHOL 117 08/13/2014 0253   TRIG 48 08/13/2014 0253   HDL 40 08/13/2014 0253   CHOLHDL 2.9 08/13/2014 0253   VLDL 10 08/13/2014 0253   LDLCALC 67 08/13/2014 0253     Wt Readings from Last 3 Encounters:  01/13/15 89.994 kg (198 lb 6.4 oz)  01/05/15 89.585 kg (197 lb 8 oz)  12/14/14 88.941 kg (196 lb 1.3 oz)     ASSESSMENT AND PLAN:  1.  VT No further VT Reduce amiodarone to 100mg  daily today given recent PFT findings Normal ICD function See Pace Art report No changes today Risks of amiodarone were discussed with patient and spouse and they would like to continue this medicine at this time.  2. Chronic systolic dysfunction Stable No change required today euvolemic on exam  3. HTN Stable No  change required today  4. Pulmonary restrictive process Recent PFTs reviewed Given worsening SOB, will refer to pulmonary Will reduce amiodarone to 100mg  daily today  Current medicines are reviewed at length with the patient today.   The patient does not have concerns regarding his medicines.  The following changes were made today:  none   Follow-up: merlin, return to see EP NP in 3 months  Follow-up with Dr Stanford Breed as scheduled  Signed, Thompson Grayer, MD  01/13/2015 10:40 PM     [address] [place] [address] [phone] (office) [phone] (fax)

## 2015-01-14 LAB — CUP PACEART INCLINIC DEVICE CHECK
Battery Remaining Longevity: 37.2 mo
Brady Statistic RV Percent Paced: 0.02 %
Date Time Interrogation Session: 20160727162353
HighPow Impedance: 44.415
Lead Channel Pacing Threshold Amplitude: 1.25 V
Lead Channel Setting Sensing Sensitivity: 0.3 mV
MDC IDC MSMT BATTERY VOLTAGE: 2.57 V
MDC IDC MSMT LEADCHNL RV IMPEDANCE VALUE: 325 Ohm
MDC IDC MSMT LEADCHNL RV PACING THRESHOLD AMPLITUDE: 1.25 V
MDC IDC MSMT LEADCHNL RV PACING THRESHOLD PULSEWIDTH: 0.7 ms
MDC IDC MSMT LEADCHNL RV PACING THRESHOLD PULSEWIDTH: 0.7 ms
MDC IDC MSMT LEADCHNL RV SENSING INTR AMPL: 8.8 mV
MDC IDC SET LEADCHNL RV PACING AMPLITUDE: 2.5 V
MDC IDC SET LEADCHNL RV PACING PULSEWIDTH: 0.7 ms
Pulse Gen Serial Number: 665247
Zone Setting Detection Interval: 300 ms

## 2015-01-19 NOTE — Progress Notes (Signed)
HPI: FU NICM; normal coronary arteries by cardiac catheterization in the 1990s, s/p AICD, prior DVT. Nuclear study 8/08: no ischemia, inf/lat/dAnt/apical scar. Abd ultrasound 11/15 showed no aneurysm. Echo 11/15 showed EF 20-25, moderate MR and severe LAE. Cath 2/16 showed normal cors, PCWP 19, moderate to severe pulmonary hypertension. Has had previous ICD and placed on amiodarone for VT. Referred to pulmonary recently for abnormal PFTs (restrictive defect and possible ILD) and dyspnea. Amiodarone decreased by Dr Rayann Heman. Since last seen, he does have dyspnea on exertion but no orthopnea, PND, pedal edema, chest pain or syncope.  Current Outpatient Prescriptions  Medication Sig Dispense Refill  . acetaminophen (TYLENOL) 325 MG tablet Take 2 tablets (650 mg total) by mouth every 4 (four) hours as needed for headache or mild pain.    Marland Kitchen amiodarone (PACERONE) 200 MG tablet Take 100 mg by mouth daily.  3  . aspirin 81 MG tablet Take 81 mg by mouth daily.      . carvedilol (COREG) 25 MG tablet Take 25 mg by mouth 2 (two) times daily with a meal.      . Cholecalciferol (VITAMIN D-3) 1000 UNITS CAPS Take 1,000 Units by mouth 2 (two) times daily.     Marland Kitchen COLCRYS 0.6 MG tablet Take 0.6 mg by mouth 2 (two) times daily as needed (for gout symptoms).     Marland Kitchen digoxin (LANOXIN) 0.125 MG tablet Take 125 mcg by mouth daily.      . Febuxostat 80 MG TABS Take 2 tablets by mouth in the morning and take 1 tablet by mouth in the evening    . folic acid (FOLVITE) 1 MG tablet Take 1 mg by mouth daily.    . furosemide (LASIX) 40 MG tablet Take 40 mg by mouth daily.     Marland Kitchen HYDROcodone-acetaminophen (NORCO) 10-325 MG per tablet Take 1 tablet by mouth every 8 (eight) hours as needed for moderate pain or severe pain.    . Linaclotide (LINZESS) 145 MCG CAPS capsule Take 145 mcg by mouth daily as needed (constipation).    Marland Kitchen lisinopril (PRINIVIL,ZESTRIL) 5 MG tablet Take 5 mg by mouth daily.     . Methotrexate Sodium, PF, 50  MG/2ML SOLN Inject 0.8 mLs as directed once a week. Take on sundays    . omeprazole (PRILOSEC) 40 MG capsule Take 1 capsule (40 mg total) by mouth daily. 30 capsule 11  . simvastatin (ZOCOR) 20 MG tablet Take 10 mg by mouth daily.     . TRADJENTA 5 MG TABS tablet TAKE 1 TABLET BY MOUTH DAILY FOR DIABETES 90 tablet 2   No current facility-administered medications for this visit.     Past Medical History  Diagnosis Date  . Obesity   . Prostate cancer   . Bursitis of elbow     left  . Osteoarthritis   . HTN (hypertension)   . HLD (hyperlipidemia)   . History of DVT (deep vein thrombosis)   . Chronic systolic heart failure     echo 4/13: EF 25-30%, mod MR, mod LAE, PASP 47  . NICM (nonischemic cardiomyopathy)     normal cors by cath in 1990s;  nuclear 8/08: no ischemia; inf/lat/dist ant/apical scar  . Moderate mitral regurgitation     echo 4/13  . Ventricular tachycardia 07/2014  . Diabetes mellitus without complication     Past Surgical History  Procedure Laterality Date  . Transurethral resection of prostate    . Cardiac defibrillator placement  10/09  STJ single chamber ICD   . Hernia repair    . Robot assisted laparoscopic radical prostatectomy  6/08  . Left and right heart catheterization with coronary angiogram N/A 08/13/2014    Procedure: LEFT AND RIGHT HEART CATHETERIZATION WITH CORONARY ANGIOGRAM;  Surgeon: Sinclair Grooms, MD;  Location: [facility] CATH LAB;  Service: Cardiovascular;  Laterality: N/A;    History   Social History  . Marital Status: Married    Spouse Name: N/A  . Number of Children: 3  . Years of Education: N/A   Occupational History  . Tour manager     Retired   Social History Main Topics  . Smoking status: Former Smoker -- 0.50 packs/day for 10 years    Types: Cigarettes    Quit date: 11/01/1975  . Smokeless tobacco: Never Used     Comment: quit over 30 years ago   . Alcohol Use: No     Comment: occasionally  . Drug Use: No  . Sexual  Activity: Not Currently   Other Topics Concern  . Not on file   Social History Narrative    ROS: no fevers or chills, productive cough, hemoptysis, dysphasia, odynophagia, melena, hematochezia, dysuria, hematuria, rash, seizure activity, orthopnea, PND, pedal edema, claudication. Remaining systems are negative.  Physical Exam: Well-developed well-nourished in no acute distress.  Skin is warm and dry.  HEENT is normal.  Neck is supple. Positive JVD Chest is clear to auscultation with normal expansion.  Cardiovascular exam is regular rate and rhythm. 3/6 systolic murmur apex Abdominal exam nontender or distended. No masses palpated. Extremities show no edema. neuro grossly intact  ECG 01/13/2015-sinus rhythm, left axis deviation, IVCD.

## 2015-01-22 ENCOUNTER — Ambulatory Visit (INDEPENDENT_AMBULATORY_CARE_PROVIDER_SITE_OTHER): Payer: Medicare Other | Admitting: Cardiology

## 2015-01-22 ENCOUNTER — Encounter: Payer: Self-pay | Admitting: *Deleted

## 2015-01-22 ENCOUNTER — Encounter: Payer: Self-pay | Admitting: Cardiology

## 2015-01-22 VITALS — BP 112/72 | HR 80 | Ht 69.0 in | Wt 195.4 lb

## 2015-01-22 DIAGNOSIS — I10 Essential (primary) hypertension: Secondary | ICD-10-CM

## 2015-01-22 DIAGNOSIS — I4729 Other ventricular tachycardia: Secondary | ICD-10-CM

## 2015-01-22 DIAGNOSIS — Z95810 Presence of automatic (implantable) cardiac defibrillator: Secondary | ICD-10-CM

## 2015-01-22 DIAGNOSIS — E785 Hyperlipidemia, unspecified: Secondary | ICD-10-CM | POA: Diagnosis not present

## 2015-01-22 DIAGNOSIS — I420 Dilated cardiomyopathy: Secondary | ICD-10-CM

## 2015-01-22 DIAGNOSIS — I5022 Chronic systolic (congestive) heart failure: Secondary | ICD-10-CM

## 2015-01-22 DIAGNOSIS — I059 Rheumatic mitral valve disease, unspecified: Secondary | ICD-10-CM | POA: Diagnosis not present

## 2015-01-22 DIAGNOSIS — I472 Ventricular tachycardia: Secondary | ICD-10-CM

## 2015-01-22 DIAGNOSIS — I340 Nonrheumatic mitral (valve) insufficiency: Secondary | ICD-10-CM

## 2015-01-22 NOTE — Assessment & Plan Note (Signed)
Continue amiodarone. Dose was recently decreased to 100 mg daily given abnormal pulmonary functions and dyspnea. He is scheduled to see pulmonary next week.

## 2015-01-22 NOTE — Assessment & Plan Note (Signed)
Blood pressure controlled. Continue present medications. 

## 2015-01-22 NOTE — Assessment & Plan Note (Signed)
Continue ARB and beta blocker. 

## 2015-01-22 NOTE — Assessment & Plan Note (Signed)
Patient appears to be euvolemic on examination. Continue present dose of Lasix. 

## 2015-01-22 NOTE — Assessment & Plan Note (Signed)
Management per electrophysiology. 

## 2015-01-22 NOTE — Assessment & Plan Note (Signed)
Continue statin. 

## 2015-01-22 NOTE — Assessment & Plan Note (Addendum)
Patient complains of increased dyspnea and there is note of 3/6 systolic murmur apex consistent with mitral regurgitation. Repeat echocardiogram to reassess. We will consider increasing diuretics in the future.

## 2015-01-22 NOTE — Patient Instructions (Signed)
Your physician recommends that you schedule a follow-up appointment in: 8-12 Rives physician has requested that you have an echocardiogram. Echocardiography is a painless test that uses sound waves to create images of your heart. It provides your doctor with information about the size and shape of your heart and how well your heart's chambers and valves are working. This procedure takes approximately one hour. There are no restrictions for this procedure.

## 2015-01-26 ENCOUNTER — Encounter: Payer: Self-pay | Admitting: *Deleted

## 2015-01-26 ENCOUNTER — Ambulatory Visit (INDEPENDENT_AMBULATORY_CARE_PROVIDER_SITE_OTHER): Payer: Medicare Other | Admitting: Pulmonary Disease

## 2015-01-26 ENCOUNTER — Telehealth: Payer: Self-pay | Admitting: Pulmonary Disease

## 2015-01-26 VITALS — BP 112/64 | HR 90 | Ht 69.0 in | Wt 199.0 lb

## 2015-01-26 DIAGNOSIS — J984 Other disorders of lung: Secondary | ICD-10-CM | POA: Diagnosis not present

## 2015-01-26 DIAGNOSIS — R0600 Dyspnea, unspecified: Secondary | ICD-10-CM | POA: Diagnosis not present

## 2015-01-26 NOTE — Telephone Encounter (Signed)
PFT 11/30/14: FVC 2.36 L (65%) FEV1 2.05 L (76%) FEV1/FVC 0.87 FEF 25-75 2.79 L (123%) no bronchodilator response TLC 3.97 L (88%) RV 57% ERV 185% DLCO uncorrected 64%  LABS 12/14/14 BMP: 142/6.9/111/26/29/1.51/85/9.0 LFT: 3.6/6.8/0.5/60/32/47 CBC: 5.6/12.6/38.5/175 ESR: 19  IMAGING CXR PA/LAT 09/19/14 (personally reviewed by me): Borderline cardiomegaly. No pleural effusion or thickening appreciated. Questionable basilar increased interstitial prominence. AICD noted with single lead projecting myocardium. No nodule or opacity appreciated.  LUE VENOUS DUPLEX (08/18/14): Acute deep venous thrombosis involving left axillary pain with superficial thrombosis left basilic & antecubital communicating veins.  CARDIAC TTE (04/23/14): LV mildly dilated with mild focal basal hypertrophy of the septum. EF 20-25% with diffuse hypokinesis. Left atrium severely dilated & right Atrium normal in size. Pulmonary artery normal in size & pulmonary artery systolic pressure 53 mmHg. RV normal in size and function. IVC normal in size. No aortic stenosis or regurgitation. Moderate mitral regurgitation without stenosis. Mild tricuspid regurgitation. No pericardial effusion.

## 2015-01-26 NOTE — Patient Instructions (Signed)
1. We're checking a CT scan of her chest to determine whether or not the amiodarone you are on is affecting your lungs. 2. We are going to check a walking test to determine if your oxygen level drops and could be contributing to your shortness of breath. 3. You will return to clinic in 2-4 weeks but please contact my office if you have any further questions.

## 2015-01-26 NOTE — Progress Notes (Signed)
Subjective:    Patient ID: [name], male    DOB: [birth year], 74 y.o.   MRN: [id number]  HPI The patient reports he had pneumonia in March 2016 and noticed his dyspnea after that episode. He does feel like his dyspnea is progressing. He reports he is able to go up and down the 14 steps to his second story with increased dyspnea but he doesn't have to stop to rest. He reports he is able to walk a block and occasionally has to stop to take a break due to his dyspnea. He reports a mild cough currently starting on 7/31. He reports it is nonproductive. Prior to this he reports he has a mild cough due to "throat itching" if he is in the direct path of air conditioning. He denies any wheezing. He denies any syncope. He has had some near syncope on exertion that is transient. He denies any chest pain or pressure. He denies any lower extremity edema. He uses 2-3 pillows every night at baseline without change. No new orthopnea. He reports starting on 7/31 he did wake up with dyspnea. He denies any sore throat or sinus congestion. No fever, chills, or sweats. No rashes or bruising. He reports pain in his fingers, ankles, & knees. He denies any joint stiffness, swelling, or erythema. He was started on Amiodarone in February 2016. He reports a mild intermittent headache as well as flushing in his face/head.  Review of Systems He does have dry eyes. No dry mouth or oral ulcers. No dysuria or hematuria. A pertinent 14 point review of systems is negative except as per the history of presenting illness.  Allergies  Allergen Reactions  . Spironolactone Other (See Comments)    Cannot remember reaction   Current Outpatient Prescriptions on File Prior to Visit  Medication Sig Dispense Refill  . acetaminophen (TYLENOL) 325 MG tablet Take 2 tablets (650 mg total) by mouth every 4 (four) hours as needed for headache or mild pain.    Marland Kitchen amiodarone (PACERONE) 200 MG tablet Take 100 mg by mouth daily.  3  . aspirin 81 MG  tablet Take 81 mg by mouth daily.      . carvedilol (COREG) 25 MG tablet Take 25 mg by mouth 2 (two) times daily with a meal.      . Cholecalciferol (VITAMIN D-3) 1000 UNITS CAPS Take 1,000 Units by mouth 2 (two) times daily.     Marland Kitchen COLCRYS 0.6 MG tablet Take 0.6 mg by mouth 2 (two) times daily as needed (for gout symptoms).     Marland Kitchen digoxin (LANOXIN) 0.125 MG tablet Take 125 mcg by mouth daily.      . Febuxostat 80 MG TABS Take 2 tablets by mouth in the morning and take 1 tablet by mouth in the evening    . folic acid (FOLVITE) 1 MG tablet Take 1 mg by mouth daily.    . furosemide (LASIX) 40 MG tablet Take 40 mg by mouth daily.     Marland Kitchen HYDROcodone-acetaminophen (NORCO) 10-325 MG per tablet Take 1 tablet by mouth every 8 (eight) hours as needed for moderate pain or severe pain.    . Linaclotide (LINZESS) 145 MCG CAPS capsule Take 145 mcg by mouth daily as needed (constipation).    Marland Kitchen lisinopril (PRINIVIL,ZESTRIL) 5 MG tablet Take 5 mg by mouth daily.     . Methotrexate Sodium, PF, 50 MG/2ML SOLN Inject 0.8 mLs as directed once a week. Take on sundays    .  omeprazole (PRILOSEC) 40 MG capsule Take 1 capsule (40 mg total) by mouth daily. 30 capsule 11  . simvastatin (ZOCOR) 20 MG tablet Take 10 mg by mouth daily.     . TRADJENTA 5 MG TABS tablet TAKE 1 TABLET BY MOUTH DAILY FOR DIABETES 90 tablet 2   No current facility-administered medications on file prior to visit.   Past Medical History  Diagnosis Date  . Obesity   . Prostate cancer     s/p prostatectomy  . Bursitis of elbow     left  . Osteoarthritis   . HTN (hypertension)   . HLD (hyperlipidemia)   . History of DVT (deep vein thrombosis) March 2016    Left Axillary DVT  . Chronic systolic heart failure     echo 4/13: EF 25-30%, mod MR, mod LAE, PASP 47  . NICM (nonischemic cardiomyopathy)     normal cors by cath in 1990s;  nuclear 8/08: no ischemia; inf/lat/dist ant/apical scar  . Moderate mitral regurgitation     echo 4/13  .  Ventricular tachycardia 07/2014  . Diabetes mellitus without complication   . Restrictive lung disease    Past Surgical History  Procedure Laterality Date  . Transurethral resection of prostate    . Cardiac defibrillator placement  10/09    STJ single chamber ICD   . Hernia repair    . Robot assisted laparoscopic radical prostatectomy  6/08  . Left and right heart catheterization with coronary angiogram N/A 08/13/2014    Procedure: LEFT AND RIGHT HEART CATHETERIZATION WITH CORONARY ANGIOGRAM;  Surgeon: Sinclair Grooms, MD;  Location: [facility] CATH LAB;  Service: Cardiovascular;  Laterality: N/A;   Family History  Problem Relation Age of Onset  . Prostate cancer Father   . Heart disease Mother   . Lung disease Neg Hx   . Rheumatologic disease Neg Hx   . Heart disease Son   . Renal Disease Son    History   Social History  . Marital Status: Married    Spouse Name: N/A  . Number of Children: 3  . Years of Education: N/A   Occupational History  . Tour manager     Retired   Social History Main Topics  . Smoking status: Former Smoker -- 0.50 packs/day for 10 years    Types: Cigarettes    Start date: 02/17/1966    Quit date: 10/18/1975  . Smokeless tobacco: Never Used  . Alcohol Use: No     Comment: Remote use over 5 years ago  . Drug Use: No  . Sexual Activity: Not Currently   Other Topics Concern  . None   Social History Narrative   Originally from [facility]. He has lived in [place]. He lived in [place] since 1989. 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 Spent a short period in [place]. He has also traveled to [facility] remotely. 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 Then he worked in [employer] until he retired. No pets currently. No bird, mold, or hot tub exposure. He enjoys working on his truck. No known asbestos exposure. No inhaled chemicals or fumes exposure.       Objective:   Physical Exam Blood pressure 112/64, pulse 90, height 5' 9"  (1.753 m), weight 199 lb  (90.266 kg), SpO2 97 %. General:  Awake. Alert. No acute distress. Mild central obesity.  Integument:  Warm & dry. No rash on exposed skin. No bruising. Lymphatics:  No appreciated cervical  or supraclavicular lymphadenoapthy. HEENT:  Moist mucus membranes. No oral ulcers. No scleral injection or icterus. PERRL. Cardiovascular:  Regular rate. No edema. JVD to the angle of the jaw.  Pulmonary:  Faint crackles bilateral lung bases. Symmetric chest wall expansion. No accessory muscle use. Abdomen: Soft. Normal bowel sounds. Protuberant. Grossly nontender. Musculoskeletal:  Normal bulk and tone. Hand grip strength 5/5 bilaterally. No joint deformity or effusion appreciated. Neurological:  CN 2-12 grossly in tact. No meningismus. Moving all 4 extremities equally. Symmetric patellar deep tendon reflexes. Psychiatric:  Mood and affect congruent. Speech normal rhythm, rate & tone.   PFT 11/30/14: FVC 2.36 L (65%) FEV1 2.05 L (76%) FEV1/FVC 0.87 FEF 25-75 2.79 L (123%) no bronchodilator response TLC 3.97 L (58%) RV 57% ERV 185% DLCO uncorrected 64%  LABS 12/14/14 BMP: 142/6.9/111/26/29/1.51/85/9.0 LFT: 3.6/6.8/0.5/60/32/47 CBC: 5.6/12.6/38.5/175 ESR: 19  IMAGING CXR PA/LAT 09/19/14 (personally reviewed by me): Borderline cardiomegaly. No pleural effusion or thickening appreciated. Questionable basilar increased interstitial prominence. AICD noted with single lead projecting myocardium. No nodule or opacity appreciated.  LUE VENOUS DUPLEX (08/18/14): Acute deep venous thrombosis involving left axillary pain with superficial thrombosis left basilic & antecubital communicating veins.  CARDIAC TTE (04/23/14): LV mildly dilated with mild focal basal hypertrophy of the septum. EF 20-25% with diffuse hypokinesis. Left atrium severely dilated & right Atrium normal in size. Pulmonary artery normal in size & pulmonary artery systolic pressure 53 mmHg. RV normal in size and function. IVC normal in size. No aortic  stenosis or regurgitation. Moderate mitral regurgitation without stenosis. Mild tricuspid regurgitation. No pericardial effusion.    Assessment & Plan:  Patient is a 74 year old male with known history of systolic congestive heart failure and arrhythmia on amiodarone since February 2016 per his report. He began to experience progressively worsening dyspnea since March of this year after an episode of "pneumonia". I reviewed his prior pulmonary function testing which does not suggest any airways obstruction but does show moderate restriction with a decreased carbon dioxide diffusion capacity. There is some increased interstitial prominence on prior chest x-ray imaging from April suggesting possible interstitial lung disease versus pulmonary edema. Certainly amiodarone lung toxicity must be considered given his constellation of symptoms. Alternatively, an autoimmune-induced interstitial lung disease is also a possibility. I'm going to further investigate this with chest CT imaging and to perform a 6 minute walk test to determine whether or not he is having hypoxia with exertion. As an alternative diagnosis his central obesity could be playing a role in his restrictive process as well as his cardiomyopathy. I instructed the patient contacted my office for any further questions or concerns.  1. Restrictive lung disease: Checking CT scan of the chest without contrast. Further serum testing depending upon the results. Repeat spirometry with DLCO at her before next appointment. 2. Dyspnea: Checking 6 minute walk test. 3. Follow-up: Patient to return to clinic in 2-4 weeks or sooner if needed.

## 2015-02-01 ENCOUNTER — Ambulatory Visit (INDEPENDENT_AMBULATORY_CARE_PROVIDER_SITE_OTHER)
Admission: RE | Admit: 2015-02-01 | Discharge: 2015-02-01 | Disposition: A | Discharge status: Home / Self Care | Payer: Medicare Other | Source: Ambulatory Visit | Attending: Pulmonary Disease | Admitting: Pulmonary Disease

## 2015-02-01 ENCOUNTER — Other Ambulatory Visit: Payer: Self-pay

## 2015-02-01 ENCOUNTER — Ambulatory Visit (HOSPITAL_COMMUNITY): Payer: Medicare Other | Attending: Cardiology

## 2015-02-01 DIAGNOSIS — J984 Other disorders of lung: Secondary | ICD-10-CM | POA: Diagnosis not present

## 2015-02-01 DIAGNOSIS — Z8249 Family history of ischemic heart disease and other diseases of the circulatory system: Secondary | ICD-10-CM | POA: Insufficient documentation

## 2015-02-01 DIAGNOSIS — I517 Cardiomegaly: Secondary | ICD-10-CM | POA: Insufficient documentation

## 2015-02-01 DIAGNOSIS — I10 Essential (primary) hypertension: Secondary | ICD-10-CM | POA: Insufficient documentation

## 2015-02-01 DIAGNOSIS — I429 Cardiomyopathy, unspecified: Secondary | ICD-10-CM | POA: Insufficient documentation

## 2015-02-01 DIAGNOSIS — I272 Other secondary pulmonary hypertension: Secondary | ICD-10-CM | POA: Insufficient documentation

## 2015-02-01 DIAGNOSIS — R0600 Dyspnea, unspecified: Secondary | ICD-10-CM | POA: Diagnosis not present

## 2015-02-01 DIAGNOSIS — R590 Localized enlarged lymph nodes: Secondary | ICD-10-CM | POA: Insufficient documentation

## 2015-02-01 DIAGNOSIS — E119 Type 2 diabetes mellitus without complications: Secondary | ICD-10-CM | POA: Insufficient documentation

## 2015-02-01 DIAGNOSIS — E785 Hyperlipidemia, unspecified: Secondary | ICD-10-CM | POA: Diagnosis not present

## 2015-02-01 DIAGNOSIS — I340 Nonrheumatic mitral (valve) insufficiency: Secondary | ICD-10-CM | POA: Diagnosis not present

## 2015-02-01 DIAGNOSIS — I059 Rheumatic mitral valve disease, unspecified: Secondary | ICD-10-CM | POA: Diagnosis not present

## 2015-02-01 DIAGNOSIS — Z87891 Personal history of nicotine dependence: Secondary | ICD-10-CM | POA: Insufficient documentation

## 2015-02-01 DIAGNOSIS — IMO0001 Reserved for inherently not codable concepts without codable children: Secondary | ICD-10-CM | POA: Insufficient documentation

## 2015-02-01 DIAGNOSIS — R911 Solitary pulmonary nodule: Secondary | ICD-10-CM | POA: Insufficient documentation

## 2015-02-19 ENCOUNTER — Ambulatory Visit (INDEPENDENT_AMBULATORY_CARE_PROVIDER_SITE_OTHER): Payer: Medicare Other | Admitting: Pulmonary Disease

## 2015-02-19 DIAGNOSIS — R0600 Dyspnea, unspecified: Secondary | ICD-10-CM

## 2015-02-19 DIAGNOSIS — R0609 Other forms of dyspnea: Secondary | ICD-10-CM

## 2015-02-19 DIAGNOSIS — J984 Other disorders of lung: Secondary | ICD-10-CM

## 2015-02-19 LAB — PULMONARY FUNCTION TEST
DL/VA % pred: 109 %
DL/VA: 4.98 ml/min/mmHg/L
DLCO UNC % PRED: 52 %
DLCO unc: 16.39 ml/min/mmHg
FEF 25-75 Post: 2.06 L/sec
FEF 25-75 Pre: 2.86 L/sec
FEF2575-%CHANGE-POST: -28 %
FEF2575-%Pred-Post: 91 %
FEF2575-%Pred-Pre: 127 %
FEV1-%CHANGE-POST: -5 %
FEV1-%PRED-POST: 62 %
FEV1-%Pred-Pre: 66 %
FEV1-POST: 1.7 L
FEV1-Pre: 1.79 L
FEV1FVC-%Change-Post: -1 %
FEV1FVC-%Pred-Pre: 117 %
FEV6-%Change-Post: -3 %
FEV6-%PRED-POST: 55 %
FEV6-%PRED-PRE: 58 %
FEV6-POST: 1.92 L
FEV6-Pre: 2 L
FEV6FVC-%PRED-POST: 105 %
FEV6FVC-%Pred-Pre: 105 %
FVC-%Change-Post: -3 %
FVC-%Pred-Post: 53 %
FVC-%Pred-Pre: 55 %
FVC-Post: 1.92 L
FVC-Pre: 2 L
POST FEV1/FVC RATIO: 88 %
PRE FEV6/FVC RATIO: 100 %
Post FEV6/FVC ratio: 100 %
Pre FEV1/FVC ratio: 89 %
RV % pred: 53 %
RV: 1.33 L
TLC % pred: 50 %
TLC: 3.48 L

## 2015-02-19 NOTE — Progress Notes (Signed)
PFT done today. 

## 2015-02-20 NOTE — Progress Notes (Signed)
PFT 02/19/15:  FVC 2.00 L (55%) FEV1 1.79 L (66%) FEV1/FVC 0.89 FEF 25-75 2.86 L (127%) no bronchodilator response TLC 3.48 L (50%) RV 53% ERV 144% DLCO uncorrected 52%  6MWT 02/19/15:  Walked 240 meters / Baseline Sat 98% on RA / Nadir Sat 98% on RA @ rest (stpped w/ 2:32 left due to dyspnea)

## 2015-02-23 ENCOUNTER — Encounter: Payer: Self-pay | Admitting: Pulmonary Disease

## 2015-02-23 ENCOUNTER — Ambulatory Visit (INDEPENDENT_AMBULATORY_CARE_PROVIDER_SITE_OTHER): Payer: Medicare Other | Admitting: Pulmonary Disease

## 2015-02-23 VITALS — BP 124/68 | HR 74 | Ht 69.0 in | Wt 194.0 lb

## 2015-02-23 DIAGNOSIS — J984 Other disorders of lung: Secondary | ICD-10-CM

## 2015-02-23 DIAGNOSIS — R911 Solitary pulmonary nodule: Secondary | ICD-10-CM | POA: Diagnosis not present

## 2015-02-23 DIAGNOSIS — R590 Localized enlarged lymph nodes: Secondary | ICD-10-CM

## 2015-02-23 DIAGNOSIS — R0600 Dyspnea, unspecified: Secondary | ICD-10-CM | POA: Diagnosis not present

## 2015-02-23 DIAGNOSIS — I82402 Acute embolism and thrombosis of unspecified deep veins of left lower extremity: Secondary | ICD-10-CM | POA: Diagnosis not present

## 2015-02-23 DIAGNOSIS — R599 Enlarged lymph nodes, unspecified: Secondary | ICD-10-CM | POA: Diagnosis not present

## 2015-02-23 DIAGNOSIS — IMO0001 Reserved for inherently not codable concepts without codable children: Secondary | ICD-10-CM

## 2015-02-23 NOTE — Addendum Note (Signed)
Addended by: Oscar La R on: 02/23/2015 03:23 PM   Modules accepted: Orders

## 2015-02-23 NOTE — Addendum Note (Signed)
Addended by: Oscar La R on: 02/23/2015 04:41 PM   Modules accepted: Orders

## 2015-02-23 NOTE — Progress Notes (Signed)
Subjective:    Patient ID: [name], male    DOB: 1940/07/19, 74 y.o.   MRN: [id number]  C.C.:  Follow-up for Dyspnea, Restrictive Lung Disease, Lung Nodule, & DVT.  HPI  Dyspnea:  Reports he developed a nonproductive cough since Friday. He reports he has had mild chills yesterday but no fever or sweats. Prior to that he felt like his dyspnea was improving. He reports that his chest does feel somewhat tight today. He reports he has been walking & exercising more regularly walking 1/4 mile regularly. He reports he had significant dyspnea with ambulation that terminated his walk test early.   Restrictive Lung Disease:  Patient currently on amiodarone without signs of lung toxicity. Likely secondary to cardiomyopathy. No rashes or bruising. No suggestion of ILD on CT scan. His spirometry has worsened significantly since last testing.  Lung Nodule:  See along left major fissure measuring 75m on CT scan from August 2016.  DVT:  Found to have left axillary DVT and was treated with Xarelto but had to be discontinued due to hemoptysis in March. Reports he had another DVT in 2009 & was treated for Coumadin for a period.  Review of Systems He denies any sore throat or sinus congestion. No nausea, emesis, or diarrhea.  Allergies  Allergen Reactions  . Spironolactone Other (See Comments)    Cannot remember reaction   Current Outpatient Prescriptions on File Prior to Visit  Medication Sig Dispense Refill  . acetaminophen (TYLENOL) 325 MG tablet Take 2 tablets (650 mg total) by mouth every 4 (four) hours as needed for headache or mild pain.    .Marland Kitchenamiodarone (PACERONE) 200 MG tablet Take 100 mg by mouth daily.  3  . aspirin 81 MG tablet Take 81 mg by mouth daily.      . carvedilol (COREG) 25 MG tablet Take 25 mg by mouth 2 (two) times daily with a meal.      . Cholecalciferol (VITAMIN D-3) 1000 UNITS CAPS Take 1,000 Units by mouth 2 (two) times daily.     .Marland KitchenCOLCRYS 0.6 MG tablet Take 0.6 mg by  mouth 2 (two) times daily as needed (for gout symptoms).     .Marland Kitchendigoxin (LANOXIN) 0.125 MG tablet Take 125 mcg by mouth daily.      . Febuxostat 80 MG TABS Take 2 tablets by mouth in the morning and take 1 tablet by mouth in the evening    . folic acid (FOLVITE) 1 MG tablet Take 1 mg by mouth daily.    . furosemide (LASIX) 40 MG tablet Take 40 mg by mouth daily.     .Marland KitchenHYDROcodone-acetaminophen (NORCO) 10-325 MG per tablet Take 1 tablet by mouth every 8 (eight) hours as needed for moderate pain or severe pain.    . Linaclotide (LINZESS) 145 MCG CAPS capsule Take 145 mcg by mouth daily as needed (constipation).    .Marland Kitchenlisinopril (PRINIVIL,ZESTRIL) 5 MG tablet Take 5 mg by mouth daily.     . Methotrexate Sodium, PF, 50 MG/2ML SOLN Inject 0.8 mLs as directed once a week. Take on sundays    . omeprazole (PRILOSEC) 40 MG capsule Take 1 capsule (40 mg total) by mouth daily. 30 capsule 11  . simvastatin (ZOCOR) 20 MG tablet Take 10 mg by mouth daily.     . TRADJENTA 5 MG TABS tablet TAKE 1 TABLET BY MOUTH DAILY FOR DIABETES 90 tablet 2   No current facility-administered medications on file prior to visit.  Past Medical History  Diagnosis Date  . Obesity   . Prostate cancer     s/p prostatectomy  . Bursitis of elbow     left  . Osteoarthritis   . HTN (hypertension)   . HLD (hyperlipidemia)   . History of DVT (deep vein thrombosis) March 2016    Left Axillary DVT  . Chronic systolic heart failure     echo 4/13: EF 25-30%, mod MR, mod LAE, PASP 47  . NICM (nonischemic cardiomyopathy)     normal cors by cath in 1990s;  nuclear 8/08: no ischemia; inf/lat/dist ant/apical scar  . Moderate mitral regurgitation     echo 4/13  . Ventricular tachycardia 07/2014  . Diabetes mellitus without complication   . Restrictive lung disease    Past Surgical History  Procedure Laterality Date  . Transurethral resection of prostate    . Cardiac defibrillator placement  10/09    STJ single chamber ICD   .  Hernia repair    . Robot assisted laparoscopic radical prostatectomy  6/08  . Left and right heart catheterization with coronary angiogram N/A 08/13/2014    Procedure: LEFT AND RIGHT HEART CATHETERIZATION WITH CORONARY ANGIOGRAM;  Surgeon: Sinclair Grooms, MD;  Location: [facility] CATH LAB;  Service: Cardiovascular;  Laterality: N/A;   Family History  Problem Relation Age of Onset  . Prostate cancer Father   . Heart disease Mother   . Lung disease Neg Hx   . Rheumatologic disease Neg Hx   . Heart disease Son   . Renal Disease Son    Social History   Social History  . Marital Status: Married    Spouse Name: N/A  . Number of Children: 3  . Years of Education: N/A   Occupational History  . Tour manager     Retired   Social History Main Topics  . Smoking status: Former Smoker -- 0.50 packs/day for 10 years    Types: Cigarettes    Start date: 02/17/1966    Quit date: 10/18/1975  . Smokeless tobacco: Never Used  . Alcohol Use: No     Comment: Remote use over 5 years ago  . Drug Use: No  . Sexual Activity: Not Currently   Other Topics Concern  . None   Social History Narrative   Originally from [facility]. He has lived in [place]. He lived in [place] since 1989. 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 Spent a short period in [place]. He has also traveled to [place] [facility] [place] [facility] remotely. 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 Then he worked in [employer] until he retired. No pets currently. No bird, mold, or hot tub exposure. He enjoys working on his truck. No known asbestos exposure. No inhaled chemicals or fumes exposure.       Objective:   Physical Exam Blood pressure 124/68, pulse 74, height 5' 9"  (1.753 m), weight 194 lb (87.998 kg), SpO2 100 %. General:  Awake. Alert. African-American male.  Integument:  Warm & dry. No rash on exposed skin. No bruising. Lymphatics:  No appreciated cervical or supraclavicular lymphadenoapthy. HEENT:  Moist mucus membranes.PERRL. no oral  ulcers. Cardiovascular:  Regular rate. No edema. Unable to appreciate JVD.  Pulmonary: Good aeration & clear to auscultation bilaterally. Symmetric chest wall expansion. No accessory muscle use. Abdomen: Soft. Normal bowel sounds. Protuberant. . Musculoskeletal:  Normal bulk and tone. Hand grip strength 5/5 bilaterally. No joint deformity or effusion appreciated.   PFT 02/19/15: FVC  2.00 L (85%) FEV1 1.79 L (66%) FEV1/FVC 0.89 FEF 25-75 2.86 L (127%) no bronchodilator response TLC 3.48 L (50%) RV 53% ERV 144% DLCO uncorrected 52% 11/30/14: FVC 2.36 L (65%) FEV1 2.05 L (76%) FEV1/FVC 0.87 FEF 25-75 2.79 L (123%) no bronchodilator response TLC 3.97 L (58%) RV 57% ERV 185% DLCO uncorrected 64%  6MWT 02/19/15:  Walked 240 meters / Baseline Sat 98% on RA / nadir Sat 98% on RA @ rest (stopped w/ 2:32 left because he didn't want to continue w/ dyspnea)  LABS 12/14/14 BMP: 142/6.9/111/26/29/1.51/85/9.0 LFT: 3.6/6.8/0.5/60/32/47 CBC: 5.6/12.6/38.5/175 ESR: 19  IMAGING CT CHEST W/O 02/01/15 (personally reviewed by me): Tiny bilateral pleural effusions. No pleural thickening. Enlarged mediastinal lymph nodes measuring up to 1.3 cm in short axis precarinal. No parenchymal mass appreciated. No nodule or opacification appreciated other than a 4 mm nodule in the left lung adjacent to the major fissure. Borderline bronchiectasis. No pericardial effusion.  CXR PA/LAT 09/19/14 (previously reviewed by me): Borderline cardiomegaly. No pleural effusion or thickening appreciated. Questionable basilar increased interstitial prominence. AICD noted with single lead projecting myocardium. No nodule or opacity appreciated.  LUE VENOUS DUPLEX (08/18/14): Acute deep venous thrombosis involving left axillary pain with superficial thrombosis left basilic & antecubital communicating veins.  CARDIAC TTE (04/23/14): LV mildly dilated with mild focal basal hypertrophy of the septum. EF 20-25% with diffuse hypokinesis. Left atrium  severely dilated & right Atrium normal in size. Pulmonary artery normal in size & pulmonary artery systolic pressure 53 mmHg. RV normal in size and function. IVC normal in size. No aortic stenosis or regurgitation. Moderate mitral regurgitation without stenosis. Mild tricuspid regurgitation. No pericardial effusion.    Assessment & Plan:  74 year old male with restrictive lung disease likely due to his underlying cardiomyopathy. Patient continues to have dyspnea on exertion but reports his functional capabilities had improved prior to his recent illness and he was walking regularly. I believe he would benefit from a structured pulmonary rehabilitation. I personally reviewed his chest CT scan with him today which shows a left-sided lung nodule as well as mildly enlarged mediastinal lymph nodes which could be secondary to his underlying systolic congestive heart failure. We also addressed his concern over his DVT and inability to take systemic and coagulation. Given this is his second DVT I am going to evaluate him for chronic thromboembolic disease with a VQ scan. I did warn him that we may have to refer him to hematology for further evaluation depending upon this result. I instructed the patient to contact my office if he had any new breathing problems before his next appointment.  1. Dyspnea: Likely multifactorial from some element of deconditioning as well as from his cardiomyopathy. Repeat spirometry with DLCO & 6 minute walk test at next appointment. 2. Restrictive lung disease: Likely secondary to cardiomyopathy. Referring patient to pulmonary rehabilitation. Repeat spirometry with DLCO & 6 minute walk test at next appointment. 3. Mediastinal adenopathy: Plan for repeat CT scan of the chest without contrast in February 2017. 4. Left lung nodule: Plan for repeat CT scan of the chest without contrast February 2017. 5. DVT: Currently not on systemic anticoagulation due to prior hemoptysis. Checking VQ  scan for chronic thromboembolic disease given suggestion of pulmonary hypertension on transthoracic echocardiogram.  6. Follow-up: Patient to return to clinic in 6 months or sooner if needed.

## 2015-02-23 NOTE — Patient Instructions (Signed)
1. We will repeat breathing tests at your next appointment. 2. I'm checking a special chest X-ray to see if you have clots in your lungs. You may need to see a hematologist. 3. We will repeat your chest CT scan in February to followup on your left lung nodule and enlarged lymph nodes. 4. I am referring you to pulmonary rehabilitation to help you develop some endurance. 5. I will see you back in 6 months but please call me if you have any questions or concerns.

## 2015-03-05 ENCOUNTER — Telehealth (HOSPITAL_COMMUNITY): Payer: Self-pay

## 2015-03-05 NOTE — Telephone Encounter (Signed)
I have called and left a message with Gareth to inquire about participation in Pulmonary Rehab per Dr. Ammie Dalton referral. Will send letter in mail and follow up.

## 2015-03-09 ENCOUNTER — Ambulatory Visit (HOSPITAL_COMMUNITY)
Admission: RE | Admit: 2015-03-09 | Discharge: 2015-03-09 | Disposition: A | Discharge status: Home / Self Care | Payer: Medicare Other | Source: Ambulatory Visit | Attending: Pulmonary Disease | Admitting: Pulmonary Disease

## 2015-03-09 ENCOUNTER — Encounter (HOSPITAL_COMMUNITY)
Admission: RE | Admit: 2015-03-09 | Discharge: 2015-03-09 | Disposition: A | Discharge status: Home / Self Care | Payer: Medicare Other | Source: Ambulatory Visit | Attending: Pulmonary Disease | Admitting: Pulmonary Disease

## 2015-03-09 DIAGNOSIS — R599 Enlarged lymph nodes, unspecified: Secondary | ICD-10-CM | POA: Diagnosis not present

## 2015-03-09 DIAGNOSIS — R911 Solitary pulmonary nodule: Secondary | ICD-10-CM

## 2015-03-09 DIAGNOSIS — I517 Cardiomegaly: Secondary | ICD-10-CM | POA: Insufficient documentation

## 2015-03-09 DIAGNOSIS — R0602 Shortness of breath: Secondary | ICD-10-CM | POA: Diagnosis present

## 2015-03-09 DIAGNOSIS — R0600 Dyspnea, unspecified: Secondary | ICD-10-CM

## 2015-03-09 DIAGNOSIS — I82402 Acute embolism and thrombosis of unspecified deep veins of left lower extremity: Secondary | ICD-10-CM

## 2015-03-09 DIAGNOSIS — IMO0001 Reserved for inherently not codable concepts without codable children: Secondary | ICD-10-CM

## 2015-03-09 DIAGNOSIS — R5383 Other fatigue: Secondary | ICD-10-CM | POA: Diagnosis not present

## 2015-03-09 DIAGNOSIS — R590 Localized enlarged lymph nodes: Secondary | ICD-10-CM

## 2015-03-09 DIAGNOSIS — Z86718 Personal history of other venous thrombosis and embolism: Secondary | ICD-10-CM | POA: Diagnosis not present

## 2015-03-09 MED ORDER — TECHNETIUM TC 99M DIETHYLENETRIAME-PENTAACETIC ACID
41.0000 | Freq: Once | INTRAVENOUS | Status: DC | PRN
  Administered 2015-03-09: 41 via INTRAVENOUS
  Filled 2015-03-09: qty 41

## 2015-03-09 MED ORDER — TECHNETIUM TO 99M ALBUMIN AGGREGATED
5.5000 | Freq: Once | INTRAVENOUS | Status: AC | PRN
  Administered 2015-03-09: 5.5 via INTRAVENOUS

## 2015-03-10 ENCOUNTER — Telehealth: Payer: Self-pay | Admitting: Pulmonary Disease

## 2015-03-10 NOTE — Telephone Encounter (Signed)
Result Note     Please call the patient and let him know I reviewed his CXR as well as his V/Q scan report. There is no evidence of any clots in his lungs.    Thanks    I spoke with patient about results and he verbalized understanding and had no questions

## 2015-03-15 ENCOUNTER — Encounter (HOSPITAL_COMMUNITY): Payer: Self-pay

## 2015-03-15 ENCOUNTER — Encounter (HOSPITAL_COMMUNITY)
Admission: RE | Admit: 2015-03-15 | Discharge: 2015-03-15 | Disposition: A | Discharge status: Home / Self Care | Payer: Medicare Other | Source: Ambulatory Visit | Attending: Internal Medicine | Admitting: Internal Medicine

## 2015-03-15 VITALS — BP 113/64 | HR 70

## 2015-03-15 DIAGNOSIS — J984 Other disorders of lung: Secondary | ICD-10-CM | POA: Diagnosis not present

## 2015-03-15 DIAGNOSIS — I429 Cardiomyopathy, unspecified: Secondary | ICD-10-CM | POA: Diagnosis not present

## 2015-03-15 NOTE — Progress Notes (Signed)
[name] 74 y.o. male Pulmonary Rehab Orientation Note Patient arrived today in Cardiac and Pulmonary Rehab for orientation to Pulmonary Rehab. He was transported from [employer] via wheel chair. He does not carry portable oxygen. Per pt, he uses oxygen never. Color good, skin warm and dry. Patient is oriented to time and place. Patient's medical history and medications reviewed. Heart rate is normal, breath sounds clear to auscultation, no wheezes, rales, or rhonchi. Grip strength equal and strong with his strength on his right hand is 42.  Distal pulses  are 2+ bilaterally without peripheral edema.  He states his feet and ankles do swell on occasion and he takes Lasix at that time.  He also weighs himself daily and is aware of weight gain guidelines for CHF.  Patient reports he does take medications as prescribed. Patient states he follows a Low Sodium diet.  He rarely eats out and his wife pays close attention to his need for a low sodium and low fat diet.   The patient reports no specific efforts to gain or lose weight.. Patient's weight will be monitored closely. Demonstration and practice of PLB using pulse oximeter. Patient able to return demonstration satisfactorily. Safety and hand hygiene in the exercise area reviewed with patient.  Also, we discussed warning signs and to let the staff know if any occur.  Patient voices understanding of the information reviewed. Department expectations discussed with patient and achievable goals were set. The patient shows enthusiasm about attending the program and we look forward to working with this nice gentleman. The patient is scheduled for a 6 min walk test on Tuesday, March 16, 2015 @ 3:30pm and to begin exercise on Thursday, March 18, 2015 in the 10:30 am class..  [id number]

## 2015-03-16 ENCOUNTER — Encounter (HOSPITAL_COMMUNITY)
Admission: RE | Admit: 2015-03-16 | Discharge: 2015-03-16 | Disposition: A | Discharge status: Home / Self Care | Payer: Medicare Other | Source: Ambulatory Visit | Attending: Internal Medicine | Admitting: Internal Medicine

## 2015-03-16 DIAGNOSIS — J984 Other disorders of lung: Secondary | ICD-10-CM | POA: Diagnosis not present

## 2015-03-16 NOTE — Progress Notes (Signed)
[name] completed a Six-Minute Walk Test on 03/16/15 . [name] walked 928 feet with 1 break lasting 20 seconds.  The patient's lowest oxygen saturation was 94 %, highest heart rate was 89 bpm , and highest blood pressure was 122/68. The patient was on room air. Patient stated that nothing hindered their walk test.

## 2015-03-18 ENCOUNTER — Encounter (HOSPITAL_COMMUNITY)
Admission: RE | Admit: 2015-03-18 | Discharge: 2015-03-18 | Disposition: A | Discharge status: Home / Self Care | Payer: Medicare Other | Source: Ambulatory Visit | Attending: Internal Medicine | Admitting: Internal Medicine

## 2015-03-18 DIAGNOSIS — J984 Other disorders of lung: Secondary | ICD-10-CM | POA: Diagnosis not present

## 2015-03-18 NOTE — Progress Notes (Signed)
Today, [place] exercised at [facility]. [facility]. Service time was from 1030 to 1240.  The patient exercised by performing aerobic, strengthening, and stretching exercises. Oxygen saturation, heart rate, blood pressure, rate of perceived exertion, and shortness of breath were all monitored before, during, and after exercise. [name] presented with no problems at today's exercise session. He attended Anatomy and Physiology class today.  The patient did  have an increase in workload intensity during today's exercise session.  Pre-exercise vitals: . Weight kg: 97.5 . Liters of O2: RA . SpO2: 99 . HR: 70 . BP: 102/64 . CBG: 209  Exercise vitals: . Highest heartrate:  86 . Lowest oxygen saturation: 97 . Highest blood pressure: 108/70 . Liters of 02: RA  Post-exercise vitals: . SpO2: 88 . HR: 68 . BP: 112/64 . Liters of O2: RA . CBG: 116 Dr. Brand Males, Medical Director Dr. Carles Collet is immediately available during today's Pulmonary Rehab session for [place] on 03/18/2015  at 1030 class time.  Marland Kitchen

## 2015-03-23 ENCOUNTER — Encounter (HOSPITAL_COMMUNITY)
Admission: RE | Admit: 2015-03-23 | Discharge: 2015-03-23 | Disposition: A | Discharge status: Home / Self Care | Payer: Medicare Other | Source: Ambulatory Visit | Attending: Internal Medicine | Admitting: Internal Medicine

## 2015-03-23 DIAGNOSIS — I429 Cardiomyopathy, unspecified: Secondary | ICD-10-CM | POA: Diagnosis not present

## 2015-03-23 DIAGNOSIS — J984 Other disorders of lung: Secondary | ICD-10-CM | POA: Insufficient documentation

## 2015-03-23 NOTE — Progress Notes (Signed)
Today, [place] exercised at [facility]. [facility]. Service time was from 10:30am to 12:20pm.  The patient exercised by performing aerobic, strengthening, and stretching exercises. Oxygen saturation, heart rate, blood pressure, rate of perceived exertion, and shortness of breath were all monitored before, during, and after exercise. [name] presented with no problems at today's exercise session.  The patient did have an increase in workload intensity during today's exercise session.  Pre-exercise vitals: . Weight kg: 87.2 . Liters of O2: ra . SpO2: 100 . HR: 69 . BP: 100/56 . CBG: 165  Exercise vitals: . Highest heartrate:  82 . Lowest oxygen saturation: 97 . Highest blood pressure: 120/60 . Liters of 02: ra  Post-exercise vitals: . SpO2: 97 . HR: 63 . BP: 96/60 . Liters of O2: ra . CBG: 146  Dr. Brand Males, Medical Director Dr. Allyson Sabal is immediately available during today's Pulmonary Rehab session for [place] on 03/22/15 at 10:30am class time

## 2015-03-25 ENCOUNTER — Encounter (HOSPITAL_COMMUNITY)
Admission: RE | Admit: 2015-03-25 | Discharge: 2015-03-25 | Disposition: A | Discharge status: Home / Self Care | Payer: Medicare Other | Source: Ambulatory Visit | Attending: Internal Medicine | Admitting: Internal Medicine

## 2015-03-25 DIAGNOSIS — J984 Other disorders of lung: Secondary | ICD-10-CM | POA: Diagnosis not present

## 2015-03-25 NOTE — Progress Notes (Signed)
Today, [name] exercised at [facility]. [facility]. Service time was from 10:30am to 12:30pm.  The patient exercised by performing aerobic, strengthening, and stretching exercises. Oxygen saturation, heart rate, blood pressure, rate of perceived exertion, and shortness of breath were all monitored before, during, and after exercise. [name] presented with no problems at today's exercise session.  The patient attended education today with Derek Mound on Nutrition for the Pulmonary Patient.  The patient did have an increase in workload intensity during today's exercise session.  Pre-exercise vitals: . Weight kg: 87.8 . Liters of O2: ra . SpO2: 100 . HR: 66 . BP: 100/40 . CBG: 199  Exercise vitals: . Highest heartrate:  78 . Lowest oxygen saturation: 97 . Highest blood pressure: 112/70 . Liters of 02: ra  Post-exercise vitals: . SpO2: 100 . HR: 66 . BP: 108/60 . Liters of O2: ra . CBG: 137  Dr. Brand Males, Medical Director Dr. Allyson Sabal is immediately available during today's Pulmonary Rehab session for [name] on 03/25/15 at 10:30am class time

## 2015-03-26 NOTE — Progress Notes (Signed)
HPI: FU NICM, s/p AICD, prior DVT. Abd ultrasound 11/15 showed no aneurysm. Cath 2/16 showed normal cors, PCWP 19, moderate to severe pulmonary hypertension. Has had previous ICD and placed on amiodarone for VT. Amiodarone decreased by Dr Rayann Heman because of abnormal PFTs. Chest CT August 2016 showed resolution of right lower lobe and lingula disease. 4 mm nodule noted in the left major fissure. VQ scan September 2016 showed no pulmonary embolus. Seen by pulmonary and felt not related to amiodarone toxicity. Dyspnea felt secondary to deconditioning and restrictive lung disease secondary to cardiomyopathy. Last echocardiogram August 2016 showed ejection fraction 25%, moderate mitral regurgitation, biatrial enlargement and moderately elevated pulmonary pressures. Since last seen, He has some dyspnea on exertion but no orthopnea, PND, pedal edema, or syncope. Occasional sharp pain in chest for 1-2 seconds but no exertional symptoms.  Current Outpatient Prescriptions  Medication Sig Dispense Refill  . acetaminophen (TYLENOL) 325 MG tablet Take 2 tablets (650 mg total) by mouth every 4 (four) hours as needed for headache or mild pain.    Marland Kitchen amiodarone (PACERONE) 200 MG tablet Take 100 mg by mouth daily.  3  . aspirin 81 MG tablet Take 81 mg by mouth daily.      . carvedilol (COREG) 25 MG tablet Take 25 mg by mouth 2 (two) times daily with a meal.      . Cholecalciferol (VITAMIN D-3) 1000 UNITS CAPS Take 1,000 Units by mouth 2 (two) times daily.     Marland Kitchen COLCRYS 0.6 MG tablet Take 0.6 mg by mouth 2 (two) times daily as needed (for gout symptoms).     Marland Kitchen digoxin (LANOXIN) 0.125 MG tablet Take 125 mcg by mouth daily.      . Febuxostat 80 MG TABS Take 2 tablets by mouth in the morning and take 1 tablet by mouth in the evening    . folic acid (FOLVITE) 1 MG tablet Take 1 mg by mouth daily.    . furosemide (LASIX) 40 MG tablet Take 40 mg by mouth daily.     Marland Kitchen HYDROcodone-acetaminophen (NORCO) 10-325 MG per  tablet Take 1 tablet by mouth every 8 (eight) hours as needed for moderate pain or severe pain.    . Linaclotide (LINZESS) 145 MCG CAPS capsule Take 145 mcg by mouth daily as needed (constipation).    Marland Kitchen lisinopril (PRINIVIL,ZESTRIL) 5 MG tablet Take 5 mg by mouth daily.     . Methotrexate Sodium, PF, 50 MG/2ML SOLN Inject 0.8 mLs as directed once a week. Take on sundays    . omeprazole (PRILOSEC) 40 MG capsule Take 1 capsule (40 mg total) by mouth daily. 30 capsule 11  . simvastatin (ZOCOR) 20 MG tablet Take 10 mg by mouth daily.     . TRADJENTA 5 MG TABS tablet TAKE 1 TABLET BY MOUTH DAILY FOR DIABETES 90 tablet 2   No current facility-administered medications for this visit.     Past Medical History  Diagnosis Date  . Obesity   . Prostate cancer [facility])     s/p prostatectomy  . Bursitis of elbow     left  . Osteoarthritis   . HTN (hypertension)   . HLD (hyperlipidemia)   . History of DVT (deep vein thrombosis) March 2016    Left Axillary DVT  . Chronic systolic heart failure (HCC)     echo 4/13: EF 25-30%, mod MR, mod LAE, PASP 47  . NICM (nonischemic cardiomyopathy) ([place])     normal cors by  cath in 1990s;  nuclear 8/08: no ischemia; inf/lat/dist ant/apical scar  . Moderate mitral regurgitation     echo 4/13  . Ventricular tachycardia ([place]) 07/2014  . Diabetes mellitus without complication ([place])   . Restrictive lung disease     Past Surgical History  Procedure Laterality Date  . Transurethral resection of prostate    . Cardiac defibrillator placement  10/09    STJ single chamber ICD   . Hernia repair    . Robot assisted laparoscopic radical prostatectomy  6/08  . Left and right heart catheterization with coronary angiogram N/A 08/13/2014    Procedure: LEFT AND RIGHT HEART CATHETERIZATION WITH CORONARY ANGIOGRAM;  Surgeon: Sinclair Grooms, MD;  Location: [facility] CATH LAB;  Service: Cardiovascular;  Laterality: N/A;    Social History   Social History  . Marital Status:  Married    Spouse Name: N/A  . Number of Children: 3  . Years of Education: N/A   Occupational History  . Tour manager     Retired   Social History Main Topics  . Smoking status: Former Smoker -- 0.50 packs/day for 10 years    Types: Cigarettes    Start date: 02/17/1966    Quit date: 10/18/1975  . Smokeless tobacco: Former Systems developer    Quit date: 11/12/1975  . Alcohol Use: No     Comment: Remote use over 5 years ago  . Drug Use: No  . Sexual Activity: Not Currently   Other Topics Concern  . Not on file   Social History Narrative   Originally from [facility]. He has lived in [place]. He lived in [place] since 1989. 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 Spent a short period in [place]. He has also traveled to [facility] remotely. 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 Then he worked in [employer] until he retired. No pets currently. No bird, mold, or hot tub exposure. He enjoys working on his truck. No known asbestos exposure. No inhaled chemicals or fumes exposure.     ROS: no fevers or chills, productive cough, hemoptysis, dysphasia, odynophagia, melena, hematochezia, dysuria, hematuria, rash, seizure activity, orthopnea, PND, pedal edema, claudication. Remaining systems are negative.  Physical Exam: Well-developed well-nourished in no acute distress.  Skin is warm and dry.  HEENT is normal.  Neck is supple.  Chest is clear to auscultation with normal expansion.  Cardiovascular exam is regular rate and rhythm.  Abdominal exam nontender or distended. No masses palpated. Extremities show no edema. neuro grossly intact

## 2015-03-29 ENCOUNTER — Encounter: Payer: Self-pay | Admitting: Internal Medicine

## 2015-03-29 LAB — PSA: PSA, FREE: 0.01

## 2015-03-30 ENCOUNTER — Encounter (HOSPITAL_COMMUNITY): Payer: Medicare Other

## 2015-03-30 ENCOUNTER — Encounter: Payer: Self-pay | Admitting: Cardiology

## 2015-03-30 ENCOUNTER — Ambulatory Visit (INDEPENDENT_AMBULATORY_CARE_PROVIDER_SITE_OTHER): Payer: Medicare Other | Admitting: Cardiology

## 2015-03-30 VITALS — BP 100/72 | HR 72 | Ht 69.0 in | Wt 190.0 lb

## 2015-03-30 DIAGNOSIS — R0609 Other forms of dyspnea: Secondary | ICD-10-CM

## 2015-03-30 DIAGNOSIS — I472 Ventricular tachycardia: Secondary | ICD-10-CM

## 2015-03-30 DIAGNOSIS — Z95810 Presence of automatic (implantable) cardiac defibrillator: Secondary | ICD-10-CM

## 2015-03-30 DIAGNOSIS — I10 Essential (primary) hypertension: Secondary | ICD-10-CM

## 2015-03-30 DIAGNOSIS — E785 Hyperlipidemia, unspecified: Secondary | ICD-10-CM

## 2015-03-30 DIAGNOSIS — I5022 Chronic systolic (congestive) heart failure: Secondary | ICD-10-CM

## 2015-03-30 DIAGNOSIS — I420 Dilated cardiomyopathy: Secondary | ICD-10-CM

## 2015-03-30 DIAGNOSIS — I4729 Other ventricular tachycardia: Secondary | ICD-10-CM

## 2015-03-30 NOTE — Assessment & Plan Note (Signed)
Blood pressure controlled. Continue present medications. 

## 2015-03-30 NOTE — Patient Instructions (Signed)
Your physician wants you to follow-up in: 6 MONTHS WITH DR CRENSHAW You will receive a reminder letter in the mail two months in advance. If you don't receive a letter, please call our office to schedule the follow-up appointment.  

## 2015-03-30 NOTE — Assessment & Plan Note (Signed)
Continue statin. 

## 2015-03-30 NOTE — Assessment & Plan Note (Signed)
Patient has been evaluated by pulmonary. I do not feel this is related to amiodarone. It is felt more likely to be secondary to deconditioning and restrictive lung disease. He is now in pulmonary rehabilitation.

## 2015-03-30 NOTE — Assessment & Plan Note (Signed)
Continue amiodarone. When he returns in 6 months we will plan to repeat chest x-ray, TSH and liver functions.

## 2015-03-30 NOTE — Assessment & Plan Note (Signed)
Continue ACE inhibitor and beta blocker. 

## 2015-03-30 NOTE — Assessment & Plan Note (Signed)
Patient is euvolemic on examination. Continue present dose of Lasix. Patient with allergy to spironolactone.

## 2015-03-30 NOTE — Assessment & Plan Note (Signed)
Followed by electrophysiology. 

## 2015-04-01 ENCOUNTER — Encounter (HOSPITAL_COMMUNITY)
Admission: RE | Admit: 2015-04-01 | Discharge: 2015-04-01 | Disposition: A | Discharge status: Home / Self Care | Payer: Medicare Other | Source: Ambulatory Visit | Attending: Internal Medicine | Admitting: Internal Medicine

## 2015-04-01 DIAGNOSIS — J984 Other disorders of lung: Secondary | ICD-10-CM | POA: Diagnosis not present

## 2015-04-01 NOTE — Progress Notes (Signed)
Today, [name] exercised at [facility]. [facility]. Service time was from 1030 to 1215.  The patient exercised by performing aerobic, strengthening, and stretching exercises. Oxygen saturation, heart rate, blood pressure, rate of perceived exertion, and shortness of breath were all monitored before, during, and after exercise. [name] presented with no problems at today's exercise session. [name] also attended an education session on risk factor reduction.  The patient did not have an increase in workload intensity during today's exercise session.  Pre-exercise vitals: . Weight kg: 86.9 . Liters of O2: ra . SpO2: 98 . HR: 67 . BP: 104/58 . CBG: 187  Exercise vitals: . Highest heartrate:  81 . Lowest oxygen saturation: 94 . Highest blood pressure: 122/60 . Liters of 02: ra  Post-exercise vitals: . SpO2: 99 . HR: 71 . BP: 104/64 . Liters of O2: ra . CBG: 166  Dr. Brand Males, Medical Director Dr. Tamala Julian is immediately available during today's Pulmonary Rehab session for [place] on 04/01/2015 at 1030 class time.

## 2015-04-06 ENCOUNTER — Encounter (HOSPITAL_COMMUNITY)
Admission: RE | Admit: 2015-04-06 | Discharge: 2015-04-06 | Disposition: A | Discharge status: Home / Self Care | Payer: Medicare Other | Source: Ambulatory Visit | Attending: Internal Medicine | Admitting: Internal Medicine

## 2015-04-06 DIAGNOSIS — J984 Other disorders of lung: Secondary | ICD-10-CM | POA: Diagnosis not present

## 2015-04-06 NOTE — Progress Notes (Signed)
Today, Remijio exercised at [facility]. [facility]. Service time was from 10:30am to 12:05pm.  The patient exercised by performing aerobic, strengthening, and stretching exercises. Oxygen saturation, heart rate, blood pressure, rate of perceived exertion, and shortness of breath were all monitored before, during, and after exercise. Lindberg presented with no problems at today's exercise session.  The patient did have an increase in workload intensity during today's exercise session.  Pre-exercise vitals: . Weight kg: 86.1 . Liters of O2: ra . SpO2: 100 . HR: 67 . BP: 98/60 . CBG: 135  Exercise vitals: . Highest heartrate:  87 . Lowest oxygen saturation: 93 . Highest blood pressure: 100/69 . Liters of 02: ra  Post-exercise vitals: . SpO2: 98 . HR: 67 . BP: 139 . Liters of O2: ra . CBG: 139  Dr. Brand Males, Medical Director Dr. Tamala Julian is immediately available during today's Pulmonary Rehab session for [place] on 04/06/15 at 10:30am class time

## 2015-04-07 ENCOUNTER — Ambulatory Visit (INDEPENDENT_AMBULATORY_CARE_PROVIDER_SITE_OTHER)
Admission: RE | Admit: 2015-04-07 | Discharge: 2015-04-07 | Disposition: A | Discharge status: Home / Self Care | Payer: Medicare Other | Source: Ambulatory Visit | Attending: Internal Medicine | Admitting: Internal Medicine

## 2015-04-07 ENCOUNTER — Ambulatory Visit (INDEPENDENT_AMBULATORY_CARE_PROVIDER_SITE_OTHER): Payer: Medicare Other | Admitting: Internal Medicine

## 2015-04-07 ENCOUNTER — Encounter: Payer: Self-pay | Admitting: Internal Medicine

## 2015-04-07 ENCOUNTER — Other Ambulatory Visit (INDEPENDENT_AMBULATORY_CARE_PROVIDER_SITE_OTHER): Payer: Medicare Other

## 2015-04-07 VITALS — BP 110/78 | HR 79 | Temp 98.3°F | Resp 16 | Ht 69.0 in | Wt 188.0 lb

## 2015-04-07 DIAGNOSIS — N183 Chronic kidney disease, stage 3 unspecified: Secondary | ICD-10-CM

## 2015-04-07 DIAGNOSIS — M542 Cervicalgia: Secondary | ICD-10-CM | POA: Insufficient documentation

## 2015-04-07 DIAGNOSIS — E1121 Type 2 diabetes mellitus with diabetic nephropathy: Secondary | ICD-10-CM | POA: Diagnosis not present

## 2015-04-07 LAB — BASIC METABOLIC PANEL
BUN: 40 mg/dL — AB (ref 6–23)
CHLORIDE: 107 meq/L (ref 96–112)
CO2: 26 mEq/L (ref 19–32)
Calcium: 9 mg/dL (ref 8.4–10.5)
Creatinine, Ser: 1.64 mg/dL — ABNORMAL HIGH (ref 0.40–1.50)
GFR: 53.15 mL/min — AB (ref >60.00)
Glucose, Bld: 136 mg/dL — ABNORMAL HIGH (ref 70–99)
POTASSIUM: 3.9 meq/L (ref 3.5–5.1)
SODIUM: 140 meq/L (ref 135–145)

## 2015-04-07 LAB — CBC WITH DIFFERENTIAL/PLATELET
BASOS PCT: 0.8 % (ref 0.0–3.0)
Basophils Absolute: 0 10*3/uL (ref 0.0–0.1)
EOS PCT: 4.1 % (ref 0.0–5.0)
Eosinophils Absolute: 0.2 10*3/uL (ref 0.0–0.7)
HEMATOCRIT: 36.1 % — AB (ref 39.0–52.0)
HEMOGLOBIN: 11.4 g/dL — AB (ref 13.0–17.0)
LYMPHS PCT: 13.1 % (ref 12.0–46.0)
Lymphs Abs: 0.8 10*3/uL (ref 0.7–4.0)
MCHC: 31.7 g/dL (ref 30.0–36.0)
MCV: 79.6 fl (ref 78.0–100.0)
MONOS PCT: 14.6 % — AB (ref 3.0–12.0)
Monocytes Absolute: 0.9 10*3/uL (ref 0.1–1.0)
Neutro Abs: 3.9 10*3/uL (ref 1.4–7.7)
Neutrophils Relative %: 67.4 % (ref 43.0–77.0)
Platelets: 196 10*3/uL (ref 150.0–400.0)
RBC: 4.53 Mil/uL (ref 4.22–5.81)
RDW: 18.7 % — AB (ref 11.5–15.5)
WBC: 5.9 10*3/uL (ref 4.0–10.5)

## 2015-04-07 LAB — HEMOGLOBIN A1C: HEMOGLOBIN A1C: 5.7 % (ref 4.6–6.5)

## 2015-04-07 NOTE — Progress Notes (Signed)
Subjective:  Patient ID: [name], male    DOB: [birth year]  Age: 74 y.o. MRN: [id number]  CC: Neck Pain   HPI Jarek L Marcantonio presents for pain on the right side of his neck for about 2 weeks. He described it as an aching and a pressure sensation that sometimes radiates up into the back of his right posterior scalp. He denies any recent trauma or injury. He says the pain does not radiate into his arms. He denies any numbness, weakness, or tingling in his arms or legs.  Outpatient Prescriptions Prior to Visit  Medication Sig Dispense Refill  . acetaminophen (TYLENOL) 325 MG tablet Take 2 tablets (650 mg total) by mouth every 4 (four) hours as needed for headache or mild pain.    Marland Kitchen amiodarone (PACERONE) 200 MG tablet Take 100 mg by mouth daily.  3  . aspirin 81 MG tablet Take 81 mg by mouth daily.      . carvedilol (COREG) 25 MG tablet Take 25 mg by mouth 2 (two) times daily with a meal.      . Cholecalciferol (VITAMIN D-3) 1000 UNITS CAPS Take 1,000 Units by mouth 2 (two) times daily.     Marland Kitchen COLCRYS 0.6 MG tablet Take 0.6 mg by mouth 2 (two) times daily as needed (for gout symptoms).     Marland Kitchen digoxin (LANOXIN) 0.125 MG tablet Take 125 mcg by mouth daily.      . Febuxostat 80 MG TABS Take 2 tablets by mouth in the morning and take 1 tablet by mouth in the evening    . folic acid (FOLVITE) 1 MG tablet Take 1 mg by mouth daily.    . furosemide (LASIX) 40 MG tablet Take 40 mg by mouth daily.     . Linaclotide (LINZESS) 145 MCG CAPS capsule Take 145 mcg by mouth daily as needed (constipation).    Marland Kitchen lisinopril (PRINIVIL,ZESTRIL) 5 MG tablet Take 5 mg by mouth daily.     . Methotrexate Sodium, PF, 50 MG/2ML SOLN Inject 0.8 mLs as directed once a week. Take on sundays    . omeprazole (PRILOSEC) 40 MG capsule Take 1 capsule (40 mg total) by mouth daily. 30 capsule 11  . simvastatin (ZOCOR) 20 MG tablet Take 10 mg by mouth daily.     Marland Kitchen HYDROcodone-acetaminophen (NORCO) 10-325 MG per tablet Take 1 tablet  by mouth every 8 (eight) hours as needed for moderate pain or severe pain.    . TRADJENTA 5 MG TABS tablet TAKE 1 TABLET BY MOUTH DAILY FOR DIABETES 90 tablet 2   No facility-administered medications prior to visit.    ROS Review of Systems  Constitutional: Negative.  Negative for fever, chills, diaphoresis, appetite change and fatigue.  HENT: Negative.   Eyes: Negative.   Respiratory: Negative.  Negative for cough, choking, chest tightness, shortness of breath and stridor.   Cardiovascular: Negative.  Negative for chest pain, palpitations and leg swelling.  Gastrointestinal: Negative.  Negative for nausea, vomiting, abdominal pain, diarrhea, constipation and blood in stool.  Endocrine: Negative.   Genitourinary: Negative.   Musculoskeletal: Positive for neck pain. Negative for myalgias, back pain, joint swelling, arthralgias, gait problem and neck stiffness.  Skin: Negative.   Neurological: Negative.  Negative for dizziness, tremors, weakness, light-headedness, numbness and headaches.  Hematological: Negative.  Negative for adenopathy. Does not bruise/bleed easily.  Psychiatric/Behavioral: Negative.     Objective:  BP 110/78 mmHg  Pulse 79  Temp(Src) 98.3 F (36.8 C) (Oral)  Ht 5\' 9"  (1.753 m)  Wt 188 lb (85.276 kg)  BMI 27.75 kg/m2  SpO2 98%  BP Readings from Last 3 Encounters:  04/07/15 110/78  03/30/15 100/72  03/15/15 113/64    Wt Readings from Last 3 Encounters:  04/07/15 188 lb (85.276 kg)  03/30/15 190 lb (86.183 kg)  02/23/15 194 lb (87.998 kg)    Physical Exam  Constitutional: He is oriented to person, place, and time. He appears well-developed and well-nourished. No distress.  HENT:  Head: Normocephalic and atraumatic.  Mouth/Throat: Oropharynx is clear and moist. No oropharyngeal exudate.  Eyes: Conjunctivae are normal. Right eye exhibits no discharge. Left eye exhibits no discharge. No scleral icterus.  Neck: Normal range of motion. Neck supple. No JVD  present. No tracheal deviation present. No thyromegaly present.  Cardiovascular: Normal rate, regular rhythm, normal heart sounds and intact distal pulses.  Exam reveals no gallop and no friction rub.   No murmur heard. Pulmonary/Chest: Effort normal and breath sounds normal. No stridor. No respiratory distress. He has no wheezes. He has no rales. He exhibits no tenderness.  Abdominal: Soft. Bowel sounds are normal. He exhibits no distension and no mass. There is no tenderness. There is no rebound and no guarding.  Musculoskeletal: Normal range of motion. He exhibits no edema or tenderness.       Cervical back: Normal. He exhibits normal range of motion, no tenderness, no bony tenderness, no swelling, no edema, no deformity, no laceration, no pain, no spasm and normal pulse.  Lymphadenopathy:    He has no cervical adenopathy.  Neurological: He is alert and oriented to person, place, and time. He has normal reflexes. He displays normal reflexes. No cranial nerve deficit. He exhibits normal muscle tone. Coordination normal.  Skin: Skin is warm and dry. No rash noted. He is not diaphoretic. No erythema. No pallor.    Lab Results  Component Value Date   WBC 5.9 04/07/2015   HGB 11.4* 04/07/2015   HCT 36.1* 04/07/2015   PLT 196.0 04/07/2015   GLUCOSE 136* 04/07/2015   CHOL 117 08/13/2014   TRIG 48 08/13/2014   HDL 40 08/13/2014   LDLCALC 67 08/13/2014   ALT 47 12/14/2014   AST 32 12/14/2014   NA 140 04/07/2015   K 3.9 04/07/2015   CL 107 04/07/2015   CREATININE 1.64* 04/07/2015   BUN 40* 04/07/2015   CO2 26 04/07/2015   TSH 1.57 12/14/2014   PSA <0.01 ng/mL* 11/09/2008   INR 3.08* 09/02/2014   HGBA1C 5.7 04/07/2015   MICROALBUR 1.0 07/28/2014   Dg Cervical Spine Complete  04/07/2015  CLINICAL DATA:  Pain.  No known injury.  Initial evaluation . EXAM: CERVICAL SPINE  4+ VIEWS COMPARISON:  None. FINDINGS: Patient's it is angulated to the right. This could be related to torticollis.  Diffuse degenerative change cervical spine. No fracture identified. Normal alignment and mineralization. Neural foramen patent. Metallic foreign body noted about the left base and neck. Pulmonary apices clear. IMPRESSION: 1. Diffuse degenerative change cervical spine. 2. Patient's head is angulated to the right. Torticollis could present this fashion. 3. Metallic foreign bodies noted about the left face and neck Electronically Signed   By: Marcello Moores  Register   On: 04/07/2015 12:33   No results found.  Assessment & Plan:   Kevante was seen today for neck pain.  Diagnoses and all orders for this visit:  Type 2 diabetes mellitus with diabetic nephropathy, without long-term current use of insulin ([place])- his A1C is  down to 5.7%, I have asked him to stop taking tradjenta -     Basic metabolic panel; Future -     Hemoglobin A1c; Future  Chronic renal disease, stage III- his renal function is stable. Will avoid nsaids and will maintain good BP control with ACEI. -     CBC with Differential/Platelet; Future -     Basic metabolic panel; Future  Neck pain on right side- his exam is normal. Pain films show DDD and wryneck. He does not want anything other than tylenol for the pain. -     DG Cervical Spine Complete; Future   I have discontinued Mr. Dellas Guard and HYDROcodone-acetaminophen. I am also having him maintain his aspirin, carvedilol, digoxin, furosemide, COLCRYS, lisinopril, methotrexate (PF), acetaminophen, simvastatin, Vitamin D-3, folic acid, Febuxostat, Linaclotide, omeprazole, amiodarone, ULORIC, PRECISION XTRA TEST STRIPS, and TECHLITE LANCETS.  Meds ordered this encounter  Medications  . ULORIC 40 MG tablet    Sig:   . PRECISION XTRA TEST STRIPS test strip    Sig:   . TECHLITE LANCETS MISC    Sig:   . DISCONTD: lisinopril (PRINIVIL,ZESTRIL) 10 MG tablet    Sig:      Follow-up: Return in about 3 weeks (around 04/28/2015).  Scarlette Calico, MD

## 2015-04-07 NOTE — Progress Notes (Signed)
Pre visit review using our clinic review tool, if applicable. No additional management support is needed unless otherwise documented below in the visit note. 

## 2015-04-07 NOTE — Patient Instructions (Signed)
Acute Torticollis °Torticollis is a condition in which the muscles of the neck tighten (contract) abnormally, causing the neck to twist and the head to move into an unnatural position. Torticollis that develops suddenly is called acute torticollis. If torticollis becomes chronic and is left untreated, the face and neck can become deformed. °CAUSES °This condition may be caused by: °· Sleeping in an awkward position (common). °· Extending or twisting the neck muscles beyond their normal position. °· Infection. °In some cases, the cause may not be known. °SYMPTOMS °Symptoms of this condition include: °· An unnatural position of the head. °· Neck pain. °· A limited ability to move the neck. °· Twisting of the neck to one side. °DIAGNOSIS °This condition is diagnosed with a physical exam. You may also have imaging tests, such as an X-ray, CT scan, or MRI. °TREATMENT °Treatment for this condition involves trying to relax the neck muscles. It may include: °· Medicines or shots. °· Physical therapy. °· Surgery. This may be done in severe cases. °HOME CARE INSTRUCTIONS °· Take medicines only as directed by your health care provider. °· Do stretching exercises and massage your neck as directed by your health care provider. °· Keep all follow-up visits as directed by your health care provider. This is important. °SEEK MEDICAL CARE IF: °· You develop a fever. °SEEK IMMEDIATE MEDICAL CARE IF: °· You develop difficulty breathing. °· You develop noisy breathing (stridor). °· You start drooling. °· You have trouble swallowing or have pain with swallowing. °· You develop numbness or weakness in your hands or feet. °· You have changes in your speech, understanding, or vision. °· Your pain gets worse. °  °This information is not intended to replace advice given to you by your health care provider. Make sure you discuss any questions you have with your health care provider. °  °Document Released: 06/02/2000 Document Revised:  10/20/2014 Document Reviewed: 06/01/2014 °Elsevier Interactive Patient Education ©2016 [employer] ° °

## 2015-04-08 ENCOUNTER — Encounter (HOSPITAL_COMMUNITY)
Admission: RE | Admit: 2015-04-08 | Discharge: 2015-04-08 | Disposition: A | Discharge status: Home / Self Care | Payer: Medicare Other | Source: Ambulatory Visit | Attending: Internal Medicine | Admitting: Internal Medicine

## 2015-04-08 DIAGNOSIS — J984 Other disorders of lung: Secondary | ICD-10-CM | POA: Diagnosis not present

## 2015-04-08 NOTE — Progress Notes (Signed)
Today, Matheo exercised at [facility]. [facility]. Service time was from 10:30am to 12:31pm.  The patient exercised by performing aerobic, strengthening, and stretching exercises. Oxygen saturation, heart rate, blood pressure, rate of perceived exertion, and shortness of breath were all monitored before, during, and after exercise. Asiah presented with no problems at today's exercise session.  The patient did not have an increase in workload intensity during today's exercise session.  Pre-exercise vitals: . Weight kg: 86.5 . Liters of O2: ra . SpO2: 99 . HR: 64 . BP: 100/46 . CBG: 136  Exercise vitals: . Highest heartrate:  86 . Lowest oxygen saturation: 94 . Highest blood pressure: 110/64 . Liters of 02: ra  Post-exercise vitals: . SpO2: 95 . HR: 71 . BP: 102/68 . Liters of O2: ra . CBG: 132  Dr. Brand Males, Medical Director Dr. Wynelle Cleveland is immediately available during today's Pulmonary Rehab session for [place] on 04/08/15 at 10:30am class time.

## 2015-04-13 ENCOUNTER — Encounter (HOSPITAL_COMMUNITY)
Admission: RE | Admit: 2015-04-13 | Discharge: 2015-04-13 | Disposition: A | Discharge status: Home / Self Care | Payer: Medicare Other | Source: Ambulatory Visit | Attending: Internal Medicine | Admitting: Internal Medicine

## 2015-04-13 DIAGNOSIS — J984 Other disorders of lung: Secondary | ICD-10-CM | POA: Diagnosis not present

## 2015-04-13 NOTE — Progress Notes (Signed)
Today, Eliam exercised at [facility]. [facility]. Service time was from 1030 to 1200.  The patient exercised by performing aerobic, strengthening, and stretching exercises. Oxygen saturation, heart rate, blood pressure, rate of perceived exertion, and shortness of breath were all monitored before, during, and after exercise. [name] presented with no problems at today's exercise session.  The patient did  have an increase in workload intensity during today's exercise session.  Pre-exercise vitals: . Weight kg: 85.0 . Liters of O2: RA . SpO2: 98 . HR: 69 . BP: 104/70 . CBG: 164  Exercise vitals: . Highest heartrate:  93 . Lowest oxygen saturation: 97 . Highest blood pressure: 128/66 . Liters of 02: RA  Post-exercise vitals: . SpO2: 100 . HR: 65 . BP: 98/60 . Liters of O2: RA . CBG: 154 Dr. Brand Males, Medical Director Dr. Wynelle Cleveland is immediately available during today's Pulmonary Rehab session for [place] on 04/13/2015  at 1030 class time.  Marland Kitchen  Marland Kitchen

## 2015-04-15 ENCOUNTER — Encounter (HOSPITAL_COMMUNITY)
Admission: RE | Admit: 2015-04-15 | Discharge: 2015-04-15 | Disposition: A | Discharge status: Home / Self Care | Payer: Medicare Other | Source: Ambulatory Visit | Attending: Internal Medicine | Admitting: Internal Medicine

## 2015-04-15 DIAGNOSIS — J984 Other disorders of lung: Secondary | ICD-10-CM | POA: Diagnosis not present

## 2015-04-15 NOTE — Progress Notes (Signed)
Today, [name] exercised at [facility]. [facility]. Service time was from 1030 to 1205.  The patient exercised by performing aerobic, strengthening, and stretching exercises. Oxygen saturation, heart rate, blood pressure, rate of perceived exertion, and shortness of breath were all monitored before, during, and after exercise. [name] presented with no problems at today's exercise session. [name] also attended an education session on pursed lip and diaphragmatic breathing.  The patient did not have an increase in workload intensity during today's exercise session.  Pre-exercise vitals: . Weight kg: 85.8 . Liters of O2: ra . SpO2: 99 . HR: 70 . BP: 102/60 . CBG: 177  Exercise vitals: . Highest heartrate:  91 . Lowest oxygen saturation: 97 . Highest blood pressure: 120/60 . Liters of 02: ra  Post-exercise vitals: . SpO2: 100 . HR: 71 . BP: 100/62 . Liters of O2: ra . CBG: 132  Dr. Brand Males, Medical Director Dr. Tamala Julian is immediately available during today's Pulmonary Rehab session for [place] on 04/15/2015 at 1030 class time

## 2015-04-19 ENCOUNTER — Encounter: Payer: Medicare Other | Admitting: Nurse Practitioner

## 2015-04-20 ENCOUNTER — Encounter (HOSPITAL_COMMUNITY)
Admission: RE | Admit: 2015-04-20 | Discharge: 2015-04-20 | Disposition: A | Discharge status: Home / Self Care | Payer: Medicare Other | Source: Ambulatory Visit | Attending: Internal Medicine | Admitting: Internal Medicine

## 2015-04-20 DIAGNOSIS — I429 Cardiomyopathy, unspecified: Secondary | ICD-10-CM | POA: Diagnosis not present

## 2015-04-20 DIAGNOSIS — J984 Other disorders of lung: Secondary | ICD-10-CM | POA: Insufficient documentation

## 2015-04-20 NOTE — Progress Notes (Signed)
Today, [name] exercised at [facility]. [facility]. Service time was from 10:30am to 12:15pm.  The patient exercised by performing aerobic, strengthening, and stretching exercises. Oxygen saturation, heart rate, blood pressure, rate of perceived exertion, and shortness of breath were all monitored before, during, and after exercise. [name] presented with no problems at today's exercise session.  The patient did have an increase in workload intensity during today's exercise session.  Pre-exercise vitals: . Weight kg: 85.0 . Liters of O2: ra . SpO2: 100 . HR: 68 . BP: 100/62 . CBG: 188  Exercise vitals: . Highest heartrate:  99 . Lowest oxygen saturation: 97 . Highest blood pressure:  . Liters of 02: ra  Post-exercise vitals: . SpO2: 100 . HR: 67 . BP: 98/54 . Liters of O2: ra . CBG: 148  Dr. Brand Males, Medical Director Dr. Marily Memos is immediately available during today's Pulmonary Rehab session for [place] on 04/20/15 at 10:30am class time

## 2015-04-20 NOTE — Progress Notes (Signed)
I have reviewed a Home Exercise Prescription with [place] . [name] is currently exercising at home.  The patient was advised to walk 2-3 days a week for 30 minutes.  Mahlik and I discussed how to progress their exercise prescription.  The patient stated that their goals were to decrease shortness of breath with activity and lose some weight.  The patient stated that they understand the exercise prescription.  We reviewed exercise guidelines, target heart rate during exercise, oxygen use, weather, home pulse oximeter, endpoints for exercise, and goals.  Patient is encouraged to come to me with any questions. I will continue to follow up with the patient to assist them with progression and safety.

## 2015-04-22 ENCOUNTER — Encounter (HOSPITAL_COMMUNITY): Payer: Medicare Other

## 2015-04-27 ENCOUNTER — Encounter (HOSPITAL_COMMUNITY)
Admission: RE | Admit: 2015-04-27 | Discharge: 2015-04-27 | Disposition: A | Discharge status: Home / Self Care | Payer: Medicare Other | Source: Ambulatory Visit | Attending: Internal Medicine | Admitting: Internal Medicine

## 2015-04-27 DIAGNOSIS — J984 Other disorders of lung: Secondary | ICD-10-CM | POA: Diagnosis not present

## 2015-04-27 NOTE — Progress Notes (Signed)
Today, [name] exercised at [facility]. [facility]. Service time was from 10:30am to 12:05pm.  The patient exercised by performing aerobic, strengthening, and stretching exercises. Oxygen saturation, heart rate, blood pressure, rate of perceived exertion, and shortness of breath were all monitored before, during, and after exercise. [name] presented with no problems at today's exercise session.  The patient did not have an increase in workload intensity during today's exercise session.  Pre-exercise vitals: . Weight kg: 86.2 . Liters of O2: ra . SpO2: 99 . HR: 70 . BP: 102/54 . CBG: 208  Exercise vitals: . Highest heartrate:  83 . Lowest oxygen saturation: 94 . Highest blood pressure: 106/70 . Liters of 02: ra  Post-exercise vitals: . SpO2: 100 . HR: 65 . BP: 94/54 . Liters of O2: ra . CBG: 143  Dr. Brand Males, Medical Director Dr. Ree Kida is immediately available during today's Pulmonary Rehab session for [name] on 11/816 at 10:30am class time.

## 2015-04-29 ENCOUNTER — Encounter (HOSPITAL_COMMUNITY)
Admission: RE | Admit: 2015-04-29 | Discharge: 2015-04-29 | Disposition: A | Discharge status: Home / Self Care | Payer: Medicare Other | Source: Ambulatory Visit | Attending: Internal Medicine | Admitting: Internal Medicine

## 2015-04-29 DIAGNOSIS — J984 Other disorders of lung: Secondary | ICD-10-CM | POA: Diagnosis not present

## 2015-04-29 NOTE — Progress Notes (Signed)
Today, [name] exercised at [facility]. [facility]. Service time was from 10:30am to 12:05pm.  The patient exercised by performing aerobic, strengthening, and stretching exercises. Oxygen saturation, heart rate, blood pressure, rate of perceived exertion, and shortness of breath were all monitored before, during, and after exercise. [name] presented with no problems at today's exercise session. Patient attended education today on COPD.   The patient did not have an increase in workload intensity during today's exercise session.  Pre-exercise vitals: . Weight kg: 86.4 . Liters of O2: ra . SpO2: 98 . HR: 69 . BP: 96/46 . CBG: 202  Exercise vitals: . Highest heartrate:  80 . Lowest oxygen saturation: 96 . Highest blood pressure: 129/56 . Liters of 02: ra  Post-exercise vitals: . SpO2: 96 . HR: 71 . BP: 102/64 . Liters of O2: ra . CBG: 125  Dr. Brand Males, Medical Director Dr. Marily Memos is immediately available during today's Pulmonary Rehab session for [name] on 04/29/15 at 10:30am class time

## 2015-05-04 ENCOUNTER — Encounter (HOSPITAL_COMMUNITY)
Admission: RE | Admit: 2015-05-04 | Discharge: 2015-05-04 | Disposition: A | Discharge status: Home / Self Care | Payer: Medicare Other | Source: Ambulatory Visit | Attending: Internal Medicine | Admitting: Internal Medicine

## 2015-05-04 DIAGNOSIS — J984 Other disorders of lung: Secondary | ICD-10-CM | POA: Diagnosis not present

## 2015-05-04 NOTE — Progress Notes (Signed)
Today, Amias exercised at [facility]. [facility]. Service time was from 1030 to 1200.  The patient exercised by performing aerobic, strengthening, and stretching exercises. Oxygen saturation, heart rate, blood pressure, rate of perceived exertion, and shortness of breath were all monitored before, during, and after exercise. Dailyn presented with no problems at today's exercise session.  The patient did not have an increase in workload intensity during today's exercise session.  Pre-exercise vitals: . Weight kg: 86.6 . Liters of O2: ra . SpO2: 100 . HR: 67 . BP: 100/52 . CBG: 249  Exercise vitals: . Highest heartrate:  81 . Lowest oxygen saturation: 95 . Highest blood pressure: 112/50 . Liters of 02: ra  Post-exercise vitals: . SpO2: 99 . HR: 46 . BP: 90/58 . Liters of O2: ra . CBG: 180 Dr. Brand Males, Medical Director Dr. Marily Memos is immediately available during today's Pulmonary Rehab session for [place] on 05/04/2015  at 1030 class time  .

## 2015-05-06 ENCOUNTER — Encounter (HOSPITAL_COMMUNITY)
Admission: RE | Admit: 2015-05-06 | Discharge: 2015-05-06 | Disposition: A | Discharge status: Home / Self Care | Payer: Medicare Other | Source: Ambulatory Visit | Attending: Internal Medicine | Admitting: Internal Medicine

## 2015-05-06 DIAGNOSIS — J984 Other disorders of lung: Secondary | ICD-10-CM | POA: Diagnosis not present

## 2015-05-06 NOTE — Progress Notes (Signed)
Today, [name] exercised at [facility]. [facility]. Service time was from 1030 to 1215.  The patient exercised by performing aerobic, strengthening, and stretching exercises. Oxygen saturation, heart rate, blood pressure, rate of perceived exertion, and shortness of breath were all monitored before, during, and after exercise. [name] presented with no problems at today's exercise session.Patient attended The Exercise for the Pulmonary Patient class today.  The patient did not have an increase in workload intensity during today's exercise session.  Pre-exercise vitals: . Weight kg: 86.8 . Liters of O2: ra . SpO2: 96 . HR: 65 . BP: 90/60 . CBG: 176  Exercise vitals: . Highest heartrate:  75 . Lowest oxygen saturation: 92 . Highest blood pressure: 100/60 . Liters of 02: ra  Post-exercise vitals: . SpO2: 99 . HR: 62 . BP: 98/60 . Liters of O2: ra . CBG: 194 Dr. Brand Males, Medical Director Dr. Cruzita Lederer is immediately available during today's Pulmonary Rehab session for [place] on 05/06/2015  at 1030 class time.  Marland Kitchen

## 2015-05-10 ENCOUNTER — Encounter: Payer: Self-pay | Admitting: Nurse Practitioner

## 2015-05-10 ENCOUNTER — Encounter: Payer: Self-pay | Admitting: Internal Medicine

## 2015-05-10 ENCOUNTER — Ambulatory Visit (INDEPENDENT_AMBULATORY_CARE_PROVIDER_SITE_OTHER): Payer: Medicare Other | Admitting: Nurse Practitioner

## 2015-05-10 VITALS — BP 100/50 | HR 68 | Ht 69.0 in | Wt 192.2 lb

## 2015-05-10 DIAGNOSIS — I472 Ventricular tachycardia, unspecified: Secondary | ICD-10-CM

## 2015-05-10 DIAGNOSIS — I10 Essential (primary) hypertension: Secondary | ICD-10-CM | POA: Diagnosis not present

## 2015-05-10 DIAGNOSIS — I5022 Chronic systolic (congestive) heart failure: Secondary | ICD-10-CM | POA: Diagnosis not present

## 2015-05-10 NOTE — Patient Instructions (Signed)
Medication Instructions: Your physician recommends that you continue on your current medications as directed. Please refer to the Current Medication list given to you today.     If you need a refill on your cardiac medications before your next appointment, please call your pharmacy.  Labwork: NONE ORDER TODAY    Testing/Procedures: NONE ORDER TODAY    Follow-Up:  Your physician wants you to follow-up in: [place] will receive a reminder letter in the mail two months in advance. If you don't receive a letter, please call our office to schedule the follow-up appointment.  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 This monitoring reduces the number of office visits required to check your device to one time per year. It allows [place] to keep an eye on the functioning of your device to ensure it is working properly. You are scheduled for a device check from home on .[place] February 2017 You may send your transmission at any time that day. If you have a wireless device, the transmission will be sent automatically. After your physician reviews your transmission, you will receive a postcard with your next transmission date.     Any Other Special Instructions Will Be Listed Below (If Applicable).

## 2015-05-10 NOTE — Progress Notes (Signed)
Electrophysiology Office Note Date: 05/10/2015  ID:  [name], Ramp Mar 08, 1941, MRN [id number]  PCP: Scarlette Calico, MD Primary Cardiologist: Stanford Breed Electrophysiologist: Allred  CC: Follow-up for ventricular tachycardia  [name] is a 74 y.o. male is seen today for Dr Rayann Heman.  He presents today for routine electrophysiology followup. He was admitted 07/2014 with appropriate ICD therapy for VT and was placed on amiodarone at that time.  Catheterization demonstrated no CAD.  PFT's were markedly abnormal and he was evaluated by pulmonary who felt that this was not amiodarone toxicity. He is participating in pulmonary rehab.   Since last being seen in our clinic, the patient reports doing very well. He denies chest pain, palpitations, dyspnea, PND, orthopnea, nausea, vomiting, dizziness, syncope, edema, weight gain, or early satiety.  He has not had recurrent ICD shocks.   Device History: STJ single chamber ICD implanted 2009 for NICM/primary prevention History of appropriate therapy: Yes History of AAD therapy: Yes - amiodarone   Past Medical History  Diagnosis Date  . Obesity   . Prostate cancer [facility])     s/p prostatectomy  . Bursitis of elbow     left  . Osteoarthritis   . HTN (hypertension)   . HLD (hyperlipidemia)   . History of DVT (deep vein thrombosis) March 2016    Left Axillary DVT  . Chronic systolic heart failure (HCC)     echo 4/13: EF 25-30%, mod MR, mod LAE, PASP 47  . NICM (nonischemic cardiomyopathy) ([place])     normal cors by cath in 1990s;  nuclear 8/08: no ischemia; inf/lat/dist ant/apical scar  . Moderate mitral regurgitation     echo 4/13  . Ventricular tachycardia ([place]) 07/2014  . Diabetes mellitus without complication ([place])   . Restrictive lung disease    Past Surgical History  Procedure Laterality Date  . Transurethral resection of prostate    . Cardiac defibrillator placement  10/09    STJ single chamber ICD   . Hernia repair    . Robot  assisted laparoscopic radical prostatectomy  6/08  . Left and right heart catheterization with coronary angiogram N/A 08/13/2014    Procedure: LEFT AND RIGHT HEART CATHETERIZATION WITH CORONARY ANGIOGRAM;  Surgeon: Sinclair Grooms, MD;  Location: [facility] CATH LAB;  Service: Cardiovascular;  Laterality: N/A;    Current Outpatient Prescriptions  Medication Sig Dispense Refill  . acetaminophen (TYLENOL) 325 MG tablet Take 2 tablets (650 mg total) by mouth every 4 (four) hours as needed for headache or mild pain.    Marland Kitchen amiodarone (PACERONE) 200 MG tablet Take 100 mg by mouth daily.  3  . aspirin 81 MG tablet Take 81 mg by mouth daily.      . carvedilol (COREG) 25 MG tablet Take 25 mg by mouth 2 (two) times daily with a meal.      . Cholecalciferol (VITAMIN D-3) 1000 UNITS CAPS Take 1,000 Units by mouth 2 (two) times daily.     Marland Kitchen COLCRYS 0.6 MG tablet Take 0.6 mg by mouth 2 (two) times daily as needed (for gout symptoms).     Marland Kitchen digoxin (LANOXIN) 0.125 MG tablet Take 125 mcg by mouth daily.      . Febuxostat 80 MG TABS Take 2 tablets by mouth 2 (two) times daily.     . folic acid (FOLVITE) 1 MG tablet Take 1 mg by mouth daily.    . furosemide (LASIX) 40 MG tablet Take 40 mg by mouth daily.     Marland Kitchen  Linaclotide (LINZESS) 145 MCG CAPS capsule Take 145 mcg by mouth daily as needed (constipation).    Marland Kitchen lisinopril (PRINIVIL,ZESTRIL) 5 MG tablet Take 5 mg by mouth daily.     . Methotrexate Sodium, PF, 50 MG/2ML SOLN Inject 0.8 mLs as directed once a week. Take on sundays    . omeprazole (PRILOSEC) 40 MG capsule Take 1 capsule (40 mg total) by mouth daily. 30 capsule 11  . PRECISION XTRA TEST STRIPS test strip     . simvastatin (ZOCOR) 20 MG tablet Take 10 mg by mouth daily.     . TECHLITE LANCETS MISC     . TRADJENTA 5 MG TABS tablet Take 1 tablet by mouth daily.  2   No current facility-administered medications for this visit.    Allergies:   Spironolactone   Social History: Social History   Social  History  . Marital Status: Married    Spouse Name: N/A  . Number of Children: 3  . Years of Education: N/A   Occupational History  . Tour manager     Retired   Social History Main Topics  . Smoking status: Former Smoker -- 0.50 packs/day for 10 years    Types: Cigarettes    Start date: 02/17/1966    Quit date: 10/18/1975  . Smokeless tobacco: Former Systems developer    Quit date: 11/12/1975  . Alcohol Use: No     Comment: Remote use over 5 years ago  . Drug Use: No  . Sexual Activity: Not Currently   Other Topics Concern  . Not on file   Social History Narrative   Originally from [facility]. He has lived in [place]. He lived in [place] since 1989. 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 Spent a short period in [place]. He has also traveled to [place] [facility] remotely. 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 Then he worked in [employer] until he retired. No pets currently. No bird, mold, or hot tub exposure. He enjoys working on his truck. No known asbestos exposure. No inhaled chemicals or fumes exposure.     Family History: Family History  Problem Relation Age of Onset  . Prostate cancer Father   . Heart disease Mother   . Lung disease Neg Hx   . Rheumatologic disease Neg Hx   . Heart disease Son   . Renal Disease Son     Review of Systems: All other systems reviewed and are otherwise negative except as noted above.   Physical Exam: VS:  BP 100/50 mmHg  Pulse 68  Ht 5\' 9"  (1.753 m)  Wt 192 lb 3.2 oz (87.181 kg)  BMI 28.37 kg/m2 , BMI Body mass index is 28.37 kg/(m^2).  GEN- The patient is well appearing, alert and oriented x 3 today.   HEENT: normocephalic, atraumatic; sclera clear, conjunctiva pink; hearing intact; oropharynx clear; neck supple Lungs- Clear to ausculation bilaterally, normal work of breathing.  No wheezes, rales, rhonchi Heart- Regular rate and rhythm GI- soft, non-tender, non-distended, bowel sounds present, no hepatosplenomegaly Extremities- no clubbing,  cyanosis, or edema; DP/PT/radial pulses 2+ bilaterally MS- no significant deformity or atrophy Skin- warm and dry, no rash or lesion; ICD pocket well healed Psych- euthymic mood, full affect Neuro- strength and sensation are intact  ICD interrogation- reviewed in detail today,  See PACEART report  EKG:  EKG is not ordered today.  Recent Labs: 08/13/2014: Magnesium 1.8 09/19/2014: B Natriuretic Peptide 131.4* 12/14/2014: ALT 47; TSH 1.57 04/07/2015: BUN  40*; Creatinine, Ser 1.64*; Hemoglobin 11.4*; Platelets 196.0; Potassium 3.9; Sodium 140   Wt Readings from Last 3 Encounters:  05/10/15 192 lb 3.2 oz (87.181 kg)  04/07/15 188 lb (85.276 kg)  03/30/15 190 lb (86.183 kg)     Assessment and Plan:  1.  Ventricular tachycardia No recent recurrence Continue Amiodarone 100mg  daily Recent LFT's/TFT's normal (April 2016) PFT's followed by pulmonary  2. Chronic systolic dysfunction euvolemic today Stable on an appropriate medical regimen Normal ICD function See Pace Art report  3.  HTN Stable No change required today   Current medicines are reviewed at length with the patient today.   The patient does not have concerns regarding his medicines.  The following changes were made today:  none  Labs/ tests ordered today include: none   Disposition:   Follow up with Dr Stanford Breed as scheduled, Dr Rayann Heman in 1 year, Merlin transmissions   Signed, Chanetta Marshall, NP 05/10/2015 11:59 AM  [place] [address] [place] [address] [phone] (office) [phone] (fax)

## 2015-05-11 ENCOUNTER — Encounter (HOSPITAL_COMMUNITY)
Admission: RE | Admit: 2015-05-11 | Discharge: 2015-05-11 | Disposition: A | Discharge status: Home / Self Care | Payer: Medicare Other | Source: Ambulatory Visit | Attending: Internal Medicine | Admitting: Internal Medicine

## 2015-05-11 DIAGNOSIS — J984 Other disorders of lung: Secondary | ICD-10-CM | POA: Diagnosis not present

## 2015-05-11 NOTE — Progress Notes (Signed)
Today, [name] exercised at [facility]. [facility]. Service time was from 1030 to 1215.  The patient exercised by performing aerobic, strengthening, and stretching exercises. Oxygen saturation, heart rate, blood pressure, rate of perceived exertion, and shortness of breath were all monitored before, during, and after exercise. Child presented with no problems at today's exercise session.He attended oxygen safety class today.  The patient did  have an increase in workload intensity during today's exercise session.  Pre-exercise vitals: . Weight kg: 86.4 . Liters of O2: RA . SpO2: 98 . HR: 71 . BP: 98/60 . CBG: 183  Exercise vitals: . Highest heartrate:  94 . Lowest oxygen saturation: 94 . Highest blood pressure: 116/66 . Liters of 02: RA    Post-exercise vitals: . SpO2: 100 . HR: 70 . BP: 102/70 . Liters of O2: RA . CBG: 105 Dr. Brand Males, Medical Director Dr. Tana Coast is immediately available during today's Pulmonary Rehab session for [name] on 05/11/2015  at 1030 class time.

## 2015-05-11 NOTE — Progress Notes (Signed)
[name] 74 y.o. male Nutrition Note Spoke with pt. Pt is obese. Pt with slow wt loss of the past year of 19.2 lb. Pt wt appears to range from 85-87 kg since starting Pulmonary Rehab. Rate of wt loss appears safe. There are some ways the pt can make healthier food choices.  Pt's Rate Your Plate results reviewed with pt. Pt avoids most salty foods; rarely uses canned/ convenience food.  Pt does not add salt to food.  The role of sodium in lung disease reviewed with pt. Pt expressed understanding of the information reviewed. Pt is diabetic.  Pt checks CBG's once daily usually after a meal.  CBG's reportedly "in the 180's mg/dL after a meal."  Pt A1c indicates blood glucose well-controlled. Given pt wt loss, increased activity, and low A1c, pt asked about any s/s of hypoglycemia. Pt denies any difficulty with hypoglycemia. Pt reports he has been educated re: DM from the [place]. Pt expressed understanding of the information reviewed.   Lab Results  Component Value Date   HGBA1C 5.7 04/07/2015   Vitals - 1 value per visit 05/10/2015 04/07/2015 03/30/2015 03/15/2015  Weight (lb) 192.2 188 190    Vitals - 1 value per visit 02/23/2015 01/26/2015 01/22/2015 01/13/2015 01/05/2015  Weight (lb) 194 199 195.4 198.4 197.5   Vitals - 1 value per visit 12/14/2014 11/05/2014 11/02/2014 10/12/2014 09/21/2014  Weight (lb) 196.08 200.2 201.2 201 196.4   Vitals - 1 value per visit 09/20/2014 09/19/2014 09/14/2014 09/07/2014 09/03/2014  Weight (lb)   193 193.3    Vitals - 1 value per visit 09/02/2014 09/02/2014 09/02/2014 08/30/2014  Weight (lb)  199  197   Vitals - 1 value per visit 08/27/2014 08/23/2014 08/21/2014 08/18/2014 08/18/2014  Weight (lb) 201 204 204.5  206   Vitals - 1 value per visit 08/16/2014 08/15/2014 07/28/2014 04/10/2014  Weight (lb) 208.11  209 211.4    Nutrition Diagnosis ? Food-and nutrition-related knowledge deficit related to lack of exposure to information as related to diagnosis of pulmonary disease ? Overweight  related to excessive energy intake as evidenced by a BMI of 28.4 ?  Nutrition Rx/Est. Daily Nutrition Needs for: ? wt loss 1400-1900 Kcal  70-90 gm protein   1500 mg or less sodium     175-250 gm CHO Nutrition Intervention ? Pt's individual nutrition plan and goals reviewed with pt. ? Benefits of adopting healthy eating habits discussed when pt's Rate Your Plate reviewed. ? Pt to attend the Nutrition and Lung Disease class ? Continual client-centered nutrition education by RD, as part of interdisciplinary care. Goal(s) 1. Identify food quantities necessary to achieve wt loss of  -2# per week to a goal wt of 76.5-84.7 kg (168-186 lb) at graduation from pulmonary rehab. 2. Describe the benefit of including fruits, vegetables, whole grains, and low-fat dairy products in a healthy meal plan. Monitor and Evaluate progress toward nutrition goal with team.   Derek Mound, M.Ed, RD, LDN, CDE 05/11/2015 2:29 PM

## 2015-05-13 ENCOUNTER — Encounter (HOSPITAL_COMMUNITY): Payer: Medicare Other

## 2015-05-18 ENCOUNTER — Encounter (HOSPITAL_COMMUNITY)
Admission: RE | Admit: 2015-05-18 | Discharge: 2015-05-18 | Disposition: A | Discharge status: Home / Self Care | Payer: Medicare Other | Source: Ambulatory Visit | Attending: Internal Medicine | Admitting: Internal Medicine

## 2015-05-18 DIAGNOSIS — J984 Other disorders of lung: Secondary | ICD-10-CM | POA: Diagnosis not present

## 2015-05-18 LAB — GLUCOSE, CAPILLARY
Glucose-Capillary: 120 mg/dL — ABNORMAL HIGH (ref 65–99)
Glucose-Capillary: 110 mg/dL — ABNORMAL HIGH (ref 65–99)

## 2015-05-18 NOTE — Progress Notes (Signed)
Today, [name] exercised at [facility]. [facility]. Service time was from 10:30am to 12:15pm.  The patient exercised by performing aerobic, strengthening, and stretching exercises. Oxygen saturation, heart rate, blood pressure, rate of perceived exertion, and shortness of breath were all monitored before, during, and after exercise. [name] presented with no problems at today's exercise session.  The patient did not have an increase in workload intensity during today's exercise session.  Pre-exercise vitals: . Weight kg: 86.7 . Liters of O2: ra . SpO2: 98 . HR: 67 . BP: 122/62 . CBG: 120  Exercise vitals: . Highest heartrate:  78 . Lowest oxygen saturation: 90 . Highest blood pressure: 118/68 . Liters of 02: ra  Post-exercise vitals: . SpO2: 98 . HR: 63 . BP: 102/64 . Liters of O2: ra . CBG: 110  Dr. Brand Males, Medical Director Dr. Frederic Jericho is immediately available during today's Pulmonary Rehab session for [place] on 05/18/15 at 10:30am class time.

## 2015-05-20 ENCOUNTER — Encounter (HOSPITAL_COMMUNITY)
Admission: RE | Admit: 2015-05-20 | Discharge: 2015-05-20 | Disposition: A | Discharge status: Home / Self Care | Payer: Medicare Other | Source: Ambulatory Visit | Attending: Internal Medicine | Admitting: Internal Medicine

## 2015-05-20 DIAGNOSIS — J984 Other disorders of lung: Secondary | ICD-10-CM | POA: Insufficient documentation

## 2015-05-20 DIAGNOSIS — I429 Cardiomyopathy, unspecified: Secondary | ICD-10-CM | POA: Diagnosis not present

## 2015-05-20 NOTE — Progress Notes (Addendum)
Today, [name] exercised at [facility]. [facility]. Service time was from 1030 to 1210.  The patient exercised by performing aerobic, strengthening, and stretching exercises. Oxygen saturation, heart rate, blood pressure, rate of perceived exertion, and shortness of breath were all monitored before, during, and after exercise. [name] presented with no problems at today's exercise session. [name] also attended an education session on warning signs and symptoms.  The patient did not have an increase in workload intensity during today's exercise session.  Pre-exercise vitals: . Weight kg: 86.4 . Liters of O2: ra . SpO2: 97 . HR: 71 . BP: 106/64 . CBG: 147  Exercise vitals: . Highest heartrate:  84 . Lowest oxygen saturation: ra . Highest blood pressure: 116/60 . Liters of 02: ra  Post-exercise vitals: . SpO2: 100 . HR: 70 . BP: 110/58 . Liters of O2: ra . CBG: 172  Dr. Rush Farmer, Medical Director Dr. Eliseo Squires is immediately available during today's Pulmonary Rehab session for [name] on 05/20/2015 at 1030 class time

## 2015-05-24 ENCOUNTER — Ambulatory Visit (INDEPENDENT_AMBULATORY_CARE_PROVIDER_SITE_OTHER): Payer: Medicare Other | Admitting: Internal Medicine

## 2015-05-24 ENCOUNTER — Other Ambulatory Visit: Payer: Self-pay

## 2015-05-24 ENCOUNTER — Encounter: Payer: Self-pay | Admitting: Internal Medicine

## 2015-05-24 ENCOUNTER — Ambulatory Visit (INDEPENDENT_AMBULATORY_CARE_PROVIDER_SITE_OTHER)
Admission: RE | Admit: 2015-05-24 | Discharge: 2015-05-24 | Disposition: A | Discharge status: Home / Self Care | Payer: Medicare Other | Source: Ambulatory Visit | Attending: Internal Medicine | Admitting: Internal Medicine

## 2015-05-24 VITALS — BP 128/72 | HR 60 | Temp 97.9°F | Resp 20 | Ht 69.0 in | Wt 197.8 lb

## 2015-05-24 DIAGNOSIS — R05 Cough: Secondary | ICD-10-CM

## 2015-05-24 DIAGNOSIS — J0100 Acute maxillary sinusitis, unspecified: Secondary | ICD-10-CM | POA: Insufficient documentation

## 2015-05-24 DIAGNOSIS — E1121 Type 2 diabetes mellitus with diabetic nephropathy: Secondary | ICD-10-CM | POA: Diagnosis not present

## 2015-05-24 DIAGNOSIS — R059 Cough, unspecified: Secondary | ICD-10-CM

## 2015-05-24 MED ORDER — TRADJENTA 5 MG PO TABS: 5.0000 mg | ORAL_TABLET | Freq: Every day | ORAL | Status: AC

## 2015-05-24 MED ORDER — AMOXICILLIN-POT CLAVULANATE 875-125 MG PO TABS: 1.0000 | ORAL_TABLET | Freq: Two times a day (BID) | ORAL | Status: AC

## 2015-05-24 MED ORDER — PRECISION XTRA BLOOD GLUCOSE VI STRP: ORAL_STRIP | Status: AC

## 2015-05-24 MED ORDER — PRECISION XTRA BLOOD GLUCOSE VI STRP: ORAL_STRIP | Status: DC

## 2015-05-24 MED ORDER — HYDROCODONE-HOMATROPINE 5-1.5 MG/5ML PO SYRP: 5.0000 mL | ORAL_SOLUTION | Freq: Three times a day (TID) | ORAL | Status: AC | PRN

## 2015-05-24 NOTE — Progress Notes (Signed)
Pre visit review using our clinic review tool, if applicable. No additional management support is needed unless otherwise documented below in the visit note. 

## 2015-05-24 NOTE — Progress Notes (Signed)
Subjective:  Patient ID: [name], male    DOB: [birth year]  Age: 74 y.o. MRN: [id number]  CC: Cough   HPI [name] presents for productive cough, runny nose, facial pain, and swollen lymph nodes in his neck for one month.  Outpatient Prescriptions Prior to Visit  Medication Sig Dispense Refill  . acetaminophen (TYLENOL) 325 MG tablet Take 2 tablets (650 mg total) by mouth every 4 (four) hours as needed for headache or mild pain.    Marland Kitchen amiodarone (PACERONE) 200 MG tablet Take 100 mg by mouth daily.  3  . aspirin 81 MG tablet Take 81 mg by mouth daily.      . carvedilol (COREG) 25 MG tablet Take 25 mg by mouth 2 (two) times daily with a meal.      . Cholecalciferol (VITAMIN D-3) 1000 UNITS CAPS Take 1,000 Units by mouth 2 (two) times daily.     Marland Kitchen COLCRYS 0.6 MG tablet Take 0.6 mg by mouth 2 (two) times daily as needed (for gout symptoms).     Marland Kitchen digoxin (LANOXIN) 0.125 MG tablet Take 125 mcg by mouth daily.      . Febuxostat 80 MG TABS Take 2 tablets by mouth 2 (two) times daily.     . folic acid (FOLVITE) 1 MG tablet Take 1 mg by mouth daily.    . furosemide (LASIX) 40 MG tablet Take 40 mg by mouth daily.     . Linaclotide (LINZESS) 145 MCG CAPS capsule Take 145 mcg by mouth daily as needed (constipation).    Marland Kitchen lisinopril (PRINIVIL,ZESTRIL) 5 MG tablet Take 5 mg by mouth daily.     . Methotrexate Sodium, PF, 50 MG/2ML SOLN Inject 0.8 mLs as directed once a week. Take on sundays    . omeprazole (PRILOSEC) 40 MG capsule Take 1 capsule (40 mg total) by mouth daily. 30 capsule 11  . simvastatin (ZOCOR) 20 MG tablet Take 10 mg by mouth daily.     . TECHLITE LANCETS MISC     . PRECISION XTRA TEST STRIPS test strip     . TRADJENTA 5 MG TABS tablet Take 1 tablet by mouth daily.  2   No facility-administered medications prior to visit.    ROS Review of Systems  Constitutional: Negative.  Negative for fever, chills, diaphoresis, appetite change and fatigue.  HENT: Positive for  congestion, postnasal drip, rhinorrhea and sinus pressure. Negative for nosebleeds, sneezing, sore throat, trouble swallowing and voice change.   Eyes: Negative.   Respiratory: Positive for cough. Negative for choking, chest tightness, shortness of breath, wheezing and stridor.   Cardiovascular: Negative.  Negative for chest pain, palpitations and leg swelling.  Gastrointestinal: Negative.  Negative for nausea, vomiting, abdominal pain, diarrhea, constipation and blood in stool.  Endocrine: Negative.   Genitourinary: Negative.   Musculoskeletal: Negative.  Negative for myalgias, back pain, joint swelling and arthralgias.  Skin: Negative.  Negative for color change and rash.  Allergic/Immunologic: Negative.   Neurological: Negative.  Negative for dizziness.  Hematological: Negative.  Negative for adenopathy. Does not bruise/bleed easily.  Psychiatric/Behavioral: Negative.     Objective:  BP 128/72 mmHg  Pulse 60  Temp(Src) 97.9 F (36.6 C) (Oral)  Resp 20  Ht 5\' 9"  (1.753 m)  Wt 197 lb 12 oz (89.699 kg)  BMI 29.19 kg/m2  SpO2 97%  BP Readings from Last 3 Encounters:  05/24/15 128/72  05/10/15 100/50  04/07/15 110/78    Wt Readings from Last 3 Encounters:  05/24/15 197 lb 12 oz (89.699 kg)  05/10/15 192 lb 3.2 oz (87.181 kg)  04/07/15 188 lb (85.276 kg)    Physical Exam  Constitutional: He is oriented to person, place, and time.  Non-toxic appearance. He does not have a sickly appearance. He does not appear ill. No distress.  HENT:  Nose: Rhinorrhea present. No mucosal edema, sinus tenderness or nasal septal hematoma. Right sinus exhibits maxillary sinus tenderness. Right sinus exhibits no frontal sinus tenderness. Left sinus exhibits no maxillary sinus tenderness and no frontal sinus tenderness.  Mouth/Throat: Oropharynx is clear and moist and mucous membranes are normal. Mucous membranes are not pale and not cyanotic. No oral lesions. No trismus in the jaw. No uvula  swelling. No oropharyngeal exudate, posterior oropharyngeal edema, posterior oropharyngeal erythema or tonsillar abscesses.  Eyes: Conjunctivae are normal. Right eye exhibits no discharge. Left eye exhibits no discharge. No scleral icterus.  Neck: Normal range of motion. Neck supple. No JVD present. No tracheal deviation present. No thyromegaly present.  Cardiovascular: Normal rate, regular rhythm, normal heart sounds and intact distal pulses.  Exam reveals no gallop and no friction rub.   No murmur heard. Pulmonary/Chest: Effort normal and breath sounds normal. No stridor. No respiratory distress. He has no wheezes. He has no rales. He exhibits no tenderness.  Abdominal: Soft. Bowel sounds are normal. He exhibits no distension and no mass. There is no tenderness. There is no rebound and no guarding.  Musculoskeletal: Normal range of motion. He exhibits no edema or tenderness.  Lymphadenopathy:       Head (right side): No submental, no submandibular, no tonsillar, no preauricular, no posterior auricular and no occipital adenopathy present.       Head (left side): No submental, no submandibular, no tonsillar, no preauricular, no posterior auricular and no occipital adenopathy present.    He has cervical adenopathy.       Right cervical: Superficial cervical adenopathy present. No deep cervical and no posterior cervical adenopathy present.      Left cervical: Superficial cervical adenopathy present. No deep cervical and no posterior cervical adenopathy present.    He has no axillary adenopathy.       Right: No inguinal, no supraclavicular and no epitrochlear adenopathy present.       Left: No inguinal, no supraclavicular and no epitrochlear adenopathy present.  There is mild bilateral anterior cervical lymphadenopathy. The lymph nodes are freely mobile and nontender. They measure about 2 cm on each side but slightly more prominent on the right than the left.  Neurological: He is oriented to person,  place, and time.  Skin: Skin is warm and dry. No rash noted. He is not diaphoretic. No erythema. No pallor.  Vitals reviewed.   Lab Results  Component Value Date   WBC 5.9 04/07/2015   HGB 11.4* 04/07/2015   HCT 36.1* 04/07/2015   PLT 196.0 04/07/2015   GLUCOSE 136* 04/07/2015   CHOL 117 08/13/2014   TRIG 48 08/13/2014   HDL 40 08/13/2014   LDLCALC 67 08/13/2014   ALT 47 12/14/2014   AST 32 12/14/2014   NA 140 04/07/2015   K 3.9 04/07/2015   CL 107 04/07/2015   CREATININE 1.64* 04/07/2015   BUN 40* 04/07/2015   CO2 26 04/07/2015   TSH 1.57 12/14/2014   PSA <0.01 ng/mL* 11/09/2008   INR 3.08* 09/02/2014   HGBA1C 5.7 04/07/2015   MICROALBUR 1.0 07/28/2014   Dg Chest 2 View  05/24/2015  CLINICAL DATA:  Cough. EXAM:  CHEST  2 VIEW COMPARISON:  March 09, 2015. FINDINGS: Stable cardiomegaly and central pulmonary vascular congestion is noted. Left-sided pacemaker is unchanged in position. No pneumothorax or pleural effusion is noted. No acute pulmonary disease is noted. Bony thorax is intact. IMPRESSION: Stable cardiomegaly and central pulmonary vascular congestion. No acute abnormality seen in the chest. Electronically Signed   By: Marijo Conception, M.D.   On: 05/24/2015 13:03   No results found.  Assessment & Plan:   [name] was seen today for cough.  Diagnoses and all orders for this visit:  Cough- his CXR is negative for PNA -     HYDROcodone-homatropine (HYCODAN) 5-1.5 MG/5ML syrup; Take 5 mLs by mouth every 8 (eight) hours as needed for cough. -     DG Chest 2 View; Future  Acute maxillary sinusitis, recurrence not specified- will treat the infection with Augmentin, will control the cough with hycodan  -     amoxicillin-clavulanate (AUGMENTIN) 875-125 MG tablet; Take 1 tablet by mouth 2 (two) times daily. -     HYDROcodone-homatropine (HYCODAN) 5-1.5 MG/5ML syrup; Take 5 mLs by mouth every 8 (eight) hours as needed for cough.  Other orders -     PRECISION XTRA TEST  STRIPS test strip; Test blood sugar 1-2 times daily  I have changed [name] PRECISION XTRA TEST STRIPS. I am also having him start on amoxicillin-clavulanate and HYDROcodone-homatropine. Additionally, I am having him maintain his aspirin, carvedilol, digoxin, furosemide, COLCRYS, lisinopril, methotrexate (PF), acetaminophen, simvastatin, Vitamin D-3, folic acid, Febuxostat, Linaclotide, omeprazole, amiodarone, and TECHLITE LANCETS.  Meds ordered this encounter  Medications  . PRECISION XTRA TEST STRIPS test strip    Sig: Test blood sugar 1-2 times daily    Dispense:  100 each    Refill:  1  . amoxicillin-clavulanate (AUGMENTIN) 875-125 MG tablet    Sig: Take 1 tablet by mouth 2 (two) times daily.    Dispense:  20 tablet    Refill:  1  . HYDROcodone-homatropine (HYCODAN) 5-1.5 MG/5ML syrup    Sig: Take 5 mLs by mouth every 8 (eight) hours as needed for cough.    Dispense:  120 mL    Refill:  0     Follow-up: Return in about 3 weeks (around 06/14/2015).  Scarlette Calico, MD

## 2015-05-24 NOTE — Patient Instructions (Signed)

## 2015-05-25 ENCOUNTER — Encounter (HOSPITAL_COMMUNITY)
Admission: RE | Admit: 2015-05-25 | Discharge: 2015-05-25 | Disposition: A | Discharge status: Home / Self Care | Payer: Medicare Other | Source: Ambulatory Visit | Attending: Internal Medicine | Admitting: Internal Medicine

## 2015-05-25 DIAGNOSIS — J984 Other disorders of lung: Secondary | ICD-10-CM | POA: Diagnosis not present

## 2015-05-25 NOTE — Progress Notes (Signed)
Today, [name] exercised at [facility]. [facility]. Service time was from 10:30am to 12:10pm.  The patient exercised by performing aerobic, strengthening, and stretching exercises. Oxygen saturation, heart rate, blood pressure, rate of perceived exertion, and shortness of breath were all monitored before, during, and after exercise. [name] presented with no problems at today's exercise session.  The patient did not have an increase in workload intensity during today's exercise session.  Pre-exercise vitals: . Weight kg: 86.1 . Liters of O2: ra . SpO2: 99 . HR: 69 . BP: 104/56 . CBG: 210  Exercise vitals: . Highest heartrate:  77 . Lowest oxygen saturation: 94 . Highest blood pressure: 110/68 . Liters of 02: ra  Post-exercise vitals: . SpO2: 99 . HR: 63 . BP: 92/66 . Liters of O2: ra . CBG: 129  Dr. Rush Farmer, Medical Director Dr. Marily Memos is immediately available during today's Pulmonary Rehab session for [name] on 05/25/15 at 10:30am class time

## 2015-05-27 ENCOUNTER — Encounter (HOSPITAL_COMMUNITY)
Admission: RE | Admit: 2015-05-27 | Discharge: 2015-05-27 | Disposition: A | Discharge status: Home / Self Care | Payer: Medicare Other | Source: Ambulatory Visit | Attending: Internal Medicine | Admitting: Internal Medicine

## 2015-05-27 ENCOUNTER — Ambulatory Visit: Payer: Medicare Other | Admitting: Internal Medicine

## 2015-05-27 DIAGNOSIS — J984 Other disorders of lung: Secondary | ICD-10-CM | POA: Diagnosis not present

## 2015-05-27 NOTE — Progress Notes (Signed)
Today, [name] exercised at [facility]. [facility]. Service time was from 1030 to 1215.  The patient exercised by performing aerobic, strengthening, and stretching exercises. Oxygen saturation, heart rate, blood pressure, rate of perceived exertion, and shortness of breath were all monitored before, during, and after exercise. [name] presented with no problems at today's exercise session. Patient attended the Anatomy and Physiology class today.  The patient did not have an increase in workload intensity during today's exercise session.  Pre-exercise vitals: . Weight kg: 86.4 . Liters of O2: ra . SpO2: 97 . HR: 64 . BP: 94/56 . CBG: 184  Exercise vitals: . Highest heartrate:  79 . Lowest oxygen saturation: 92 . Highest blood pressure: 124/64 . Liters of 02: ra  Post-exercise vitals: . SpO2: 99 . HR: 70 . BP: 100/70 . Liters of O2: ra . CBG: 150 Dr. Rush Farmer, Medical Director Dr. Frederic Jericho is immediately available during today's Pulmonary Rehab session for [place] on 05/27/2015  at 1030 class time.

## 2015-06-01 ENCOUNTER — Encounter (HOSPITAL_COMMUNITY)
Admission: RE | Admit: 2015-06-01 | Discharge: 2015-06-01 | Disposition: A | Discharge status: Home / Self Care | Payer: Medicare Other | Source: Ambulatory Visit | Attending: Internal Medicine | Admitting: Internal Medicine

## 2015-06-01 DIAGNOSIS — J984 Other disorders of lung: Secondary | ICD-10-CM | POA: Diagnosis not present

## 2015-06-01 NOTE — Progress Notes (Signed)
Today, [name] exercised at [facility]. [facility]. Service time was from 1030 to 1200.  The patient exercised by performing aerobic, strengthening, and stretching exercises. Oxygen saturation, heart rate, blood pressure, rate of perceived exertion, and shortness of breath were all monitored before, during, and after exercise. [name] presented with no problems at today's exercise session.  The patient did have an increase in workload intensity during today's exercise session.  Pre-exercise vitals: . Weight kg: 88.1 . Liters of O2: ra . SpO2: 98 . HR: 70 . BP: 112/64 . CBG: 205  Exercise vitals: . Highest heartrate:  79 . Lowest oxygen saturation: 92 . Highest blood pressure: 130/80 . Liters of 02: ra  Post-exercise vitals: . SpO2: 100 . HR: 71 . BP: 102/70 . Liters of O2: ra . CBG: 134  Dr. Rush Farmer, Medical Director Dr. Frederic Jericho is immediately available during today's Pulmonary Rehab session for [name] on 06/01/2015 at 1030 class time.

## 2015-06-03 ENCOUNTER — Encounter (HOSPITAL_COMMUNITY)
Admission: RE | Admit: 2015-06-03 | Discharge: 2015-06-03 | Disposition: A | Discharge status: Home / Self Care | Payer: Medicare Other | Source: Ambulatory Visit | Attending: Internal Medicine | Admitting: Internal Medicine

## 2015-06-03 ENCOUNTER — Telehealth: Payer: Self-pay | Admitting: Cardiology

## 2015-06-03 DIAGNOSIS — Z79899 Other long term (current) drug therapy: Secondary | ICD-10-CM

## 2015-06-03 DIAGNOSIS — M7989 Other specified soft tissue disorders: Secondary | ICD-10-CM

## 2015-06-03 DIAGNOSIS — R0600 Dyspnea, unspecified: Secondary | ICD-10-CM

## 2015-06-03 DIAGNOSIS — R1900 Intra-abdominal and pelvic swelling, mass and lump, unspecified site: Secondary | ICD-10-CM

## 2015-06-03 DIAGNOSIS — J984 Other disorders of lung: Secondary | ICD-10-CM | POA: Diagnosis not present

## 2015-06-03 NOTE — Telephone Encounter (Signed)
Called pt home number, wife answered, I requested patient to return call.

## 2015-06-03 NOTE — Telephone Encounter (Signed)
Increase lasix to 40 bid for 3 days and then resume 40 mg daily; bmet and bnp one week Kirk Ruths

## 2015-06-03 NOTE — Telephone Encounter (Signed)
°  New Prob   States pt is currently on Lasix but no longer responding to medication. Pt stated he is not peeing as he used to (x 4 days) and his weight is currently up in rehab today. No respiratory distress at this time and no peripheral edema. However, pt stated his abdomen is larger than usual.

## 2015-06-03 NOTE — Telephone Encounter (Signed)
Spoke to [employer] at [employer]. Reports pt had weight gain of 4-4.5 lbs w/in past week. Notes as below, not SOB/no resp distress.  She notes pt is historically compliant w/ medications & this has not changed.  He is not reporting any changes in diet, such as increased food intake, salt increase, fluid increase, etc.  Has mild LE non-pitting edema which Truddie Crumble states is not a new finding, however, pt notes he can feel abdominal fullness, which is new.  Decrease in urinary output noted by patient for past 4 days, compared to what he usually has.   Routing to Dr. Stanford Breed for recommendation of dose increase or med change.

## 2015-06-03 NOTE — Progress Notes (Signed)
[name] presented to his pulmonary rehab exercise session with a 4.4 lb weight gain in 1 week. [name] denies additional fluid or salt intake. Also denies additional caloric intake more than his norm. Patient states he is not voiding as much with his normal lasix dose, which began 4 days ago. Lungs clear, mild non-pitting edema noted to lower extremities. Abdomen distended. Patient feels this additional weight is in his belly. Vitals Stable, see today's exercise progress note. Denies any distress. Left message with Dr. Jacalyn Lefevre nurse. Patient discharged home in stable condition. Verbalized emergency symptoms of fluid overload prior to discharge. Will continue to follow.

## 2015-06-03 NOTE — Progress Notes (Signed)
Today, [name] exercised at [facility]. [facility]. Service time was from 1030 to 1215.  The patient exercised by performing aerobic, strengthening, and stretching exercises. Oxygen saturation, heart rate, blood pressure, rate of perceived exertion, and shortness of breath were all monitored before, during, and after exercise. [name] presented with a continued weight gain, abdominal distention, and the complaint of not urinating as frequently as he usually does with lasix at today's exercise session. See additional progress note. [name] also attended an education session with Jeanella Craze on [employer].  The patient did not have an increase in workload intensity during today's exercise session.  Pre-exercise vitals: . Weight kg: 88.0 . Liters of O2: ra . SpO2: 98 . HR: 65 . BP: 104/60 . CBG: 175  Exercise vitals: . Highest heartrate:  69 . Lowest oxygen saturation: 94 . Highest blood pressure: 102/72 . Liters of 02: ra  Post-exercise vitals: . SpO2: 96 . HR: 65 . BP: 100/70 . Liters of O2: ra . CBG: 134  Dr. Rush Farmer, Medical Director Dr. Frederic Jericho is immediately available during today's Pulmonary Rehab session for [name] on 06/03/2015 at 1030 class time.

## 2015-06-04 NOTE — Telephone Encounter (Signed)
Left message for patient to call.

## 2015-06-04 NOTE — Telephone Encounter (Signed)
Pt given instruction for dose increase on lasix x3 days, recheck labs in 1 week. Advised to call if further needs or new problems. Will get weight update from [place] on Tuesday.

## 2015-06-08 ENCOUNTER — Encounter (HOSPITAL_COMMUNITY)
Admission: RE | Admit: 2015-06-08 | Discharge: 2015-06-08 | Disposition: A | Discharge status: Home / Self Care | Payer: Medicare Other | Source: Ambulatory Visit | Attending: Internal Medicine | Admitting: Internal Medicine

## 2015-06-08 DIAGNOSIS — J984 Other disorders of lung: Secondary | ICD-10-CM | POA: Diagnosis not present

## 2015-06-08 NOTE — Progress Notes (Signed)
Today, [name] exercised at [facility]. [facility]. Service time was from 1030 to 1210.  The patient exercised by performing aerobic, strengthening, and stretching exercises. Oxygen saturation, heart rate, blood pressure, rate of perceived exertion, and shortness of breath were all monitored before, during, and after exercise. [name] presented with no problems at today's exercise session.  The patient did not have an increase in workload intensity during today's exercise session.  Pre-exercise vitals: . Weight kg: 86.0 . Liters of O2: RA . SpO2: 99 . HR: 68 . BP: 102/60 . CBG: 170  Exercise vitals: . Highest heartrate:  75 . Lowest oxygen saturation: 96 . Highest blood pressure: 118/70 . Liters of 02: RA  Post-exercise vitals: . SpO2: 99 . HR: 63 . BP: 102/60 . Liters of O2: RA . CBG: 151 Dr. Rush Farmer, Medical Director Dr. Marily Memos is immediately available during today's Pulmonary Rehab session for [name] on 06/08/2015  at 1030 class time  .

## 2015-06-10 ENCOUNTER — Encounter (HOSPITAL_COMMUNITY)
Admission: RE | Admit: 2015-06-10 | Discharge: 2015-06-10 | Disposition: A | Discharge status: Home / Self Care | Payer: Medicare Other | Source: Ambulatory Visit | Attending: Internal Medicine | Admitting: Internal Medicine

## 2015-06-10 DIAGNOSIS — J984 Other disorders of lung: Secondary | ICD-10-CM | POA: Diagnosis not present

## 2015-06-10 NOTE — Progress Notes (Signed)
Today, [name] exercised at [facility]. [facility]. Service time was from 10:30am to 12:00pm.  The patient exercised by performing aerobic, strengthening, and stretching exercises. Oxygen saturation, heart rate, blood pressure, rate of perceived exertion, and shortness of breath were all monitored before, during, and after exercise. [name] presented with no problems at today's exercise session. The patient attended education today with [facility] [name] on Pursed Lip and Diaphragmatic Breathing.  The patient did not have an increase in workload intensity during today's exercise session.  Pre-exercise vitals: . Weight kg: 86.8 . Liters of O2: ra . SpO2: 99 . HR: 68 . BP: 124/76 . CBG: 112  Exercise vitals: . Highest heartrate:  78 . Lowest oxygen saturation: 91 . Highest blood pressure: 114/60 . Liters of 02: ra  Post-exercise vitals: . SpO2: 97 . HR: 69 . BP: 124/64 . Liters of O2: ra . CBG: 218  Dr. Rush Farmer, Medical Director [name] is immediately available during today's Pulmonary Rehab session for [place] on 06/10/15 at 10:30am class time.

## 2015-06-15 ENCOUNTER — Other Ambulatory Visit (INDEPENDENT_AMBULATORY_CARE_PROVIDER_SITE_OTHER): Payer: Medicare Other | Admitting: *Deleted

## 2015-06-15 ENCOUNTER — Encounter (HOSPITAL_COMMUNITY)
Admission: RE | Admit: 2015-06-15 | Discharge: 2015-06-15 | Disposition: A | Discharge status: Home / Self Care | Payer: Medicare Other | Source: Ambulatory Visit | Attending: Internal Medicine | Admitting: Internal Medicine

## 2015-06-15 DIAGNOSIS — I472 Ventricular tachycardia: Secondary | ICD-10-CM | POA: Diagnosis not present

## 2015-06-15 DIAGNOSIS — I340 Nonrheumatic mitral (valve) insufficiency: Secondary | ICD-10-CM

## 2015-06-15 DIAGNOSIS — E785 Hyperlipidemia, unspecified: Secondary | ICD-10-CM | POA: Diagnosis not present

## 2015-06-15 DIAGNOSIS — I5022 Chronic systolic (congestive) heart failure: Secondary | ICD-10-CM | POA: Diagnosis not present

## 2015-06-15 DIAGNOSIS — I420 Dilated cardiomyopathy: Secondary | ICD-10-CM

## 2015-06-15 DIAGNOSIS — I10 Essential (primary) hypertension: Secondary | ICD-10-CM | POA: Diagnosis not present

## 2015-06-15 DIAGNOSIS — I4729 Other ventricular tachycardia: Secondary | ICD-10-CM

## 2015-06-15 DIAGNOSIS — J984 Other disorders of lung: Secondary | ICD-10-CM | POA: Diagnosis not present

## 2015-06-15 LAB — BASIC METABOLIC PANEL
BUN: 39 mg/dL — AB (ref 7–25)
CALCIUM: 8.8 mg/dL (ref 8.6–10.3)
CO2: 20 mmol/L (ref 20–31)
CREATININE: 1.85 mg/dL — AB (ref 0.70–1.18)
Chloride: 110 mmol/L (ref 98–110)
GLUCOSE: 118 mg/dL — AB (ref 65–99)
Potassium: 3.6 mmol/L (ref 3.5–5.3)
Sodium: 141 mmol/L (ref 135–146)

## 2015-06-15 NOTE — Addendum Note (Signed)
Addended by: Eulis Foster on: 06/15/2015 12:10 PM   Modules accepted: Orders

## 2015-06-15 NOTE — Progress Notes (Signed)
Bronislaus completed a Six-Minute Walk Test on 06/15/15 . Andriel walked 952 feet with 0 breaks.  The patient's lowest oxygen saturation was 94 %, highest heart rate was 96 bpm , and highest blood pressure was 144/80. The patient was on room air. Patient stated that nothing hindered their walk test.

## 2015-06-16 LAB — BRAIN NATRIURETIC PEPTIDE: BRAIN NATRIURETIC PEPTIDE: 367.7 pg/mL — AB (ref 0.0–100.0)

## 2015-06-17 ENCOUNTER — Encounter (HOSPITAL_COMMUNITY)
Admission: RE | Admit: 2015-06-17 | Discharge: 2015-06-17 | Disposition: A | Discharge status: Home / Self Care | Payer: Medicare Other | Source: Ambulatory Visit | Attending: Internal Medicine | Admitting: Internal Medicine

## 2015-06-17 DIAGNOSIS — J984 Other disorders of lung: Secondary | ICD-10-CM | POA: Diagnosis not present

## 2015-06-17 NOTE — Progress Notes (Signed)
Today, Kaoru exercised at [facility]. [facility]. Service time was from 10:30am to 12:20pm.  The patient exercised by performing aerobic, strengthening, and stretching exercises. Oxygen saturation, heart rate, blood pressure, rate of perceived exertion, and shortness of breath were all monitored before, during, and after exercise. Keen presented with no problems at today's exercise session. The patient attended education class on Nutrition.  The patient did not have an increase in workload intensity during today's exercise session.  Pre-exercise vitals: . Weight kg: 86.7 . Liters of O2: ra . SpO2: 97 . HR: 69 . BP: 104/60 . CBG: 269  Exercise vitals: . Highest heartrate:  80 . Lowest oxygen saturation: 94 . Highest blood pressure: 124/70 . Liters of 02: ra  Post-exercise vitals: . SpO2: 94 . HR: 71 . BP: 102/62 . Liters of O2: ra . CBG: 186  Dr. Rush Farmer, Medical Director Dr. Darrick Meigs is immediately available during today's Pulmonary Rehab session for [place] on 06/17/15 at 10:30am class time

## 2015-06-21 ENCOUNTER — Emergency Department (HOSPITAL_COMMUNITY)
Admission: EM | Admit: 2015-06-21 | Discharge: 2015-07-21 | Disposition: E | Payer: Medicare Other | Attending: Emergency Medicine | Admitting: Emergency Medicine

## 2015-06-21 DIAGNOSIS — Z79899 Other long term (current) drug therapy: Secondary | ICD-10-CM | POA: Insufficient documentation

## 2015-06-21 DIAGNOSIS — Z87891 Personal history of nicotine dependence: Secondary | ICD-10-CM | POA: Insufficient documentation

## 2015-06-21 DIAGNOSIS — Z8709 Personal history of other diseases of the respiratory system: Secondary | ICD-10-CM | POA: Diagnosis not present

## 2015-06-21 DIAGNOSIS — E119 Type 2 diabetes mellitus without complications: Secondary | ICD-10-CM | POA: Insufficient documentation

## 2015-06-21 DIAGNOSIS — I469 Cardiac arrest, cause unspecified: Secondary | ICD-10-CM | POA: Insufficient documentation

## 2015-06-21 DIAGNOSIS — E669 Obesity, unspecified: Secondary | ICD-10-CM | POA: Diagnosis not present

## 2015-06-21 DIAGNOSIS — I10 Essential (primary) hypertension: Secondary | ICD-10-CM | POA: Insufficient documentation

## 2015-06-21 DIAGNOSIS — Z95810 Presence of automatic (implantable) cardiac defibrillator: Secondary | ICD-10-CM | POA: Insufficient documentation

## 2015-06-21 DIAGNOSIS — Z8546 Personal history of malignant neoplasm of prostate: Secondary | ICD-10-CM | POA: Diagnosis not present

## 2015-06-21 DIAGNOSIS — M1990 Unspecified osteoarthritis, unspecified site: Secondary | ICD-10-CM | POA: Diagnosis not present

## 2015-06-21 DIAGNOSIS — E785 Hyperlipidemia, unspecified: Secondary | ICD-10-CM | POA: Diagnosis not present

## 2015-06-21 DIAGNOSIS — Z7982 Long term (current) use of aspirin: Secondary | ICD-10-CM | POA: Insufficient documentation

## 2015-06-21 DIAGNOSIS — Z86718 Personal history of other venous thrombosis and embolism: Secondary | ICD-10-CM | POA: Insufficient documentation

## 2015-06-21 LAB — I-STAT CHEM 8, ED
BUN: 45 mg/dL — ABNORMAL HIGH (ref 6–20)
Calcium, Ion: 1.09 mmol/L — ABNORMAL LOW (ref 1.13–1.30)
Chloride: 112 mmol/L — ABNORMAL HIGH (ref 101–111)
Creatinine, Ser: 1.6 mg/dL — ABNORMAL HIGH (ref 0.61–1.24)
Glucose, Bld: 164 mg/dL — ABNORMAL HIGH (ref 65–99)
HEMATOCRIT: 37 % — AB (ref 39.0–52.0)
HEMOGLOBIN: 12.6 g/dL — AB (ref 13.0–17.0)
Potassium: 4.2 mmol/L (ref 3.5–5.1)
SODIUM: 146 mmol/L — AB (ref 135–145)
TCO2: 20 mmol/L (ref 0–100)

## 2015-06-21 MED ORDER — CALCIUM CHLORIDE 10 % IV SOLN
INTRAVENOUS | Status: AC | PRN
  Administered 2015-06-21: 1 g via INTRAVENOUS

## 2015-06-21 MED ORDER — EPINEPHRINE HCL 0.1 MG/ML IJ SOSY
PREFILLED_SYRINGE | INTRAMUSCULAR | Status: AC | PRN
  Administered 2015-06-21 (×7): 1 mg via INTRAVENOUS

## 2015-06-21 MED ORDER — ATROPINE SULFATE 1 MG/ML IJ SOLN
INTRAMUSCULAR | Status: AC | PRN
  Administered 2015-06-21 (×2): 1 mg via INTRAVENOUS

## 2015-06-21 MED ORDER — VASOPRESSIN 20 UNIT/ML IV SOLN
0.0300 [IU]/min | INTRAVENOUS | Status: DC
  Administered 2015-06-21: 0.03 [IU]/min via INTRAVENOUS
  Filled 2015-06-21: qty 2

## 2015-06-21 MED ORDER — TENECTEPLASE 50 MG IV KIT
50.0000 mg | PACK | Freq: Once | INTRAVENOUS | Status: AC
  Administered 2015-06-21: 50 mg via INTRAVENOUS
  Filled 2015-06-21: qty 10

## 2015-06-21 MED ORDER — MAGNESIUM SULFATE 50 % IJ SOLN
INTRAMUSCULAR | Status: AC | PRN
  Administered 2015-06-21: 2 g via INTRAVENOUS

## 2015-06-21 MED ORDER — SODIUM BICARBONATE 8.4 % IV SOLN
INTRAVENOUS | Status: AC | PRN
  Administered 2015-06-21 (×3): 50 meq via INTRAVENOUS

## 2015-06-21 MED ORDER — NOREPINEPHRINE BITARTRATE 1 MG/ML IV SOLN
0.0000 ug/min | INTRAVENOUS | Status: DC
  Administered 2015-06-21: 37.333 ug/min via INTRAVENOUS
  Filled 2015-06-21: qty 4

## 2015-06-21 NOTE — Code Documentation (Signed)
Pulse check, CPR

## 2015-06-21 NOTE — Code Documentation (Signed)
Pt was eating dinner, starting vomiting bright red blood, family stared CPR at 8pm until firefighters arrived. PTA pt received, 11 epi, 450 amiodarone, 2 mg glucagon, and epi drip. Pulses back in route and then pt went back into cardiac arrest.

## 2015-06-21 NOTE — Code Documentation (Signed)
Pulse check.   

## 2015-06-21 NOTE — ED Provider Notes (Signed)
CSN: [id number]     Arrival date & time 06/20/2015  2107 History   First MD Initiated Contact with Patient 07/05/2015 2129     Chief Complaint  Patient presents with  . Cardiac Arrest   75 yo AAM with "a bad heart" per family who presents by EMS in cardiac arrest. He was eating dinner with family when he suddenly collapsed. Family reported seeing blood in his mouth. Fire dept arrived and started compressions at 8:20PM. EMS then arrived, found him in PEA, placed ETT airway and transported pt to our ED. In route, pt had several episodes of ROSC w/bradycardia in the 40s, but would soon lose pulses and require CPR again. Has rec'd 11 rounds of epi and 2 of amiodarone so far. This is all the information that we know currently. Family is said to be on the way.   (Consider location/radiation/quality/duration/timing/severity/associated sxs/prior Treatment) Patient is a 75 y.o. male presenting with general illness.  Illness Location:  Cardiac arrest Severity:  Severe Onset quality:  Sudden Timing:  Constant Chronicity:  New   Past Medical History  Diagnosis Date  . Obesity   . Prostate cancer [facility])     s/p prostatectomy  . Bursitis of elbow     left  . Osteoarthritis   . HTN (hypertension)   . HLD (hyperlipidemia)   . History of DVT (deep vein thrombosis) March 2016    Left Axillary DVT  . Chronic systolic heart failure (HCC)     echo 4/13: EF 25-30%, mod MR, mod LAE, PASP 47  . NICM (nonischemic cardiomyopathy) ([place])     normal cors by cath in 1990s;  nuclear 8/08: no ischemia; inf/lat/dist ant/apical scar  . Moderate mitral regurgitation     echo 4/13  . Ventricular tachycardia ([place]) 07/2014  . Diabetes mellitus without complication ([place])   . Restrictive lung disease    Past Surgical History  Procedure Laterality Date  . Transurethral resection of prostate    . Cardiac defibrillator placement  10/09    STJ single chamber ICD   . Hernia repair    . Robot assisted laparoscopic radical  prostatectomy  6/08  . Left and right heart catheterization with coronary angiogram N/A 08/13/2014    Procedure: LEFT AND RIGHT HEART CATHETERIZATION WITH CORONARY ANGIOGRAM;  Surgeon: Sinclair Grooms, MD;  Location: [facility] CATH LAB;  Service: Cardiovascular;  Laterality: N/A;   Family History  Problem Relation Age of Onset  . Prostate cancer Father   . Heart disease Mother   . Lung disease Neg Hx   . Rheumatologic disease Neg Hx   . Heart disease Son   . Renal Disease Son    Social History  Substance Use Topics  . Smoking status: Former Smoker -- 0.50 packs/day for 10 years    Types: Cigarettes    Start date: 02/17/1966    Quit date: 10/18/1975  . Smokeless tobacco: Former Systems developer    Quit date: 11/12/1975  . Alcohol Use: No     Comment: Remote use over 5 years ago    Review of Systems  Unable to perform ROS: Acuity of condition      Allergies  Spironolactone  Home Medications   Prior to Admission medications   Medication Sig Start Date End Date Taking? Authorizing Provider  acetaminophen (TYLENOL) 325 MG tablet Take 2 tablets (650 mg total) by mouth every 4 (four) hours as needed for headache or mild pain. 08/16/14   Erlene Quan, PA-C  amiodarone (  PACERONE) 200 MG tablet Take 100 mg by mouth daily. 01/06/15   Historical Provider, MD  aspirin 81 MG tablet Take 81 mg by mouth daily.      Historical Provider, MD  carvedilol (COREG) 25 MG tablet Take 25 mg by mouth 2 (two) times daily with a meal.      Historical Provider, MD  Cholecalciferol (VITAMIN D-3) 1000 UNITS CAPS Take 1,000 Units by mouth 2 (two) times daily.     Historical Provider, MD  COLCRYS 0.6 MG tablet Take 0.6 mg by mouth 2 (two) times daily as needed (for gout symptoms).  03/11/14   Historical Provider, MD  digoxin (LANOXIN) 0.125 MG tablet Take 125 mcg by mouth daily.      Historical Provider, MD  Febuxostat 80 MG TABS Take 2 tablets by mouth 2 (two) times daily.     Historical Provider, MD  folic acid (FOLVITE)  1 MG tablet Take 1 mg by mouth daily.    Historical Provider, MD  furosemide (LASIX) 40 MG tablet Take 40 mg by mouth daily.  09/14/11   Thompson Grayer, MD  HYDROcodone-homatropine (HYCODAN) 5-1.5 MG/5ML syrup Take 5 mLs by mouth every 8 (eight) hours as needed for cough. 05/24/15   Janith Lima, MD  Linaclotide [facility]) 145 MCG CAPS capsule Take 145 mcg by mouth daily as needed (constipation).    Historical Provider, MD  lisinopril (PRINIVIL,ZESTRIL) 5 MG tablet Take 5 mg by mouth daily.     Historical Provider, MD  Methotrexate Sodium, PF, 50 MG/2ML SOLN Inject 0.8 mLs as directed once a week. Take on sundays    Historical Provider, MD  omeprazole (PRILOSEC) 40 MG capsule Take 1 capsule (40 mg total) by mouth daily. 10/12/14   Janith Lima, MD  PRECISION XTRA TEST STRIPS test strip Test blood sugar 1-2 times daily 05/24/15   Janith Lima, MD  simvastatin (ZOCOR) 20 MG tablet Take 10 mg by mouth daily.     Historical Provider, MD  TECHLITE LANCETS [place]  01/06/15   Historical Provider, MD  TRADJENTA 5 MG TABS tablet Take 1 tablet (5 mg total) by mouth daily. 05/24/15   Janith Lima, MD   BP 55/27 mmHg  Pulse 43  Resp 11  Wt 89.359 kg Physical Exam  Constitutional: He appears well-developed. He appears distressed.  cpr in progress  HENT:  Head: Normocephalic and atraumatic.  Eyes:  Pupils 29mm bilaterally, unresponsive  Cardiovascular:  Pea arrest, lucas delivering compressions  Pulmonary/Chest:  Intubated-bilateral sounds, no sounds over gastrium  Musculoskeletal: He exhibits no edema.  Skin: Skin is dry. No rash noted.    ED Course  .Critical Care Performed by: Sherian Maroon Authorized by: Sherian Maroon Total critical care time: 45 minutes Critical care was necessary to treat or prevent imminent or life-threatening deterioration of the following conditions: cardiac failure. Critical care was time spent personally by me on the following activities: ordering and performing treatments  and interventions, re-evaluation of patient's condition, evaluation of patient's response to treatment, ordering and review of laboratory studies, examination of patient and transcutaneous pacing.   (including critical care time) Labs Review Labs Reviewed  I-STAT CHEM 8, ED - Abnormal; Notable for the following:    Sodium 146 (*)    Chloride 112 (*)    BUN 45 (*)    Creatinine, Ser 1.60 (*)    Glucose, Bld 164 (*)    Calcium, Ion 1.09 (*)    Hemoglobin 12.6 (*)    HCT  37.0 (*)    All other components within normal limits    Imaging Review No results found. I have personally reviewed and evaluated these images and lab results as part of my medical decision-making.   EKG Interpretation None      MDM   Final diagnoses:  Cardiac arrest [facility])   75 yo M w/cardiac arrest and PEA. See HPI for backstory. On arrival, CPR in progress. Given report of wide rhythm on cardiac rhythm strip by transport team, considering e- derangments or hyperK. Given 2 amps bicarb and magnesium and calcium in addition to ACLS epinephrine. Pt occasionally would obtain ROSC w/brady in the 47s. BSUS showed very hypokinetic myocardium with scant pericardial effusion. During his ROSC episodes w/bradycardia, transthoracic pacing was also attempted w/little success in capturing him. He was started on levophed gtt, vasopressin gtt and had boluses of atropine. This was successful in increasing his BP from the 123456 systolics to 123XX123 systolics. However, this was only transitory and pt would again lose pulses.   Stat labs reveal no electrolyte abnormalities.   With compressions, has ETCO2 in the 20s. With ROSC, cardiac output is so low that ETCO2 drops off.   Considered massive PE and massive MI. Could also be head bleed. Tried TNKase given possible massive clot, but had no change in outcome.   AFter countless rounds of CPR, family arrived. I personally discussed with them his critical status and likely poor outcome. Even  if the pt were to improve now, at this point, with 2 hours of CPR, his chance at meaningful recovery was almost nonexistent. Family understood and decided to come to the bedside.   At 10:08 pm, family at bedside and decided to halt efforts. Lukas device turned off. Monitor turned off. At 10:18, pulseless. TOD called.   Pt was seen under the supervision of Dr. Dayna Barker.     Sherian Maroon, MD 06/25/2015 2357  Merrily Pew, MD 06/24/15 2018

## 2015-06-21 NOTE — Code Documentation (Signed)
CPR start 

## 2015-06-21 NOTE — Code Documentation (Signed)
Family at beside. Family given emotional support. 

## 2015-06-21 NOTE — Code Documentation (Addendum)
Family arrived, HR in 40's, decided to stop interventions and withdraw care.

## 2015-06-21 NOTE — Code Documentation (Signed)
Pulse check CPR

## 2015-06-21 NOTE — Code Documentation (Addendum)
Pulse check.   

## 2015-06-21 NOTE — Code Documentation (Signed)
CPR restarted.

## 2015-06-21 NOTE — Progress Notes (Signed)
Pt. Died in the ED. I provided support and prayer for the family. I accompanied them into the trauma bay to be their loved one shortly before death. I prayed with them as the pt. Expired. In addition, I obtained their personal information; phone number, address and gave it to the nurse.

## 2015-06-21 NOTE — Code Documentation (Signed)
Pulse check, Paced

## 2015-06-21 NOTE — Code Documentation (Addendum)
Pulse check, pulse 40's and paced

## 2015-06-22 ENCOUNTER — Encounter (HOSPITAL_COMMUNITY): Payer: Medicare Other

## 2015-06-24 ENCOUNTER — Encounter (HOSPITAL_COMMUNITY): Payer: Medicare Other

## 2015-06-24 ENCOUNTER — Telehealth: Payer: Self-pay

## 2015-06-24 NOTE — Telephone Encounter (Signed)
On 06/24/2015 I received a death certificate from [place] (original). The death certificate is for burial. The patient is a patient of Doctor Scarlette Calico. The death certificate will be taken to Primary Care @ [facility] this pm for signature.  On Jul 01, 2015 I received the death certificate back from Doctor Ronnald Ramp. I got the death certificate ready and called the funeral home to let them know the death certificate is ready for pickup.

## 2015-06-29 ENCOUNTER — Encounter (HOSPITAL_COMMUNITY): Payer: Medicare Other

## 2015-07-21 NOTE — ED Notes (Signed)
MD notified of need to contact PCP and notify of death.

## 2015-07-21 NOTE — ED Notes (Addendum)
Resident spoke with person on call with [facility] cardiology about need to sign death certificate, primary care to sign in the AM.

## 2015-07-21 DEATH — deceased

## 2015-07-26 ENCOUNTER — Other Ambulatory Visit: Payer: Medicare Other

## 2016-05-25 IMAGING — CR DG ABDOMEN ACUTE W/ 1V CHEST
3 series · 3 of 3 positions shown · non-contrast
Comparison: None.

CLINICAL DATA: Right left lower quadrant pain.

EXAM:
ACUTE ABDOMEN SERIES (ABDOMEN 2 VIEW & CHEST 1 VIEW)

[view not recorded (1 of 3)]
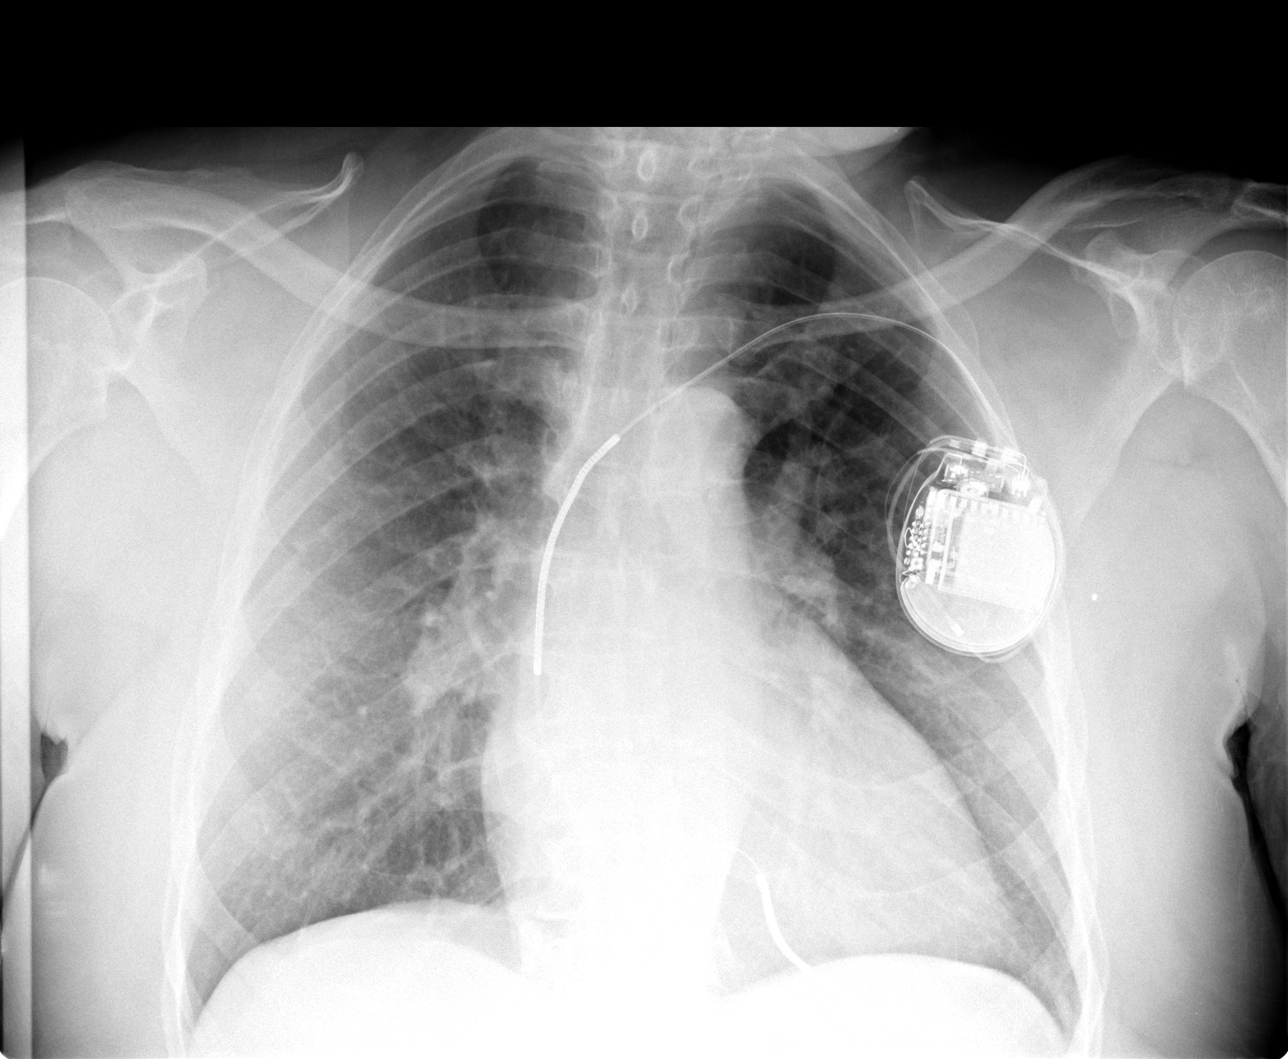

[view not recorded (2 of 3)]
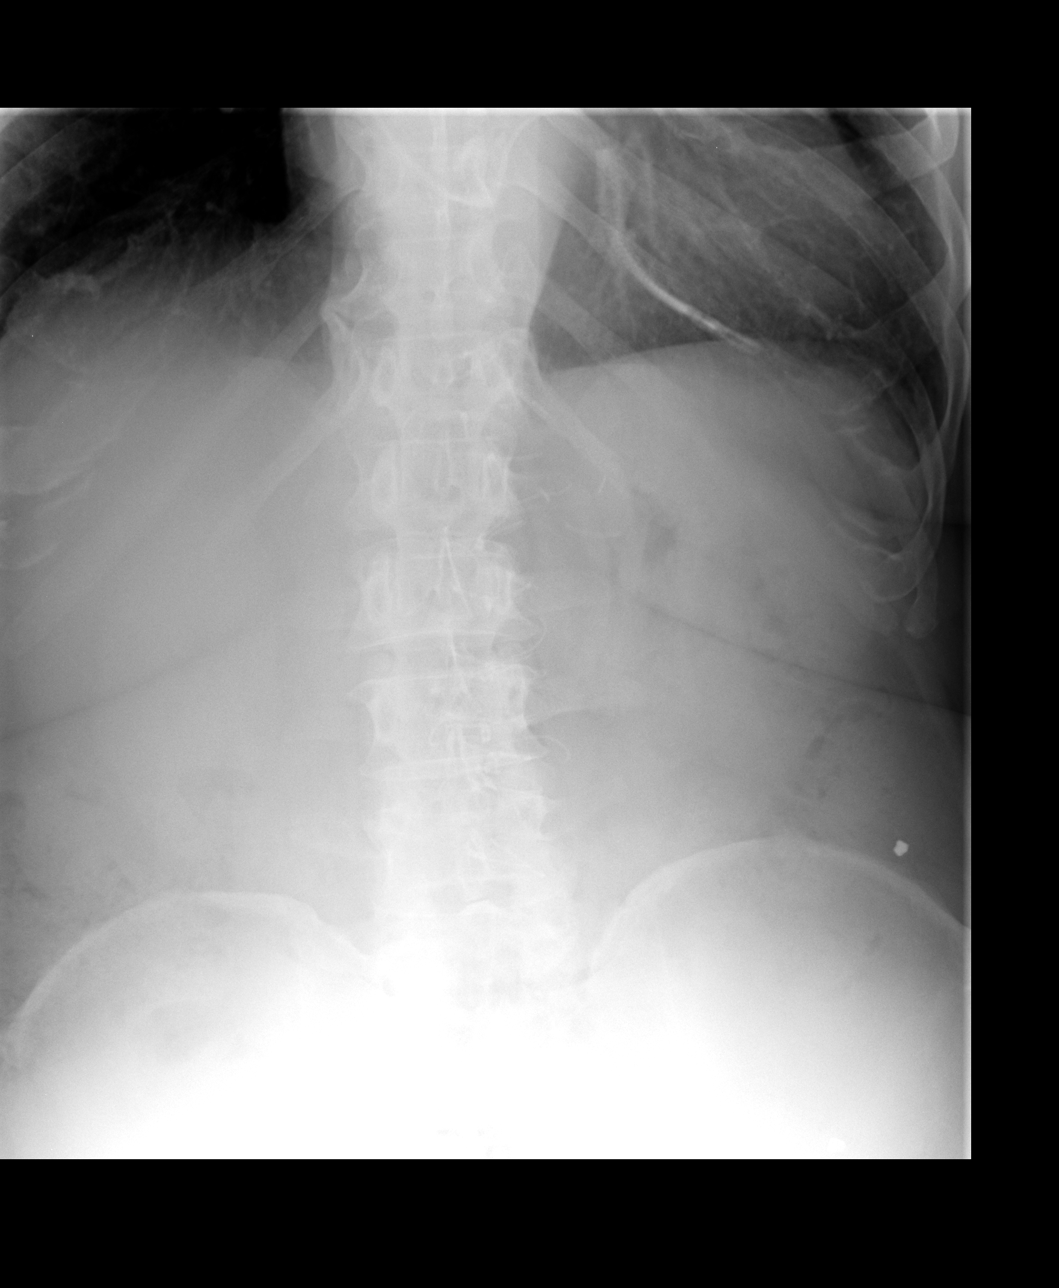

[view not recorded (3 of 3)]
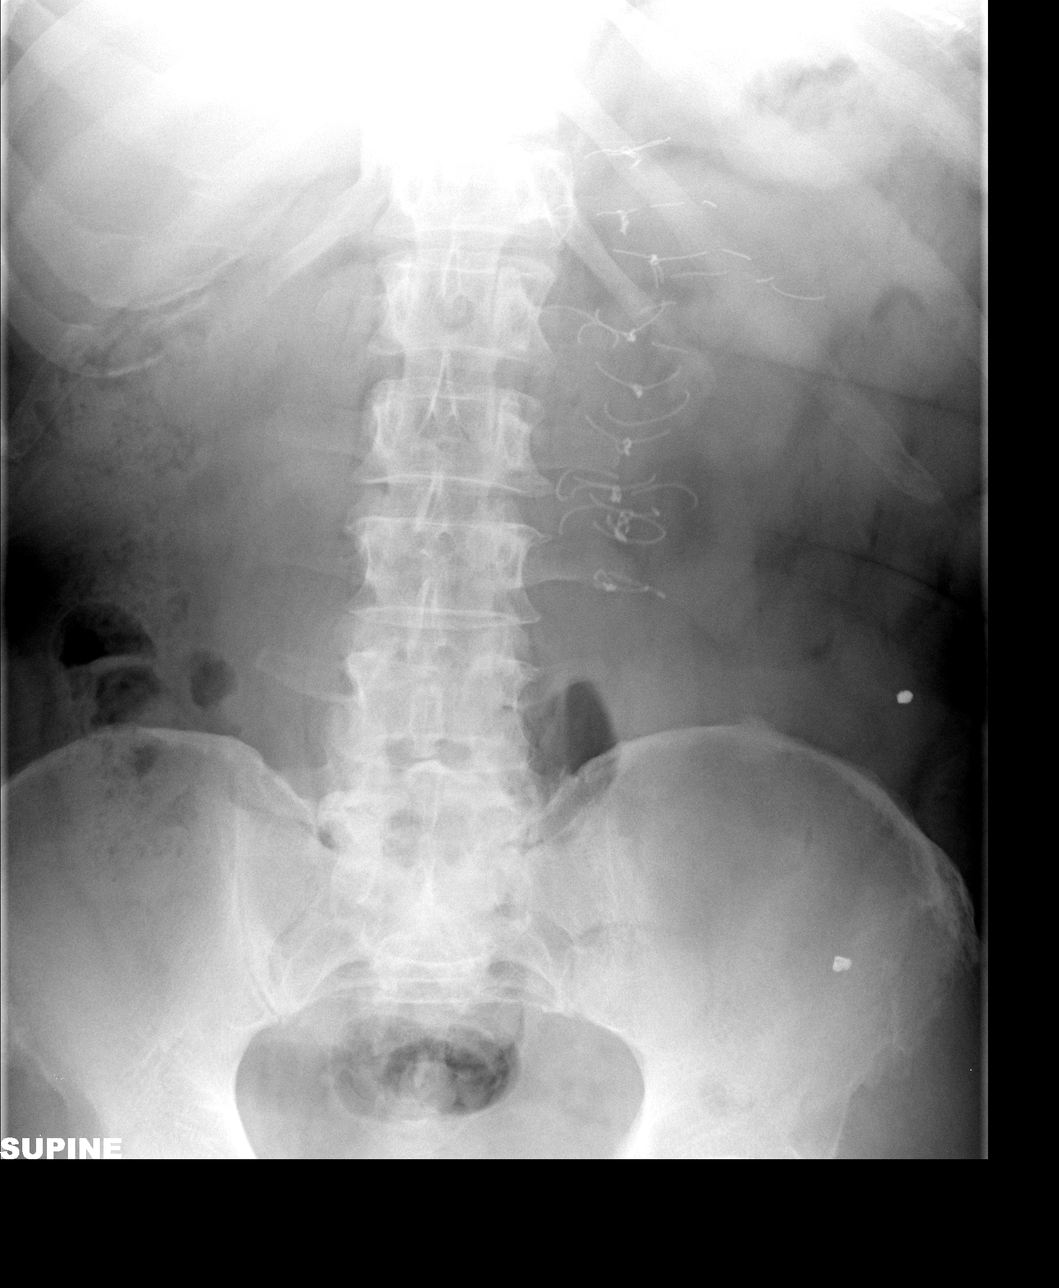

[3 of 3 positions shown; findings below may reference images not displayed]

FINDINGS: Cardiomegaly. Cardiac pacer noted with tip projected over right
ventricle. No focal pulmonary infiltrate.

Soft tissue structures of the abdomen are unremarkable. The gas
pattern is nonspecific. Surgical wiring noted over the abdomen.
Surgical wires are fractured. Metallic densities noted over the left
abdomen. Degenerative changes lumbar spine.
IMPRESSION: 1. Cardiomegaly.  Cardiac pacer with lead tip in right ventricle.
2. Postsurgical changes in the abdomen. Metallic densities noted of
the left lower abdomen. No bowel distention or free air.

## 2016-06-20 IMAGING — CR DG CHEST 2V
2 series · 2 of 2 positions shown · non-contrast
Comparison: 07/28/2014

CLINICAL DATA: Shortness of breath

EXAM:
CHEST  2 VIEW

[chest pa]
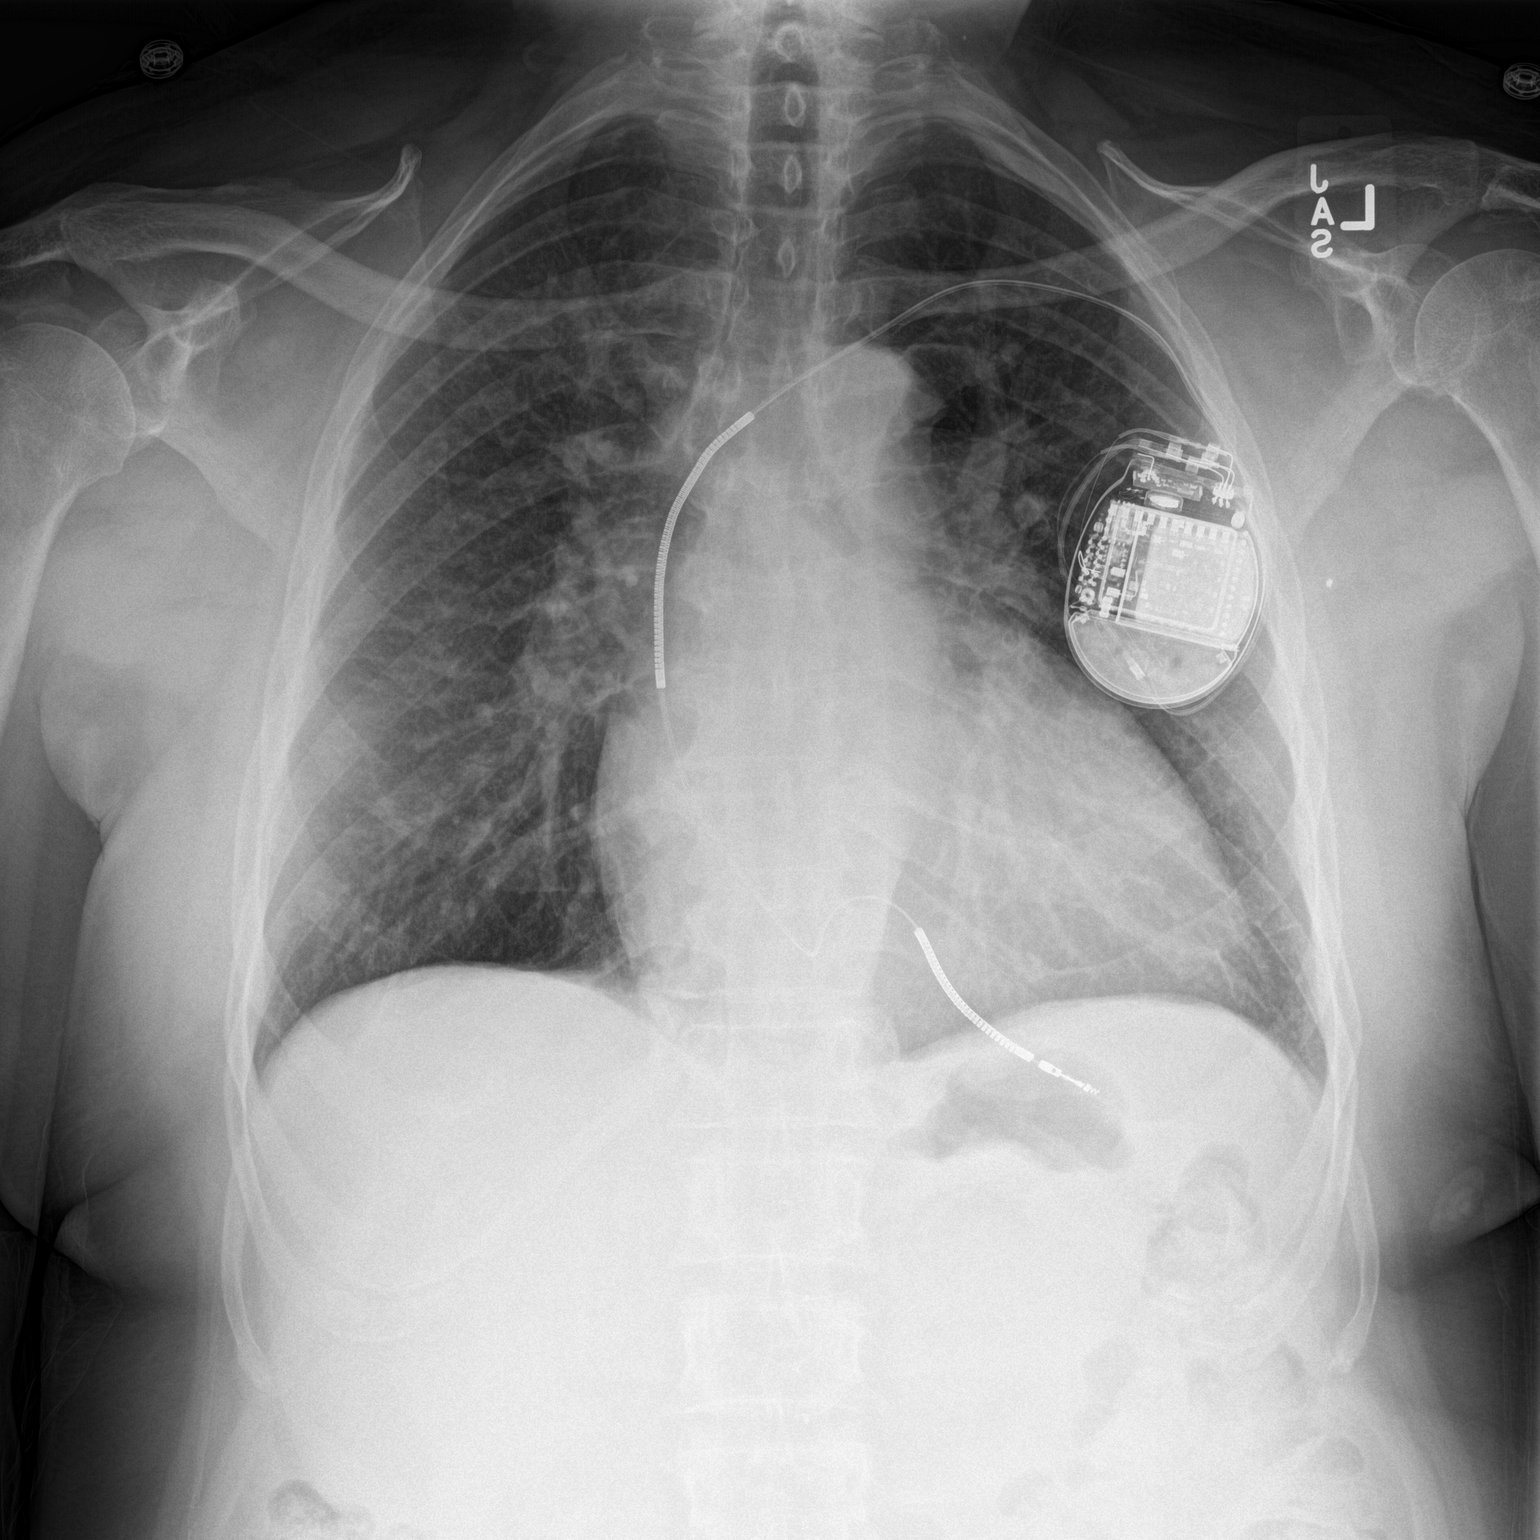

[chest lat]
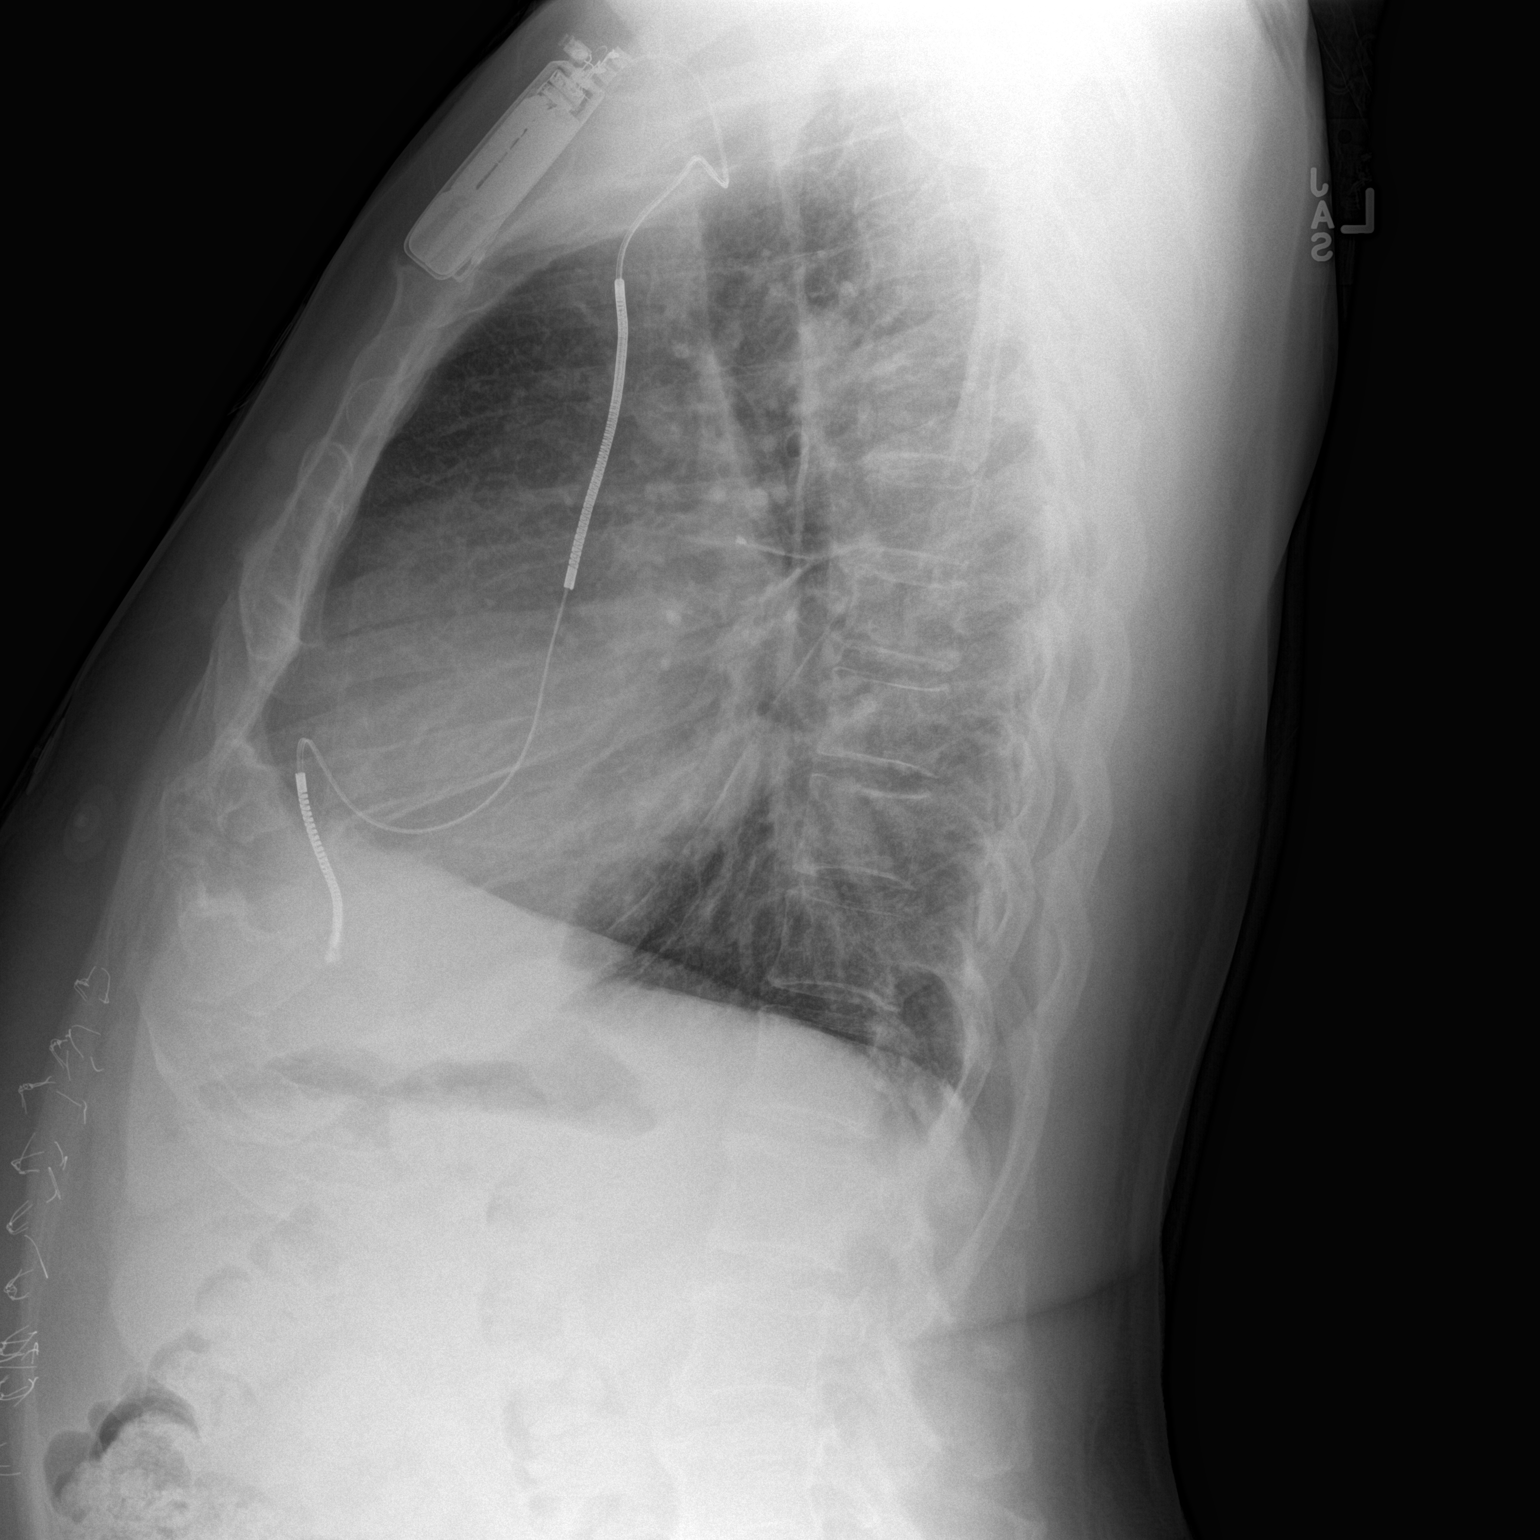

[2 of 2 positions shown; findings below may reference images not displayed]

FINDINGS: Stable cardiomegaly. A mildly redundant single chamber ICD/ pacer
lead into the right ventricle has an unchanged appearance. Mild
aortic tortuosity is also stable.

There is pulmonary venous congestion without edema. No effusion or
pneumothorax. No evidence for pneumonia.
IMPRESSION: Cardiomegaly and pulmonary venous hypertension.
